# Patient Record
Sex: Female | Born: 1965 | Race: Black or African American | Hispanic: No | Smoking: Never smoker
Health system: Southern US, Community
[De-identification: ages and names within clinical notes are randomized; demographics above are authoritative.]

## PROBLEM LIST (undated history)

## (undated) DIAGNOSIS — R0683 Snoring: Secondary | ICD-10-CM

## (undated) DIAGNOSIS — R809 Proteinuria, unspecified: Secondary | ICD-10-CM

## (undated) DIAGNOSIS — N8003 Adenomyosis of the uterus: Secondary | ICD-10-CM

## (undated) DIAGNOSIS — Z8742 Personal history of other diseases of the female genital tract: Secondary | ICD-10-CM

## (undated) DIAGNOSIS — K219 Gastro-esophageal reflux disease without esophagitis: Secondary | ICD-10-CM

## (undated) DIAGNOSIS — N200 Calculus of kidney: Secondary | ICD-10-CM

## (undated) DIAGNOSIS — G4730 Sleep apnea, unspecified: Secondary | ICD-10-CM

## (undated) DIAGNOSIS — T7840XA Allergy, unspecified, initial encounter: Secondary | ICD-10-CM

## (undated) DIAGNOSIS — E785 Hyperlipidemia, unspecified: Secondary | ICD-10-CM

## (undated) DIAGNOSIS — N8000 Endometriosis of the uterus, unspecified: Secondary | ICD-10-CM

## (undated) DIAGNOSIS — E119 Type 2 diabetes mellitus without complications: Secondary | ICD-10-CM

## (undated) DIAGNOSIS — Z8639 Personal history of other endocrine, nutritional and metabolic disease: Secondary | ICD-10-CM

## (undated) DIAGNOSIS — N63 Unspecified lump in unspecified breast: Secondary | ICD-10-CM

## (undated) DIAGNOSIS — Z87442 Personal history of urinary calculi: Secondary | ICD-10-CM

## (undated) DIAGNOSIS — R7303 Prediabetes: Secondary | ICD-10-CM

## (undated) DIAGNOSIS — E559 Vitamin D deficiency, unspecified: Secondary | ICD-10-CM

## (undated) DIAGNOSIS — K449 Diaphragmatic hernia without obstruction or gangrene: Secondary | ICD-10-CM

## (undated) DIAGNOSIS — I10 Essential (primary) hypertension: Secondary | ICD-10-CM

## (undated) HISTORY — DX: Hyperlipidemia, unspecified: E78.5

## (undated) HISTORY — DX: Snoring: R06.83

## (undated) HISTORY — DX: Calculus of kidney: N20.0

## (undated) HISTORY — DX: Proteinuria, unspecified: R80.9

## (undated) HISTORY — DX: Essential (primary) hypertension: I10

## (undated) HISTORY — DX: Type 2 diabetes mellitus without complications: E11.9

## (undated) HISTORY — PX: BREAST REDUCTION SURGERY: SHX8

## (undated) HISTORY — DX: Allergy, unspecified, initial encounter: T78.40XA

## (undated) HISTORY — DX: Sleep apnea, unspecified: G47.30

## (undated) HISTORY — DX: Gastro-esophageal reflux disease without esophagitis: K21.9

## (undated) HISTORY — DX: Personal history of other diseases of the female genital tract: Z87.42

## (undated) HISTORY — DX: Vitamin D deficiency, unspecified: E55.9

## (undated) HISTORY — PX: REDUCTION MAMMAPLASTY: SUR839

## (undated) HISTORY — DX: Diaphragmatic hernia without obstruction or gangrene: K44.9

---

## 1988-08-03 HISTORY — PX: BREAST SURGERY: SHX581

## 2005-12-01 HISTORY — PX: BREAST BIOPSY: SHX20

## 2007-07-26 LAB — HM PAP SMEAR: HM Pap smear: NORMAL

## 2011-03-04 HISTORY — PX: OOPHORECTOMY: SHX86

## 2012-03-03 HISTORY — PX: ABDOMINAL HYSTERECTOMY: SHX81

## 2014-03-03 LAB — HM DIABETES EYE EXAM

## 2014-05-29 DIAGNOSIS — R809 Proteinuria, unspecified: Secondary | ICD-10-CM

## 2014-05-29 HISTORY — DX: Proteinuria, unspecified: R80.9

## 2014-05-31 ENCOUNTER — Ambulatory Visit: Payer: Self-pay | Admitting: Family Medicine

## 2014-06-07 ENCOUNTER — Ambulatory Visit: Payer: Self-pay | Admitting: Family Medicine

## 2014-08-16 ENCOUNTER — Ambulatory Visit: Payer: Self-pay | Admitting: Family Medicine

## 2014-08-16 LAB — HM MAMMOGRAPHY

## 2014-09-11 ENCOUNTER — Ambulatory Visit: Payer: Self-pay | Admitting: Family Medicine

## 2014-09-12 DIAGNOSIS — G4733 Obstructive sleep apnea (adult) (pediatric): Secondary | ICD-10-CM | POA: Insufficient documentation

## 2014-09-12 DIAGNOSIS — G4730 Sleep apnea, unspecified: Secondary | ICD-10-CM | POA: Insufficient documentation

## 2014-12-06 LAB — LIPID PANEL
CHOLESTEROL: 145 mg/dL (ref 0–200)
HDL: 34 mg/dL — AB (ref 35–70)
LDL Cholesterol: 85 mg/dL
TRIGLYCERIDES: 130 mg/dL (ref 40–160)

## 2014-12-06 LAB — HEMOGLOBIN A1C: HEMOGLOBIN A1C: 6.2 % — AB (ref 4.0–6.0)

## 2015-03-09 ENCOUNTER — Encounter: Payer: Self-pay | Admitting: Family Medicine

## 2015-03-09 DIAGNOSIS — K449 Diaphragmatic hernia without obstruction or gangrene: Secondary | ICD-10-CM | POA: Insufficient documentation

## 2015-03-09 DIAGNOSIS — E668 Other obesity: Secondary | ICD-10-CM | POA: Insufficient documentation

## 2015-03-09 DIAGNOSIS — Z862 Personal history of diseases of the blood and blood-forming organs and certain disorders involving the immune mechanism: Secondary | ICD-10-CM | POA: Insufficient documentation

## 2015-03-09 DIAGNOSIS — E559 Vitamin D deficiency, unspecified: Secondary | ICD-10-CM | POA: Insufficient documentation

## 2015-03-09 DIAGNOSIS — E119 Type 2 diabetes mellitus without complications: Secondary | ICD-10-CM

## 2015-03-09 DIAGNOSIS — L83 Acanthosis nigricans: Secondary | ICD-10-CM

## 2015-03-09 DIAGNOSIS — N200 Calculus of kidney: Secondary | ICD-10-CM | POA: Insufficient documentation

## 2015-03-09 DIAGNOSIS — Z90710 Acquired absence of both cervix and uterus: Secondary | ICD-10-CM | POA: Insufficient documentation

## 2015-03-09 DIAGNOSIS — J302 Other seasonal allergic rhinitis: Secondary | ICD-10-CM | POA: Insufficient documentation

## 2015-03-09 DIAGNOSIS — N8000 Endometriosis of the uterus, unspecified: Secondary | ICD-10-CM | POA: Insufficient documentation

## 2015-03-09 DIAGNOSIS — J309 Allergic rhinitis, unspecified: Secondary | ICD-10-CM

## 2015-03-09 DIAGNOSIS — E785 Hyperlipidemia, unspecified: Secondary | ICD-10-CM | POA: Insufficient documentation

## 2015-03-09 DIAGNOSIS — E1129 Type 2 diabetes mellitus with other diabetic kidney complication: Secondary | ICD-10-CM | POA: Insufficient documentation

## 2015-03-09 DIAGNOSIS — I10 Essential (primary) hypertension: Secondary | ICD-10-CM | POA: Insufficient documentation

## 2015-03-09 DIAGNOSIS — K219 Gastro-esophageal reflux disease without esophagitis: Secondary | ICD-10-CM | POA: Insufficient documentation

## 2015-03-09 HISTORY — DX: Vitamin D deficiency, unspecified: E55.9

## 2015-03-09 HISTORY — DX: Acanthosis nigricans: L83

## 2015-03-09 HISTORY — DX: Personal history of diseases of the blood and blood-forming organs and certain disorders involving the immune mechanism: Z86.2

## 2015-03-09 HISTORY — DX: Type 2 diabetes mellitus without complications: E11.9

## 2015-03-09 HISTORY — DX: Diaphragmatic hernia without obstruction or gangrene: K44.9

## 2015-03-09 HISTORY — DX: Allergic rhinitis, unspecified: J30.9

## 2015-03-14 ENCOUNTER — Ambulatory Visit (INDEPENDENT_AMBULATORY_CARE_PROVIDER_SITE_OTHER): Payer: Managed Care, Other (non HMO) | Admitting: Family Medicine

## 2015-03-14 ENCOUNTER — Encounter (INDEPENDENT_AMBULATORY_CARE_PROVIDER_SITE_OTHER): Payer: Self-pay

## 2015-03-14 ENCOUNTER — Encounter: Payer: Self-pay | Admitting: Family Medicine

## 2015-03-14 ENCOUNTER — Other Ambulatory Visit: Payer: Self-pay

## 2015-03-14 VITALS — BP 124/82 | HR 74 | Temp 99.0°F | Resp 16 | Ht 62.0 in | Wt 256.5 lb

## 2015-03-14 DIAGNOSIS — R809 Proteinuria, unspecified: Secondary | ICD-10-CM

## 2015-03-14 DIAGNOSIS — E785 Hyperlipidemia, unspecified: Secondary | ICD-10-CM | POA: Diagnosis not present

## 2015-03-14 DIAGNOSIS — E1129 Type 2 diabetes mellitus with other diabetic kidney complication: Secondary | ICD-10-CM

## 2015-03-14 DIAGNOSIS — E668 Other obesity: Secondary | ICD-10-CM

## 2015-03-14 DIAGNOSIS — K219 Gastro-esophageal reflux disease without esophagitis: Secondary | ICD-10-CM

## 2015-03-14 DIAGNOSIS — I10 Essential (primary) hypertension: Secondary | ICD-10-CM | POA: Diagnosis not present

## 2015-03-14 LAB — POCT GLYCOSYLATED HEMOGLOBIN (HGB A1C): HEMOGLOBIN A1C: 6

## 2015-03-14 MED ORDER — METFORMIN HCL ER (MOD) 500 MG PO TB24
500.0000 mg | ORAL_TABLET | Freq: Every day | ORAL | Status: DC
Start: 2015-03-14 — End: 2015-03-20

## 2015-03-14 MED ORDER — METFORMIN HCL ER (MOD) 500 MG PO TB24
500.0000 mg | ORAL_TABLET | Freq: Every day | ORAL | Status: DC
Start: 2015-03-14 — End: 2015-03-14

## 2015-03-14 MED ORDER — LISINOPRIL 20 MG PO TABS
20.0000 mg | ORAL_TABLET | Freq: Every day | ORAL | Status: DC
Start: 2015-03-14 — End: 2015-10-25

## 2015-03-14 MED ORDER — OMEPRAZOLE 20 MG PO CPDR
20.0000 mg | DELAYED_RELEASE_CAPSULE | Freq: Every day | ORAL | Status: DC
Start: 2015-03-14 — End: 2016-06-17

## 2015-03-14 MED ORDER — ROSUVASTATIN CALCIUM 20 MG PO TABS: 20.0000 mg | ORAL_TABLET | Freq: Every day | ORAL | Status: DC

## 2015-03-14 NOTE — Progress Notes (Signed)
Name: Debra Contreras   MRN: [id number]    DOB: 06-Mar-1966   Date:03/14/2015       Progress Note  Subjective  Chief Complaint  Chief Complaint  Patient presents with  . Diabetes  . Hypertension  . Hyperlipidemia  . Gastrophageal Reflux    HPI  DMII with renal manifestation: she is doing well, compliant with medication on Metformin and Lisinopril. She is not checking glucose at home. Due for eye exam  Obesity: she has changed her diet, and now has a Physiological scientist.  Starting weight on April 9th, 2016 was 274.4 lbs. She feels less tired.   HTN: taking lisinopril for bp and proteinuria and is doing well, denies chest pain or palpitation  GERD: still taking Omeprazole, she has been taking for many years, she is skipping on weekends, but is willing to try stop taking medication. [place] controlled but re-starts when she skips medication for a couple of days  OSA: no using CPAP machine because she can't afford CPAP machine   Patient Active Problem List   Diagnosis Date Noted  . Acanthosis nigricans 03/09/2015  . Allergic rhinitis, seasonal 03/09/2015  . Diabetes mellitus with renal manifestation 03/09/2015  . Dyslipidemia 03/09/2015  . Gastro-esophageal reflux disease without esophagitis 03/09/2015  . Hiatal hernia 03/09/2015  . History of anemia 03/09/2015  . H/O: hysterectomy 03/09/2015  . H/O female genital system disorder 03/09/2015  . Calculus of kidney 03/09/2015  . Benign hypertension 03/09/2015  . Extreme obesity 03/09/2015  . Vitamin D deficiency 03/09/2015  . Apnea, sleep 09/12/2014  . Microalbuminuria 05/29/2014    Past Surgical History  Procedure Laterality Date  . Oophorectomy Left 03/2011  . Abdominal hysterectomy  03/2012  . Breast surgery Bilateral 1990    Reduction  . Breast biopsy Right 12/2005    Normal-Cyst    Family History  Problem Relation Age of Onset  . Hypertension Mother   . Cirrhosis Mother   . Alcohol abuse Father   . Heart disease  Father   . Diabetes Brother     Social History   Social History  . Marital Status: Single    Spouse Name: N/A  . Number of Children: N/A  . Years of Education: N/A   Occupational History  . Not on file.   Social History Main Topics  . Smoking status: Never Smoker   . Smokeless tobacco: Never Used  . Alcohol Use: 0.0 oz/week    0 Standard drinks or equivalent per week  . Drug Use: No  . Sexual Activity: No   Other Topics Concern  . Not on file   Social History Narrative     Current outpatient prescriptions:  .  aspirin 81 MG chewable tablet, Chew 1 tablet by mouth daily., Disp: , Rfl:  .  Cholecalciferol (VITAMIN D) 2000 UNITS CAPS, Take 1 capsule by mouth daily., Disp: , Rfl:  .  lisinopril (PRINIVIL,ZESTRIL) 20 MG tablet, Take 1 tablet (20 mg total) by mouth daily., Disp: 30 tablet, Rfl: 5 .  loratadine (CLARITIN) 10 MG tablet, Take 21 tablets by mouth daily., Disp: , Rfl:  .  metFORMIN (GLUMETZA) 500 MG (MOD) 24 hr tablet, Take 1 tablet (500 mg total) by mouth daily., Disp: 30 tablet, Rfl: 5 .  omeprazole (PRILOSEC) 20 MG capsule, Take 1 capsule (20 mg total) by mouth daily., Disp: 30 capsule, Rfl: 2 .  rosuvastatin (CRESTOR) 20 MG tablet, Take 1 tablet (20 mg total) by mouth daily., Disp: 30 tablet, Rfl: 5  Allergies  Allergen Reactions  . Latex Rash     ROS  Constitutional: Negative for fever positive for  weight change.  Respiratory: Negative for cough and shortness of breath.   Cardiovascular: Negative for chest pain or palpitations.  Gastrointestinal: Negative for abdominal pain, no bowel changes.  Musculoskeletal: Negative for gait problem or joint swelling.  Skin: Negative for rash.  Neurological: Negative for dizziness or headache.  No other specific complaints in a complete review of systems (except as listed in HPI above).  Objective  Filed Vitals:   03/14/15 0821  BP: 124/82  Pulse: 74  Temp: 99 F (37.2 C)  TempSrc: Oral  Resp: 16   Height: 5\' 2"  (1.575 m)  Weight: 256 lb 8 oz (116.348 kg)  SpO2: 97%    Body mass index is 46.9 kg/(m^2).  Physical Exam  Constitutional: Patient appears well-developed and well-nourished. Obese No distress.  HEENT: head atraumatic, normocephalic, pupils equal and reactive to light, neck supple, throat within normal limits Cardiovascular: Normal rate, regular rhythm and normal heart sounds.  No murmur heard. No BLE edema. Pulmonary/Chest: Effort normal and breath sounds normal. No respiratory distress. Abdominal: Soft.  There is no tenderness. Psychiatric: Patient has a normal mood and affect. behavior is normal. Judgment and thought content normal.    Diabetic Foot Exam: Diabetic Foot Exam - Simple   Simple Foot Form  Visual Inspection  No deformities, no ulcerations, no other skin breakdown bilaterally:  Yes  Sensation Testing  Intact to touch and monofilament testing bilaterally:  Yes  Pulse Check  Posterior Tibialis and Dorsalis pulse intact bilaterally:  Yes  Comments      PHQ2/9: Depression screen PHQ 2/9 03/14/2015  Decreased Interest 0  Down, Depressed, Hopeless 0  PHQ - 2 Score 0     Fall Risk: Fall Risk  03/14/2015  Falls in the past year? No      Assessment & Plan  1. Type 2 diabetes mellitus with other diabetic kidney complication Doing well, denies side effects of medication, she is losing weight, her goal is to stop taking Metformin, advised not to stop at this time, since Metformin helps with weight loss, needs eye exam yearly, due now. Continue lisinopril for microalbuminuria  - POCT HgB A1C - metFORMIN (GLUMETZA) 500 MG (MOD) 24 hr tablet; Take 1 tablet (500 mg total) by mouth daily.  Dispense: 30 tablet; Refill: 5  2. Dyslipidemia  - rosuvastatin (CRESTOR) 20 MG tablet; Take 1 tablet (20 mg total) by mouth daily.  Dispense: 30 tablet; Refill: 5  3. Microalbuminuria Continue ACE - lisinopril (PRINIVIL,ZESTRIL) 20 MG tablet; Take 1 tablet  (20 mg total) by mouth daily.  Dispense: 30 tablet; Refill: 5  4. Benign hypertension At goal  - lisinopril (PRINIVIL,ZESTRIL) 20 MG tablet; Take 1 tablet (20 mg total) by mouth daily.  Dispense: 30 tablet; Refill: 5  5. Extreme obesity Doing well, she changed her diet and recently hired a Physiological scientist, her starting weight on April 9th, 2016 was 274.4 lbs.  6. Gastro-esophageal reflux disease without esophagitis Try to wean self off - omeprazole (PRILOSEC) 20 MG capsule; Take 1 capsule (20 mg total) by mouth daily.  Dispense: 30 capsule; Refill: 2

## 2015-03-14 NOTE — Telephone Encounter (Signed)
Patient requesting refill. 

## 2015-03-19 ENCOUNTER — Telehealth: Payer: Self-pay | Admitting: Family Medicine

## 2015-03-19 NOTE — Telephone Encounter (Signed)
PT SAID THAT A WRONG MEDICATION WAS SENT IN ON HER FOR LAST Thursday and that the patient was to have received Metformin but was not sent in . The wrong rx was sent in for it. Please refill the Metformin for the patient is almost out. Phamr is [facility] on [address].

## 2015-03-20 MED ORDER — METFORMIN HCL ER (OSM) 500 MG PO TB24: 500.0000 mg | ORAL_TABLET | Freq: Every day | ORAL | Status: DC

## 2015-03-20 NOTE — Telephone Encounter (Signed)
Please call pharmacy, I sent metformin ( glumetza ) please verify with pharmacy what was wrong about it. Thank you

## 2015-03-20 NOTE — Telephone Encounter (Signed)
It was call in as the Brand name Glumetza instead of what she has been on Metformin HCI ER 500 mg, but the pharmacist stated she would take a verbal order to change the order back to what she has been on. The different was the cost is much higher with the Glumetza has a different formulary they stated and it jacks the prices up for the patient.

## 2015-03-20 NOTE — Addendum Note (Signed)
Addended by: Steele Sizer F on: 03/20/2015 04:40 PM   Modules accepted: Orders, Medications

## 2015-05-16 ENCOUNTER — Encounter: Payer: Self-pay | Admitting: Family Medicine

## 2015-05-17 ENCOUNTER — Ambulatory Visit (INDEPENDENT_AMBULATORY_CARE_PROVIDER_SITE_OTHER): Payer: Managed Care, Other (non HMO) | Admitting: Family Medicine

## 2015-05-17 ENCOUNTER — Encounter: Payer: Self-pay | Admitting: Family Medicine

## 2015-05-17 VITALS — BP 134/76 | HR 90 | Temp 98.9°F | Resp 18 | Ht 62.0 in | Wt 260.4 lb

## 2015-05-17 DIAGNOSIS — E559 Vitamin D deficiency, unspecified: Secondary | ICD-10-CM

## 2015-05-17 DIAGNOSIS — G4730 Sleep apnea, unspecified: Secondary | ICD-10-CM

## 2015-05-17 DIAGNOSIS — N63 Unspecified lump in unspecified breast: Secondary | ICD-10-CM | POA: Insufficient documentation

## 2015-05-17 DIAGNOSIS — R5383 Other fatigue: Secondary | ICD-10-CM | POA: Diagnosis not present

## 2015-05-17 DIAGNOSIS — E041 Nontoxic single thyroid nodule: Secondary | ICD-10-CM | POA: Diagnosis not present

## 2015-05-17 NOTE — Progress Notes (Signed)
Name: Debra Contreras   MRN: [id number]    DOB: 1965/11/13   Date:05/17/2015       Progress Note  Subjective  Chief Complaint  Chief Complaint  Patient presents with  . Mass    thyroid feels swollen, very fatigue, hot/cold flashes, body aches, choking feeling,cough, and having to clear throat    HPI  Thyroid nodule: she was sitting on her desk working on a report and felt a large nodule on the left side, she also has fatigue, hot flashes, has to clear her throat, and chokes when she raises her arms. No hair loss, some constipation is coming back again, no palpitation  Fatigue: extreme fatigue, feels like when she had anemia, prior to hysterectomy. Falling asleep while talking on the phone, no SOB. Not using CPAP machine because it was too expensive.  She was working out but unable to go to the gym to see her personal trainer over the past couple of weeks.   Breast lump: had diagnostic study done in January, Birads 2, but still concerned, discussed referral to surgeon, but she wants to hold off for now and call me back   Patient Active Problem List   Diagnosis Date Noted  . Thyroid nodule 05/17/2015  . Acanthosis nigricans 03/09/2015  . Allergic rhinitis, seasonal 03/09/2015  . Diabetes mellitus with renal manifestation ([place]) 03/09/2015  . Dyslipidemia 03/09/2015  . Gastro-esophageal reflux disease without esophagitis 03/09/2015  . Hiatal hernia 03/09/2015  . History of anemia 03/09/2015  . H/O: hysterectomy 03/09/2015  . H/O female genital system disorder 03/09/2015  . Calculus of kidney 03/09/2015  . Benign hypertension 03/09/2015  . Extreme obesity ([place]) 03/09/2015  . Vitamin D deficiency 03/09/2015  . Apnea, sleep 09/12/2014  . Microalbuminuria 05/29/2014    Past Surgical History  Procedure Laterality Date  . Oophorectomy Left 03/2011  . Abdominal hysterectomy  03/2012  . Breast surgery Bilateral 1990    Reduction  . Breast biopsy Right 12/2005    Normal-Cyst     Family History  Problem Relation Age of Onset  . Hypertension Mother   . Cirrhosis Mother   . Alcohol abuse Father   . Heart disease Father   . Diabetes Brother     Social History   Social History  . Marital Status: Single    Spouse Name: N/A  . Number of Children: N/A  . Years of Education: N/A   Occupational History  . Not on file.   Social History Main Topics  . Smoking status: Never Smoker   . Smokeless tobacco: Never Used  . Alcohol Use: 0.0 oz/week    0 Standard drinks or equivalent per week  . Drug Use: No  . Sexual Activity: No   Other Topics Concern  . Not on file   Social History Narrative     Current outpatient prescriptions:  .  aspirin 81 MG chewable tablet, Chew 1 tablet by mouth daily., Disp: , Rfl:  .  Cholecalciferol (VITAMIN D) 2000 UNITS CAPS, Take 1 capsule by mouth daily., Disp: , Rfl:  .  lisinopril (PRINIVIL,ZESTRIL) 20 MG tablet, Take 1 tablet (20 mg total) by mouth daily., Disp: 30 tablet, Rfl: 5 .  metformin (FORTAMET) 500 MG (OSM) 24 hr tablet, Take 1 tablet (500 mg total) by mouth daily with breakfast., Disp: 90 tablet, Rfl: 1 .  omeprazole (PRILOSEC) 20 MG capsule, Take 1 capsule (20 mg total) by mouth daily., Disp: 30 capsule, Rfl: 2 .  rosuvastatin (CRESTOR) 20 MG tablet,  Take 1 tablet (20 mg total) by mouth daily., Disp: 30 tablet, Rfl: 5  Allergies  Allergen Reactions  . Latex Rash     ROS  Constitutional: Negative for fever or weight change.  Respiratory: positive for cough ( had a cold about one month ago, almost resolved )  and shortness of breath.   Cardiovascular: Negative for chest pain or palpitations.  Gastrointestinal: Negative for abdominal pain, used to have constipation, improved since April 2016 with life style modification  but back again over the past couple of weeks Musculoskeletal: Negative for gait problem or joint swelling.  Skin: Positive for rash - eczema around ears and cheeks   Neurological:  Negative for dizziness or headache.  No other specific complaints in a complete review of systems (except as listed in HPI above).  Objective  Filed Vitals:   05/17/15 1208  BP: 134/76  Pulse: 90  Temp: 98.9 F (37.2 C)  TempSrc: Oral  Resp: 18  Height: 5\' 2"  (1.575 m)  Weight: 260 lb 6.4 oz (118.117 kg)  SpO2: 96%    Body mass index is 47.62 kg/(m^2).  Physical Exam  Constitutional: Patient appears well-developed and well-nourished. Obese No distress.  HEENT: head atraumatic, normocephalic, pupils equal and reactive to light, neck supple, throat within normal limits Cardiovascular: Normal rate, regular rhythm and normal heart sounds. No murmur heard. No BLE edema. Pulmonary/Chest: Effort normal and breath sounds normal. No respiratory distress. Abdominal: Soft. There is no tenderness. Psychiatric: Patient has a normal mood and affect. behavior is normal. Judgment and thought content normal.  Recent Results (from the past 2160 hour(s))  POCT HgB A1C     Status: Normal   Collection Time: 03/14/15  8:52 AM  Result Value Ref Range   Hemoglobin A1C 6.0      PHQ2/9: Depression screen [facility] 2/9 05/17/2015 03/14/2015  Decreased Interest 0 0  Down, Depressed, Hopeless 0 0  PHQ - 2 Score 0 0    Fall Risk: Fall Risk  05/17/2015 03/14/2015  Falls in the past year? No No     Functional Status Survey: Is the patient deaf or have difficulty hearing?: No Does the patient have difficulty seeing, even when wearing glasses/contacts?: No Does the patient have difficulty concentrating, remembering, or making decisions?: No Does the patient have difficulty walking or climbing stairs?: No Does the patient have difficulty dressing or bathing?: No Does the patient have difficulty doing errands alone such as visiting a doctor's office or shopping?: No    Assessment & Plan  1. Thyroid nodule  Large on left side, get [place] and thyroid studies - Thyroid Panel With TSH - [place] Soft  Tissue Head/Neck; Future  2. Other fatigue  Explained likely from Sleep Apnea and needs to get CPAP supplies ASAP - CBC with Differential/Platelet  3. Vitamin D deficiency  She wants to hold off on labs at this time  4. Breast lump in female  Advised referral to surgeon, she will think about it for now  5. Apnea, sleep  Must start using CPAP

## 2015-05-18 LAB — CBC WITH DIFFERENTIAL/PLATELET
Basophils Absolute: 0 10*3/uL (ref 0.0–0.2)
Basos: 0 %
EOS (ABSOLUTE): 0.1 10*3/uL (ref 0.0–0.4)
Eos: 3 %
Hematocrit: 37.5 % (ref 34.0–46.6)
Hemoglobin: 12.3 g/dL (ref 11.1–15.9)
IMMATURE GRANS (ABS): 0 10*3/uL (ref 0.0–0.1)
Immature Granulocytes: 0 %
Lymphocytes Absolute: 2.9 10*3/uL (ref 0.7–3.1)
Lymphs: 64 %
MCH: 28.4 pg (ref 26.6–33.0)
MCHC: 32.8 g/dL (ref 31.5–35.7)
MCV: 87 fL (ref 79–97)
Monocytes Absolute: 0.4 10*3/uL (ref 0.1–0.9)
Monocytes: 8 %
NEUTROS ABS: 1.1 10*3/uL — AB (ref 1.4–7.0)
Neutrophils: 25 %
PLATELETS: 325 10*3/uL (ref 150–379)
RBC: 4.33 x10E6/uL (ref 3.77–5.28)
RDW: 14.8 % (ref 12.3–15.4)
WBC: 4.5 10*3/uL (ref 3.4–10.8)

## 2015-05-18 LAB — THYROID PANEL WITH TSH
FREE THYROXINE INDEX: 1.7 (ref 1.2–4.9)
T3 UPTAKE RATIO: 24 % (ref 24–39)
T4 TOTAL: 7.1 ug/dL (ref 4.5–12.0)
TSH: 2.02 u[IU]/mL (ref 0.450–4.500)

## 2015-05-21 ENCOUNTER — Ambulatory Visit
Admission: RE | Admit: 2015-05-21 | Discharge: 2015-05-21 | Disposition: A | Discharge status: Home / Self Care | Payer: Managed Care, Other (non HMO) | Source: Ambulatory Visit | Attending: Family Medicine | Admitting: Family Medicine

## 2015-05-21 DIAGNOSIS — E041 Nontoxic single thyroid nodule: Secondary | ICD-10-CM | POA: Insufficient documentation

## 2015-05-22 ENCOUNTER — Other Ambulatory Visit: Payer: Self-pay | Admitting: Family Medicine

## 2015-05-22 DIAGNOSIS — E041 Nontoxic single thyroid nodule: Secondary | ICD-10-CM

## 2015-06-04 ENCOUNTER — Encounter: Payer: Self-pay | Admitting: Family Medicine

## 2015-06-04 ENCOUNTER — Ambulatory Visit (INDEPENDENT_AMBULATORY_CARE_PROVIDER_SITE_OTHER): Payer: Managed Care, Other (non HMO) | Admitting: Family Medicine

## 2015-06-04 VITALS — BP 104/78 | HR 81 | Temp 98.6°F | Resp 16 | Ht 62.0 in | Wt 257.3 lb

## 2015-06-04 DIAGNOSIS — I10 Essential (primary) hypertension: Secondary | ICD-10-CM | POA: Diagnosis not present

## 2015-06-04 DIAGNOSIS — H60543 Acute eczematoid otitis externa, bilateral: Secondary | ICD-10-CM

## 2015-06-04 DIAGNOSIS — Z0000 Encounter for general adult medical examination without abnormal findings: Secondary | ICD-10-CM

## 2015-06-04 DIAGNOSIS — N907 Vulvar cyst: Secondary | ICD-10-CM | POA: Diagnosis not present

## 2015-06-04 DIAGNOSIS — E785 Hyperlipidemia, unspecified: Secondary | ICD-10-CM

## 2015-06-04 DIAGNOSIS — Z1239 Encounter for other screening for malignant neoplasm of breast: Secondary | ICD-10-CM | POA: Diagnosis not present

## 2015-06-04 DIAGNOSIS — Z01419 Encounter for gynecological examination (general) (routine) without abnormal findings: Secondary | ICD-10-CM

## 2015-06-04 HISTORY — DX: Morbid (severe) obesity due to excess calories: E66.01

## 2015-06-04 MED ORDER — HYDROCORTISONE 1 % RE CREA: 1.0000 g | TOPICAL_CREAM | Freq: Two times a day (BID) | RECTAL | Status: DC

## 2015-06-04 NOTE — Addendum Note (Signed)
Addended by: Steele Sizer F on: 06/04/2015 09:17 AM   Modules accepted: Orders, SmartSet

## 2015-06-04 NOTE — Progress Notes (Addendum)
Name: Debra Contreras   MRN: [id number]    DOB: 20-Oct-1965   Date:06/04/2015       Progress Note  Subjective  Chief Complaint  Chief Complaint  Patient presents with  . Annual Exam    HPI  Well Woman: she is seeing Endo , she has a thyroid mass and when she raises her left arm, she has some SOB. Also feeling tired for the past month, weight has been stable - she states she will try to increase her physical activity.  She is skipping Omeprazole, but has symptoms during the weekend. Advised to change the days she is taking it to cover weekend. She denies vaginal bleeding. She denies symptoms of urinary incontinence.   Patient Active Problem List   Diagnosis Date Noted  . Thyroid nodule 05/17/2015  . Breast lump in female 05/17/2015  . Acanthosis nigricans 03/09/2015  . Allergic rhinitis, seasonal 03/09/2015  . Diabetes mellitus with renal manifestation ([place]) 03/09/2015  . Dyslipidemia 03/09/2015  . Gastro-esophageal reflux disease without esophagitis 03/09/2015  . Hiatal hernia 03/09/2015  . History of anemia 03/09/2015  . H/O: hysterectomy 03/09/2015  . H/O female genital system disorder 03/09/2015  . Calculus of kidney 03/09/2015  . Benign hypertension 03/09/2015  . Extreme obesity ([place]) 03/09/2015  . Vitamin D deficiency 03/09/2015  . Apnea, sleep 09/12/2014  . Microalbuminuria 05/29/2014    Past Surgical History  Procedure Laterality Date  . Oophorectomy Left 03/2011  . Abdominal hysterectomy  03/2012  . Breast surgery Bilateral 1990    Reduction  . Breast biopsy Right 12/2005    Normal-Cyst    Family History  Problem Relation Age of Onset  . Hypertension Mother   . Cirrhosis Mother   . Alcohol abuse Father   . Heart disease Father   . Diabetes Brother     Social History   Social History  . Marital Status: Single    Spouse Name: N/A  . Number of Children: N/A  . Years of Education: N/A   Occupational History  . Not on file.   Social History Main  Topics  . Smoking status: Never Smoker   . Smokeless tobacco: Never Used  . Alcohol Use: 0.0 oz/week    0 Standard drinks or equivalent per week  . Drug Use: No  . Sexual Activity: No   Other Topics Concern  . Not on file   Social History Narrative     Current outpatient prescriptions:  .  aspirin 81 MG chewable tablet, Chew 1 tablet by mouth daily., Disp: , Rfl:  .  Cholecalciferol (VITAMIN D) 2000 UNITS CAPS, Take 1 capsule by mouth daily., Disp: , Rfl:  .  lisinopril (PRINIVIL,ZESTRIL) 20 MG tablet, Take 1 tablet (20 mg total) by mouth daily., Disp: 30 tablet, Rfl: 5 .  metformin (FORTAMET) 500 MG (OSM) 24 hr tablet, Take 1 tablet (500 mg total) by mouth daily with breakfast., Disp: 90 tablet, Rfl: 1 .  omeprazole (PRILOSEC) 20 MG capsule, Take 1 capsule (20 mg total) by mouth daily., Disp: 30 capsule, Rfl: 2 .  rosuvastatin (CRESTOR) 20 MG tablet, Take 1 tablet (20 mg total) by mouth daily., Disp: 30 tablet, Rfl: 5  Allergies  Allergen Reactions  . Latex Rash     ROS  Constitutional: Negative for fever or significant weight change - but has lost 3 lbs since last visit  Respiratory: Negative for cough and shortness of breath.   Cardiovascular: Negative for chest pain or palpitations.  Gastrointestinal: Negative for  abdominal pain, no bowel changes.  Musculoskeletal: Negative for gait problem or joint swelling.  Skin: Negative for rash.  Neurological: Negative for dizziness or headache.  No other specific complaints in a complete review of systems (except as listed in HPI above).  Objective  Filed Vitals:   06/04/15 0815  BP: 104/78  Pulse: 81  Temp: 98.6 F (37 C)  TempSrc: Oral  Resp: 16  Height: 5\' 2"  (1.575 m)  Weight: 257 lb 4.8 oz (116.711 kg)  SpO2: 96%    Body mass index is 47.05 kg/(m^2).  Physical Exam  Constitutional: Patient appears well-developed and obese  No distress.  HENT: Head: Normocephalic and atraumatic. Ears: B TMs ok, no erythema or  effusion; Nose: Nose normal. Mouth/Throat: Oropharynx is clear and moist. No oropharyngeal exudate.  Eyes: Conjunctivae and EOM are normal. Pupils are equal, round, and reactive to light. No scleral icterus.  Neck: Normal range of motion. Neck supple. No JVD present. Large left thyroid mass, non tender.  Cardiovascular: Normal rate, regular rhythm and normal heart sounds.  No murmur heard. No BLE edema. Pulmonary/Chest: Effort normal and breath sounds normal. No respiratory distress. Abdominal: Soft. Bowel sounds are normal, no distension. There is no tenderness. no masses Breast:  no nipple discharge or rashes, pea size lump on right outer upper quadrant - present last year and had diagnostic US - benign cyst. Some keloid from breast reduction surgery  FEMALE GENITALIA:  External genitalia normal, a very small right vulva cyst, non -tender no drainage External urethra normal Bimanual exam normal without masses RECTAL: normal external exam Musculoskeletal: Normal range of motion, no joint effusions. No gross deformities Neurological: he is alert and oriented to person, place, and time. No cranial nerve deficit. Coordination, balance, strength, speech and gait are normal.  Skin: Skin is warm and dry. Rash on both outer ears, dry and scaly. No erythema.  Acanthosis nigricans and hypertrichosis.  Psychiatric: Patient has a normal mood and affect. behavior is normal. Judgment and thought content normal.  Recent Results (from the past 2160 hour(s))  POCT HgB A1C     Status: Normal   Collection Time: 03/14/15  8:52 AM  Result Value Ref Range   Hemoglobin A1C 6.0   CBC with Differential/Platelet     Status: Abnormal   Collection Time: 05/17/15 12:47 PM  Result Value Ref Range   WBC 4.5 3.4 - 10.8 x10E3/uL   RBC 4.33 3.77 - 5.28 x10E6/uL   Hemoglobin 12.3 11.1 - 15.9 g/dL   Hematocrit 37.5 34.0 - 46.6 %   MCV 87 79 - 97 fL   MCH 28.4 26.6 - 33.0 pg   MCHC 32.8 31.5 - 35.7 g/dL   RDW 14.8 12.3  - 15.4 %   Platelets 325 150 - 379 x10E3/uL   Neutrophils 25 %   Lymphs 64 %   Monocytes 8 %   Eos 3 %   Basos 0 %   Neutrophils Absolute 1.1 (L) 1.4 - 7.0 x10E3/uL   Lymphocytes Absolute 2.9 0.7 - 3.1 x10E3/uL   Monocytes Absolute 0.4 0.1 - 0.9 x10E3/uL   EOS (ABSOLUTE) 0.1 0.0 - 0.4 x10E3/uL   Basophils Absolute 0.0 0.0 - 0.2 x10E3/uL   Immature Granulocytes 0 %   Immature Grans (Abs) 0.0 0.0 - 0.1 x10E3/uL  Thyroid Panel With TSH     Status: None   Collection Time: 05/17/15 12:47 PM  Result Value Ref Range   TSH 2.020 0.450 - 4.500 uIU/mL   T4, Total  7.1 4.5 - 12.0 ug/dL   T3 Uptake Ratio 24 24 - 39 %   Free Thyroxine Index 1.7 1.2 - 4.9     PHQ2/9: Depression screen [facility] 2/9 05/17/2015 03/14/2015  Decreased Interest 0 0  Down, Depressed, Hopeless 0 0  PHQ - 2 Score 0 0    Fall Risk: Fall Risk  05/17/2015 03/14/2015  Falls in the past year? No No     Assessment & Plan  1. Well woman exam  Discussed importance of 150 minutes of physical activity weekly, eat two servings of fish weekly, eat one serving of tree nuts ( cashews, pistachios, pecans, almonds.Marland Kitchen) every other day, eat 6 servings of fruit/vegetables daily and drink plenty of water and avoid sweet beverages.   2. Dyslipidemia  - Lipid panel  3. Benign hypertension  - Comprehensive metabolic panel  4. Cyst, vulva  reassurance  5. Morbid obesity due to excess calories [facility])  Discussed with the patient the risk posed by an increased BMI. Discussed importance of portion control, calorie counting and at least 150 minutes of physical activity weekly. Avoid sweet beverages and drink more water. Eat at least 6 servings of fruit and vegetables daily   6. Breast cancer screening  - MM Digital Screening; Future  7. Eczema of external ear, bilateral  She will try otc hydrocortisone cream

## 2015-06-05 LAB — COMPREHENSIVE METABOLIC PANEL
A/G RATIO: 1.6 (ref 1.1–2.5)
ALBUMIN: 4.6 g/dL (ref 3.5–5.5)
ALT: 22 IU/L (ref 0–32)
AST: 18 IU/L (ref 0–40)
Alkaline Phosphatase: 52 IU/L (ref 39–117)
BILIRUBIN TOTAL: 0.2 mg/dL (ref 0.0–1.2)
BUN / CREAT RATIO: 22 (ref 9–23)
BUN: 17 mg/dL (ref 6–24)
CHLORIDE: 97 mmol/L (ref 97–106)
CO2: 23 mmol/L (ref 18–29)
Calcium: 9.7 mg/dL (ref 8.7–10.2)
Creatinine, Ser: 0.78 mg/dL (ref 0.57–1.00)
GFR, EST AFRICAN AMERICAN: 104 mL/min/{1.73_m2} (ref >59)
GFR, EST NON AFRICAN AMERICAN: 90 mL/min/{1.73_m2} (ref >59)
GLOBULIN, TOTAL: 2.8 g/dL (ref 1.5–4.5)
Glucose: 105 mg/dL — ABNORMAL HIGH (ref 65–99)
POTASSIUM: 4.7 mmol/L (ref 3.5–5.2)
SODIUM: 137 mmol/L (ref 136–144)
TOTAL PROTEIN: 7.4 g/dL (ref 6.0–8.5)

## 2015-06-05 LAB — LIPID PANEL
CHOL/HDL RATIO: 4 ratio (ref 0.0–4.4)
Cholesterol, Total: 160 mg/dL (ref 100–199)
HDL: 40 mg/dL (ref >39)
LDL Calculated: 91 mg/dL (ref 0–99)
TRIGLYCERIDES: 144 mg/dL (ref 0–149)
VLDL Cholesterol Cal: 29 mg/dL (ref 5–40)

## 2015-06-11 ENCOUNTER — Encounter: Payer: Self-pay | Admitting: Family Medicine

## 2015-09-17 ENCOUNTER — Ambulatory Visit
Admission: RE | Admit: 2015-09-17 | Discharge: 2015-09-17 | Disposition: A | Discharge status: Home / Self Care | Payer: Managed Care, Other (non HMO) | Source: Ambulatory Visit | Attending: Family Medicine | Admitting: Family Medicine

## 2015-09-17 ENCOUNTER — Ambulatory Visit (INDEPENDENT_AMBULATORY_CARE_PROVIDER_SITE_OTHER): Payer: Managed Care, Other (non HMO) | Admitting: Family Medicine

## 2015-09-17 ENCOUNTER — Encounter: Payer: Self-pay | Admitting: Family Medicine

## 2015-09-17 VITALS — BP 134/80 | HR 91 | Temp 98.5°F | Resp 16 | Ht 62.0 in | Wt 270.5 lb

## 2015-09-17 DIAGNOSIS — Z1231 Encounter for screening mammogram for malignant neoplasm of breast: Secondary | ICD-10-CM | POA: Insufficient documentation

## 2015-09-17 DIAGNOSIS — E119 Type 2 diabetes mellitus without complications: Secondary | ICD-10-CM | POA: Diagnosis not present

## 2015-09-17 DIAGNOSIS — Z1239 Encounter for other screening for malignant neoplasm of breast: Secondary | ICD-10-CM | POA: Diagnosis not present

## 2015-09-17 DIAGNOSIS — R809 Proteinuria, unspecified: Secondary | ICD-10-CM | POA: Diagnosis not present

## 2015-09-17 DIAGNOSIS — K219 Gastro-esophageal reflux disease without esophagitis: Secondary | ICD-10-CM

## 2015-09-17 DIAGNOSIS — G4730 Sleep apnea, unspecified: Secondary | ICD-10-CM

## 2015-09-17 DIAGNOSIS — I10 Essential (primary) hypertension: Secondary | ICD-10-CM | POA: Diagnosis not present

## 2015-09-17 DIAGNOSIS — E785 Hyperlipidemia, unspecified: Secondary | ICD-10-CM

## 2015-09-17 DIAGNOSIS — E041 Nontoxic single thyroid nodule: Secondary | ICD-10-CM | POA: Diagnosis not present

## 2015-09-17 DIAGNOSIS — Z23 Encounter for immunization: Secondary | ICD-10-CM | POA: Diagnosis not present

## 2015-09-17 DIAGNOSIS — IMO0001 Reserved for inherently not codable concepts without codable children: Secondary | ICD-10-CM

## 2015-09-17 LAB — POCT UA - MICROALBUMIN: MICROALBUMIN (UR) POC: 50 mg/L

## 2015-09-17 LAB — POCT GLYCOSYLATED HEMOGLOBIN (HGB A1C): HEMOGLOBIN A1C: 6.3

## 2015-09-17 MED ORDER — ROSUVASTATIN CALCIUM 20 MG PO TABS: 20.0000 mg | ORAL_TABLET | Freq: Every day | ORAL | Status: DC

## 2015-09-17 MED ORDER — METFORMIN HCL ER (OSM) 500 MG PO TB24: 500.0000 mg | ORAL_TABLET | Freq: Every day | ORAL | Status: DC

## 2015-09-17 MED ORDER — LISINOPRIL 20 MG PO TABS: 20.0000 mg | ORAL_TABLET | Freq: Every day | ORAL | Status: DC

## 2015-09-17 NOTE — Progress Notes (Signed)
Name: Debra Contreras   MRN: [id number]    DOB: 1965-08-18   Date:09/17/2015       Progress Note  Subjective  Chief Complaint  Chief Complaint  Patient presents with  . Medication Refill    6 month F/U  . Diabetes    Patient does not check her sugar at home  . Hypertension    SOB  . Hyperlipidemia    Nausea   . Gastroesophageal Reflux    Taking every other day, well controlled for the most part will have to take a daily if her GERD flairs up     HPI  Thyroid nodule: she was sitting on her desk working on a report and felt a large nodule on the left side back in October 2016,  she also has fatigue, hot flashes, has to clear her throat, and chokes when she raises her arms. No hair loss, some constipation is coming back again, no palpitation. She has seen Endocrinologist at [facility], biopsy was negative will have surgical removal in March 2017 - for size only   DMII with renal manifestation: she is doing well, compliant with medication on Metformin and Lisinopril, urine micro today is 50  She is not checking glucose at home. Due for eye exam. She denies polyphagia, polydipsia or polyuria.  Obesity:  Starting weight on April 9th, 2016 was 274.4 lbs. She had lost a lot of weight with life style modification, she went down to 253 lbs in the Summer of 2016, she has gained most of her weight back, but she is starting to resume the changes in life style. She states there was a lot of changes at work , so she is currently unemployed. She will have surgery first and after that look for a job  HTN: taking lisinopril for bp and proteinuria and is doing well, denies chest pain or palpitation  GERD: still taking Omeprazole, she has been taking for many years, but currently taking it otc, symptoms have been controlled  OSA: not using CPAP machine because she can't afford it. Discussed importance of weight loss and risk of stroke and heart attacks.    Patient Active Problem List    Diagnosis Date Noted  . Morbid obesity ([place]) 06/04/2015  . Thyroid nodule 05/17/2015  . Breast lump in female 05/17/2015  . Acanthosis nigricans 03/09/2015  . Allergic rhinitis, seasonal 03/09/2015  . Diabetes mellitus with renal manifestation ([place]) 03/09/2015  . Dyslipidemia 03/09/2015  . Gastro-esophageal reflux disease without esophagitis 03/09/2015  . Hiatal hernia 03/09/2015  . History of anemia 03/09/2015  . H/O: hysterectomy 03/09/2015  . Uterus, adenomyosis 03/09/2015  . Calculus of kidney 03/09/2015  . Benign hypertension 03/09/2015  . Extreme obesity ([place]) 03/09/2015  . Vitamin D deficiency 03/09/2015  . Apnea, sleep 09/12/2014  . Microalbuminuria 05/29/2014    Past Surgical History  Procedure Laterality Date  . Oophorectomy Left 03/2011  . Abdominal hysterectomy  03/2012  . Breast surgery Bilateral 1990    Reduction  . Breast biopsy Right 12/2005    Normal-Cyst    Family History  Problem Relation Age of Onset  . Hypertension Mother   . Cirrhosis Mother   . Alcohol abuse Father   . Heart disease Father   . Diabetes Brother     Social History   Social History  . Marital Status: Single    Spouse Name: N/A  . Number of Children: N/A  . Years of Education: N/A   Occupational History  . Not  on file.   Social History Main Topics  . Smoking status: Never Smoker   . Smokeless tobacco: Never Used  . Alcohol Use: 0.0 oz/week    0 Standard drinks or equivalent per week  . Drug Use: No  . Sexual Activity: No   Other Topics Concern  . Not on file   Social History Narrative     Current outpatient prescriptions:  .  aspirin 81 MG chewable tablet, Chew 1 tablet by mouth daily., Disp: , Rfl:  .  Cholecalciferol (VITAMIN D) 2000 UNITS CAPS, Take 1 capsule by mouth daily., Disp: , Rfl:  .  hydrocortisone (PROCTOCORT) 1 % CREA, Apply 1 g topically 2 (two) times daily., Disp: 1 Tube, Rfl: 0 .  lisinopril (PRINIVIL,ZESTRIL) 20 MG tablet, Take 1 tablet (20 mg  total) by mouth daily., Disp: 90 tablet, Rfl: 1 .  loratadine (CLARITIN) 10 MG tablet, Take by mouth., Disp: , Rfl:  .  metformin (FORTAMET) 500 MG (OSM) 24 hr tablet, Take 1 tablet (500 mg total) by mouth daily with breakfast., Disp: 90 tablet, Rfl: 1 .  omeprazole (PRILOSEC) 20 MG capsule, Take 1 capsule (20 mg total) by mouth daily., Disp: 30 capsule, Rfl: 2 .  rosuvastatin (CRESTOR) 20 MG tablet, Take 1 tablet (20 mg total) by mouth daily., Disp: 90 tablet, Rfl: 1  Allergies  Allergen Reactions  . Latex Rash     ROS  Constitutional: Negative for fever,  fatigue  Respiratory: Negative for cough and shortness of breath.   Cardiovascular: Negative for chest pain or palpitations.  Gastrointestinal: Negative for abdominal pain, no bowel changes.  Musculoskeletal: Negative for gait problem or joint swelling.  Skin: Negative for rash.  Neurological: Negative for dizziness or headache.  No other specific complaints in a complete review of systems (except as listed in HPI above).  Objective  Filed Vitals:   09/17/15 0810  BP: 134/80  Pulse: 91  Temp: 98.5 F (36.9 C)  TempSrc: Oral  Resp: 16  Height: 5\' 2"  (1.575 m)  Weight: 270 lb 8 oz (122.698 kg)  SpO2: 97%    Body mass index is 49.46 kg/(m^2).  Physical Exam  Constitutional: Patient appears well-developed and well-nourished. Obese  No distress.  HEENT: head atraumatic, normocephalic, pupils equal and reactive to light,  neck supple, throat within normal limits, large goiter Cardiovascular: Normal rate, regular rhythm and normal heart sounds.  No murmur heard. No BLE edema. Pulmonary/Chest: Effort normal and breath sounds normal. No respiratory distress. Abdominal: Soft.  There is no tenderness. Psychiatric: Patient has a normal mood and affect. behavior is normal. Judgment and thought content normal.  Recent Results (from the past 2160 hour(s))  POCT HgB A1C     Status: Abnormal   Collection Time: 09/17/15  8:11 AM   Result Value Ref Range   Hemoglobin A1C 6.3   POCT UA - Microalbumin     Status: Abnormal   Collection Time: 09/17/15  8:11 AM  Result Value Ref Range   Microalbumin Ur, POC 50 mg/L   Creatinine, POC  mg/dL   Albumin/Creatinine Ratio, Urine, POC      PHQ2/9: Depression screen [facility] 2/9 09/17/2015 05/17/2015 03/14/2015  Decreased Interest 0 0 0  Down, Depressed, Hopeless 0 0 0  PHQ - 2 Score 0 0 0     Fall Risk: Fall Risk  09/17/2015 05/17/2015 03/14/2015  Falls in the past year? No No No     Functional Status Survey: Is the patient deaf or have  difficulty hearing?: No Does the patient have difficulty seeing, even when wearing glasses/contacts?: No Does the patient have difficulty concentrating, remembering, or making decisions?: No Does the patient have difficulty walking or climbing stairs?: No Does the patient have difficulty dressing or bathing?: No Does the patient have difficulty doing errands alone such as visiting a doctor's office or shopping?: No   Assessment & Plan  1. 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  Dispense: 90 tablet; Refill: 1 - lisinopril (PRINIVIL,ZESTRIL) 20 MG tablet; Take 1 tablet (20 mg total) by mouth daily.  Dispense: 90 tablet; Refill: 1  2. Microalbuminuria  - lisinopril (PRINIVIL,ZESTRIL) 20 MG tablet; Take 1 tablet (20 mg total) by mouth daily.  Dispense: 90 tablet; Refill: 1  3. Morbid obesity, unspecified obesity type [facility])  Discussed with the patient the risk posed by an increased BMI. Discussed importance of portion control, calorie counting and at least 150 minutes of physical activity weekly. Avoid sweet beverages and drink more water. Eat at least 6 servings of fruit and vegetables daily   4. Apnea, sleep  Discussed importance of CPAP  use  5. Benign hypertension  - lisinopril (PRINIVIL,ZESTRIL) 20 MG tablet; Take 1 tablet (20 mg total) by mouth daily.  Dispense: 90 tablet; Refill: 1  6. Thyroid nodule  Keep follow up with surgeon   7. Dyslipidemia  - rosuvastatin (CRESTOR) 20 MG tablet; Take 1 tablet (20 mg total) by mouth daily.  Dispense: 90 tablet; Refill: 1  8. Gastro-esophageal reflux disease without esophagitis  Continue prn PPI, discussed H2 blocker  9. Need for vaccination for Strep pneumoniae  - Pneumococcal conjugate vaccine 13-valent IM  10. Breast cancer screening  She has a history of oily cysts but per patient all stable - MM Digital Screening; Future

## 2015-09-20 ENCOUNTER — Encounter: Payer: Self-pay | Admitting: Family Medicine

## 2015-09-26 LAB — HM DIABETES EYE EXAM

## 2015-10-03 DIAGNOSIS — Z8639 Personal history of other endocrine, nutritional and metabolic disease: Secondary | ICD-10-CM | POA: Insufficient documentation

## 2015-10-05 DIAGNOSIS — Z9009 Acquired absence of other part of head and neck: Secondary | ICD-10-CM | POA: Insufficient documentation

## 2015-10-05 DIAGNOSIS — E890 Postprocedural hypothyroidism: Secondary | ICD-10-CM | POA: Insufficient documentation

## 2015-10-08 HISTORY — PX: OTHER SURGICAL HISTORY: SHX169

## 2015-10-25 ENCOUNTER — Other Ambulatory Visit: Payer: Self-pay | Admitting: Family Medicine

## 2015-10-25 MED ORDER — PRAVASTATIN SODIUM 40 MG PO TABS: 40.0000 mg | ORAL_TABLET | Freq: Every day | ORAL | Status: DC

## 2015-11-01 ENCOUNTER — Telehealth: Payer: Self-pay | Admitting: Family Medicine

## 2015-11-01 MED ORDER — AMOXICILLIN-POT CLAVULANATE 875-125 MG PO TABS: 1.0000 | ORAL_TABLET | Freq: Two times a day (BID) | ORAL | Status: DC

## 2015-11-01 NOTE — Telephone Encounter (Signed)
PT IS OUT OF TOWN AND HAS A EAR INFECTION AND HAS NEW INSURANCE AND HAS NO CARD. SHE TRIED TO GO TO [facility] AND SINCE SHE DID NOT HAVE THE INFORMATION THEY WOULD NOT SEE HER. WANTS TO KNOW IF YOU WOULD CALL SOMETHING IN FOR HER AND SEND TO THE PHARM [facility] IN [place] . THINGS IT IS ON 15/501.

## 2015-11-01 NOTE — Telephone Encounter (Signed)
Approved by Dr. Steele Sizer.   Patient was advised to come over or go to Urgent Care if no improvement in 24 hours Dr Ancil Boozer spoke to her on the phone and explained that is hard to diagnose a ear infection over the phone and it could be an ear canal abscess/celulitis or mastoiditis and may need further evaluation.

## 2016-01-02 ENCOUNTER — Other Ambulatory Visit: Payer: Self-pay | Admitting: Family Medicine

## 2016-03-16 ENCOUNTER — Encounter: Payer: Self-pay | Admitting: Family Medicine

## 2016-03-16 ENCOUNTER — Ambulatory Visit (INDEPENDENT_AMBULATORY_CARE_PROVIDER_SITE_OTHER): Payer: Managed Care, Other (non HMO) | Admitting: Family Medicine

## 2016-03-16 VITALS — BP 128/76 | HR 86 | Temp 98.6°F | Resp 16 | Ht 62.0 in | Wt 271.6 lb

## 2016-03-16 DIAGNOSIS — E559 Vitamin D deficiency, unspecified: Secondary | ICD-10-CM

## 2016-03-16 DIAGNOSIS — E890 Postprocedural hypothyroidism: Secondary | ICD-10-CM

## 2016-03-16 DIAGNOSIS — I10 Essential (primary) hypertension: Secondary | ICD-10-CM | POA: Diagnosis not present

## 2016-03-16 DIAGNOSIS — G4730 Sleep apnea, unspecified: Secondary | ICD-10-CM

## 2016-03-16 DIAGNOSIS — K219 Gastro-esophageal reflux disease without esophagitis: Secondary | ICD-10-CM

## 2016-03-16 DIAGNOSIS — E785 Hyperlipidemia, unspecified: Secondary | ICD-10-CM

## 2016-03-16 DIAGNOSIS — Z9009 Acquired absence of other part of head and neck: Secondary | ICD-10-CM

## 2016-03-16 DIAGNOSIS — E1122 Type 2 diabetes mellitus with diabetic chronic kidney disease: Secondary | ICD-10-CM

## 2016-03-16 LAB — LIPID PANEL
CHOLESTEROL: 170 mg/dL (ref 125–200)
HDL: 38 mg/dL — AB (ref >=46)
LDL Cholesterol: 101 mg/dL (ref <130)
TRIGLYCERIDES: 154 mg/dL — AB (ref <150)
Total CHOL/HDL Ratio: 4.5 Ratio (ref <=5.0)
VLDL: 31 mg/dL — ABNORMAL HIGH (ref <30)

## 2016-03-16 LAB — COMPLETE METABOLIC PANEL WITH GFR
ALBUMIN: 4.5 g/dL (ref 3.6–5.1)
ALK PHOS: 59 U/L (ref 33–115)
ALT: 26 U/L (ref 6–29)
AST: 21 U/L (ref 10–35)
BUN: 10 mg/dL (ref 7–25)
CALCIUM: 10.1 mg/dL (ref 8.6–10.2)
CHLORIDE: 103 mmol/L (ref 98–110)
CO2: 30 mmol/L (ref 20–31)
CREATININE: 0.63 mg/dL (ref 0.50–1.10)
GFR, Est Non African American: >89 mL/min (ref >=60)
Glucose, Bld: 107 mg/dL — ABNORMAL HIGH (ref 65–99)
POTASSIUM: 4.4 mmol/L (ref 3.5–5.3)
SODIUM: 140 mmol/L (ref 135–146)
Total Bilirubin: 0.3 mg/dL (ref 0.2–1.2)
Total Protein: 7.2 g/dL (ref 6.1–8.1)

## 2016-03-16 LAB — POCT GLYCOSYLATED HEMOGLOBIN (HGB A1C): HEMOGLOBIN A1C: 6.5

## 2016-03-16 LAB — TSH: TSH: 2.82 m[IU]/L

## 2016-03-16 MED ORDER — RANITIDINE HCL 150 MG PO TABS: 150.0000 mg | ORAL_TABLET | Freq: Two times a day (BID) | ORAL | 2 refills | Status: DC | PRN

## 2016-03-16 MED ORDER — ROSUVASTATIN CALCIUM 20 MG PO TABS: 20.0000 mg | ORAL_TABLET | Freq: Every day | ORAL | 2 refills | Status: DC

## 2016-03-16 NOTE — Progress Notes (Signed)
Name: Debra Contreras   MRN: [id number]    DOB: January 17, 1966   Date:03/16/2016       Progress Note  Subjective  Chief Complaint  Chief Complaint  Patient presents with  . Medication Refill    6 month F/U  . Diabetes    Patient does not check sugar at home, but denies any symptoms  . Obesity    Would like to discuss start weight loss medication  . Hyperlipidemia    Patient is having nausea with cholesterol medication  . Gastroesophageal Reflux    Takes every other day and controls symptoms  . Allergic Rhinitis     Stable    HPI  Thyroid nodule: she was sitting on her desk working on a report and felt a large nodule on the left side back in October 2016, she had a partial thyroidectomy in March 2017, and is doing well since. We will recheck TSH  DMII with renal manifestation: she is doing well, compliant with medication on Metformin and Lisinopril, last urine micro was 50. She is not checking glucose at home. Due for eye exam. She denies polyphagia, polydipsia or polyuria.  Obesity:  Starting weight on April 9th, 2016 was 274.4 lbs. She had lost a lot of weight with life style modification, she went down to 253 lbs in th e Summer of 2016, she has gained most of her weight back. She had the changes from her thyroid, and was also unemployed after surgery, but is contemplating surgery now. We discussed options and the importance of changing her life style before she has surgery  HTN: taking lisinopril for bp and proteinuria and is doing well, denies chest pain or palpitation   GERD: still taking Omeprazole, she has been weaning it down to every other day, but symptoms returns if she goes over 2 days without medication, advised to alternate with Ranitidine.   OSA: not using CPAP machine because she can't afford it. Discussed importance of weight loss and risk of stroke and heart attacks.    Patient Active Problem List   Diagnosis Date Noted  . History of partial thyroidectomy  10/05/2015  . H/O thyroid nodule 10/03/2015  . Morbid obesity ([place]) 06/04/2015  . Breast lump in female 05/17/2015  . Acanthosis nigricans 03/09/2015  . Allergic rhinitis, seasonal 03/09/2015  . Diabetes mellitus with renal manifestation ([place]) 03/09/2015  . Dyslipidemia 03/09/2015  . Gastro-esophageal reflux disease without esophagitis 03/09/2015  . Hiatal hernia 03/09/2015  . History of anemia 03/09/2015  . H/O: hysterectomy 03/09/2015  . Uterus, adenomyosis 03/09/2015  . Calculus of kidney 03/09/2015  . Benign hypertension 03/09/2015  . Extreme obesity ([place]) 03/09/2015  . Vitamin D deficiency 03/09/2015  . Apnea, sleep 09/12/2014  . Microalbuminuria 05/29/2014    Past Surgical History:  Procedure Laterality Date  . ABDOMINAL HYSTERECTOMY  03/2012  . BREAST BIOPSY Right 12/2005   Normal-Cyst  . BREAST SURGERY Bilateral 1990   Reduction  . OOPHORECTOMY Left 03/2011  . REDUCTION MAMMAPLASTY    . thyroidectomy Left 10/08/2015    Family History  Problem Relation Age of Onset  . Hypertension Mother   . Cirrhosis Mother   . Alcohol abuse Father   . Heart disease Father   . Diabetes Brother     Social History   Social History  . Marital status: Single    Spouse name: N/A  . Number of children: N/A  . Years of education: N/A   Occupational History  . Not on file.  Social History Main Topics  . Smoking status: Never Smoker  . Smokeless tobacco: Never Used  . Alcohol use 0.0 oz/week  . Drug use: No  . Sexual activity: No   Other Topics Concern  . Not on file   Social History Narrative  . No narrative on file     Current Outpatient Prescriptions:  .  aspirin 81 MG chewable tablet, Chew 1 tablet by mouth daily., Disp: , Rfl:  .  Cholecalciferol (VITAMIN D) 2000 UNITS CAPS, Take 1 capsule by mouth daily., Disp: , Rfl:  .  hydrocortisone (PROCTOCORT) 1 % CREA, Apply 1 g topically 2 (two) times daily., Disp: 1 Tube, Rfl: 0 .  lisinopril (PRINIVIL,ZESTRIL) 20 MG  tablet, TAKE ONE TABLET BY MOUTH ONCE DAILY, Disp: 30 tablet, Rfl: 4 .  loratadine (CLARITIN) 10 MG tablet, Take by mouth., Disp: , Rfl:  .  metFORMIN (GLUCOPHAGE-XR) 500 MG 24 hr tablet, TAKE ONE TABLET BY MOUTH IN THE EVENING FOR DIABETES, Disp: 30 tablet, Rfl: 4 .  omeprazole (PRILOSEC) 20 MG capsule, Take 1 capsule (20 mg total) by mouth daily., Disp: 30 capsule, Rfl: 2 .  rosuvastatin (CRESTOR) 20 MG tablet, TAKE ONE TABLET BY MOUTH ONCE DAILY, Disp: 30 tablet, Rfl: 2  Allergies  Allergen Reactions  . Latex Rash     ROS  Constitutional: Negative for fever or weight change.  Respiratory: Negative for cough and shortness of breath.   Cardiovascular: Negative for chest pain or palpitations.  Gastrointestinal: Negative for abdominal pain, no bowel changes.  Musculoskeletal: Negative for gait problem or joint swelling.  Skin: Negative for rash.  Neurological: Negative for dizziness or headache.  No other specific complaints in a complete review of systems (except as listed in HPI above).   Objective  Vitals:   03/16/16 0857  BP: 128/76  Pulse: 86  Resp: 16  Temp: 98.6 F (37 C)  TempSrc: Oral  SpO2: 96%  Weight: 271 lb 9.6 oz (123.2 kg)  Height: 5\' 2"  (1.575 m)    Body mass index is 49.68 kg/m.  Physical Exam  Constitutional: Patient appears well-developed and well-nourished. Obese  No distress.  HEENT: head atraumatic, normocephalic, pupils equal and reactive to light,  neck supple, throat within normal limits Cardiovascular: Normal rate, regular rhythm and normal heart sounds.  No murmur heard. No BLE edema. Pulmonary/Chest: Effort normal and breath sounds normal. No respiratory distress. Abdominal: Soft.  There is no tenderness. Psychiatric: Patient has a normal mood and affect. behavior is normal. Judgment and thought content normal. Skin: acanthosis nigricans neck   Recent Results (from the past 2160 hour(s))  POCT HgB A1C     Status: None   Collection Time:  03/16/16  9:02 AM  Result Value Ref Range   Hemoglobin A1C 6.5     Diabetic Foot Exam: Diabetic Foot Exam - Simple   Simple Foot Form Diabetic Foot exam was performed with the following findings:  Yes 03/16/2016  9:40 AM  Visual Inspection No deformities, no ulcerations, no other skin breakdown bilaterally:  Yes Sensation Testing Intact to touch and monofilament testing bilaterally:  Yes Pulse Check Posterior Tibialis and Dorsalis pulse intact bilaterally:  Yes Comments      PHQ2/9: Depression screen [facility] 2/9 03/16/2016 09/17/2015 05/17/2015 03/14/2015  Decreased Interest 0 0 0 0  Down, Depressed, Hopeless 0 0 0 0  PHQ - 2 Score 0 0 0 0    Fall Risk: Fall Risk  03/16/2016 09/17/2015 05/17/2015 03/14/2015  Falls in the  past year? No No No No    Functional Status Survey: Is the patient deaf or have difficulty hearing?: No Does the patient have difficulty seeing, even when wearing glasses/contacts?: No Does the patient have difficulty concentrating, remembering, or making decisions?: No Does the patient have difficulty walking or climbing stairs?: No Does the patient have difficulty dressing or bathing?: No Does the patient have difficulty doing errands alone such as visiting a doctor's office or shopping?: No    Assessment & Plan  1. Type 2 diabetes mellitus with chronic kidney disease, without long-term current use of insulin, unspecified CKD stage (HCC)  Controlled  - POCT HgB A1C   2. History of partial thyroidectomy  - TSH  3. Dyslipidemia  - Lipid panel  4. Morbid obesity, unspecified obesity type [facility]) Discussed with the patient the risk posed by an increased BMI. Discussed importance of portion control, calorie counting and at least 150 minutes of physical activity weekly. Avoid sweet beverages and drink more water. Eat at least 6 servings of fruit and vegetables daily   5. Gastro-esophageal reflux disease without esophagitis  She is trying to wean off  PPI  6. Benign hypertension  - COMPLETE METABOLIC PANEL WITH GFR  7. Apnea, sleep  Explained again importance of using CPAP  8. Vitamin D deficiency  - VITAMIN D 25 Hydroxy (Vit-D Deficiency, Fractures)

## 2016-03-17 LAB — VITAMIN D 25 HYDROXY (VIT D DEFICIENCY, FRACTURES): VIT D 25 HYDROXY: 38 ng/mL (ref 30–100)

## 2016-04-12 ENCOUNTER — Other Ambulatory Visit: Payer: Self-pay | Admitting: Family Medicine

## 2016-06-17 ENCOUNTER — Ambulatory Visit (INDEPENDENT_AMBULATORY_CARE_PROVIDER_SITE_OTHER): Payer: Self-pay | Admitting: Family Medicine

## 2016-06-17 ENCOUNTER — Encounter: Payer: Self-pay | Admitting: Family Medicine

## 2016-06-17 VITALS — BP 124/82 | HR 81 | Temp 98.5°F | Resp 16 | Ht 62.0 in | Wt 272.5 lb

## 2016-06-17 DIAGNOSIS — K219 Gastro-esophageal reflux disease without esophagitis: Secondary | ICD-10-CM

## 2016-06-17 DIAGNOSIS — E785 Hyperlipidemia, unspecified: Secondary | ICD-10-CM

## 2016-06-17 DIAGNOSIS — I10 Essential (primary) hypertension: Secondary | ICD-10-CM

## 2016-06-17 DIAGNOSIS — E1129 Type 2 diabetes mellitus with other diabetic kidney complication: Secondary | ICD-10-CM

## 2016-06-17 DIAGNOSIS — R809 Proteinuria, unspecified: Secondary | ICD-10-CM

## 2016-06-17 LAB — POCT GLYCOSYLATED HEMOGLOBIN (HGB A1C): HEMOGLOBIN A1C: 6.6

## 2016-06-17 MED ORDER — PRAVASTATIN SODIUM 40 MG PO TABS: 40.0000 mg | ORAL_TABLET | Freq: Every day | ORAL | 5 refills | Status: DC

## 2016-06-17 MED ORDER — RANITIDINE HCL 150 MG PO TABS: 150.0000 mg | ORAL_TABLET | Freq: Two times a day (BID) | ORAL | 5 refills | Status: DC | PRN

## 2016-06-17 MED ORDER — METFORMIN HCL ER 500 MG PO TB24: 500.0000 mg | ORAL_TABLET | Freq: Every evening | ORAL | 5 refills | Status: DC

## 2016-06-17 MED ORDER — LISINOPRIL 20 MG PO TABS: 20.0000 mg | ORAL_TABLET | Freq: Every day | ORAL | 5 refills | Status: DC

## 2016-06-17 NOTE — Progress Notes (Signed)
Name: Debra Contreras   MRN: [id number]    DOB: 1965-08-29   Date:06/17/2016       Progress Note  Subjective  Chief Complaint  Chief Complaint  Patient presents with  . Diabetes  . Hypertension  . Hyperlipidemia    HPI  Thyroid nodule: she was sitting on her desk working on a report and felt a large nodule on the left side back in October 2016, she had a partial thyroidectomy in March 2017, and is doing well since. Last TSH was at goal   DMII with renal manifestation: she is doing well, compliant with medication on Metformin and Lisinopril, last urine micro was 50. She is not checking glucose at home. Eye exam is up to date. She denies polyphagia, polydipsia or polyuria.  Obesity:  Starting weight on April 9th, 2016 was 274.4 lbs. She had lost a lot of weight with life style modification, she went down to 253 lbs in th e Summer of 2016, she has gained most of her weight back. She had the changes from her thyroid, and was also unemployed after surgery, but is contemplating surgery now. We discussed options and the importance of changing her life style before she has surgery. Right now she is without insurance and is trying to figure out what to do.  HTN: taking lisinopril for bp and proteinuria and is doing well, denies chest pain or palpitation   GERD: she is finally off Omeprazole, she is taking Ranitidine once daily and symptoms are under control  OSA: not using CPAP machine because she can't afford it. Discussed importance of weight loss and risk of stroke and heart attacks.   Patient Active Problem List   Diagnosis Date Noted  . History of partial thyroidectomy 10/05/2015  . H/O thyroid nodule 10/03/2015  . Morbid obesity ([place]) 06/04/2015  . Breast lump in female 05/17/2015  . Acanthosis nigricans 03/09/2015  . Allergic rhinitis, seasonal 03/09/2015  . Diabetes mellitus with renal manifestation ([place]) 03/09/2015  . Dyslipidemia 03/09/2015  . Gastro-esophageal reflux  disease without esophagitis 03/09/2015  . Hiatal hernia 03/09/2015  . History of anemia 03/09/2015  . H/O: hysterectomy 03/09/2015  . Uterus, adenomyosis 03/09/2015  . Calculus of kidney 03/09/2015  . Benign hypertension 03/09/2015  . Extreme obesity ([place]) 03/09/2015  . Vitamin D deficiency 03/09/2015  . Apnea, sleep 09/12/2014  . Microalbuminuria 05/29/2014    Past Surgical History:  Procedure Laterality Date  . ABDOMINAL HYSTERECTOMY  03/2012  . BREAST BIOPSY Right 12/2005   Normal-Cyst  . BREAST SURGERY Bilateral 1990   Reduction  . OOPHORECTOMY Left 03/2011  . REDUCTION MAMMAPLASTY    . thyroidectomy Left 10/08/2015    Family History  Problem Relation Age of Onset  . Hypertension Mother   . Cirrhosis Mother   . Alcohol abuse Father   . Heart disease Father   . Diabetes Brother     Social History   Social History  . Marital status: Single    Spouse name: N/A  . Number of children: N/A  . Years of education: N/A   Occupational History  . Not on file.   Social History Main Topics  . Smoking status: Never Smoker  . Smokeless tobacco: Never Used  . Alcohol use 0.0 oz/week  . Drug use: No  . Sexual activity: No   Other Topics Concern  . Not on file   Social History Narrative  . No narrative on file     Current Outpatient Prescriptions:  .  aspirin 81 MG chewable tablet, Chew 1 tablet by mouth daily., Disp: , Rfl:  .  Cholecalciferol (VITAMIN D) 2000 UNITS CAPS, Take 1 capsule by mouth daily., Disp: , Rfl:  .  hydrocortisone (PROCTOCORT) 1 % CREA, Apply 1 g topically 2 (two) times daily., Disp: 1 Tube, Rfl: 0 .  lisinopril (PRINIVIL,ZESTRIL) 20 MG tablet, Take 1 tablet (20 mg total) by mouth daily., Disp: 30 tablet, Rfl: 5 .  loratadine (CLARITIN) 10 MG tablet, Take by mouth., Disp: , Rfl:  .  metFORMIN (GLUCOPHAGE-XR) 500 MG 24 hr tablet, Take 1 tablet (500 mg total) by mouth every evening., Disp: 30 tablet, Rfl: 5 .  pravastatin (PRAVACHOL) 40 MG tablet,  Take 1 tablet (40 mg total) by mouth daily., Disp: 30 tablet, Rfl: 5 .  ranitidine (ZANTAC) 150 MG tablet, Take 1 tablet (150 mg total) by mouth 2 (two) times daily as needed for heartburn. alternating with Omeprazole, Disp: 60 tablet, Rfl: 5  Allergies  Allergen Reactions  . Latex Rash     ROS  Constitutional: Negative for fever or weight change.  Respiratory: Negative for cough and shortness of breath.   Cardiovascular: Negative for chest pain or palpitations.  Gastrointestinal: Negative for abdominal pain, no bowel changes.  Musculoskeletal: Negative for gait problem or joint swelling.  Skin: Negative for rash.  Neurological: Negative for dizziness or headache.  No other specific complaints in a complete review of systems (except as listed in HPI above).  Objective  Vitals:   06/17/16 0825  BP: 124/82  Pulse: 81  Resp: 16  Temp: 98.5 F (36.9 C)  TempSrc: Oral  SpO2: 96%  Weight: 272 lb 8 oz (123.6 kg)  Height: 5\' 2"  (1.575 m)    Body mass index is 49.84 kg/m.  Physical Exam  Constitutional: Patient appears well-developed and well-nourished. Obese No distress.  HEENT: head atraumatic, normocephalic, pupils equal and reactive to light,  neck supple, throat within normal limits Cardiovascular: Normal rate, regular rhythm and normal heart sounds.  No murmur heard. No BLE edema. Pulmonary/Chest: Effort normal and breath sounds normal. No respiratory distress. Abdominal: Soft.  There is no tenderness. Psychiatric: Patient has a normal mood and affect. behavior is normal. Judgment and thought content normal.  PHQ2/9: Depression screen [facility] 2/9 06/17/2016 03/16/2016 09/17/2015 05/17/2015 03/14/2015  Decreased Interest 0 0 0 0 0  Down, Depressed, Hopeless 0 0 0 0 0  PHQ - 2 Score 0 0 0 0 0    Fall Risk: Fall Risk  06/17/2016 03/16/2016 09/17/2015 05/17/2015 03/14/2015  Falls in the past year? No No No No No     Functional Status Survey: Is the patient deaf or have  difficulty hearing?: No Does the patient have difficulty seeing, even when wearing glasses/contacts?: No Does the patient have difficulty concentrating, remembering, or making decisions?: No Does the patient have difficulty walking or climbing stairs?: No Does the patient have difficulty dressing or bathing?: No Does the patient have difficulty doing errands alone such as visiting a doctor's office or shopping?: No    Assessment & Plan  1. Controlled type 2 diabetes mellitus with microalbuminuria, without long-term current use of insulin (HCC)   - metFORMIN (GLUCOPHAGE-XR) 500 MG 24 hr tablet; Take 1 tablet (500 mg total) by mouth every evening.  Dispense: 30 tablet; Refill: 5 - POCT glycosylated hemoglobin (Hb A1C)  2. Benign hypertension  - lisinopril (PRINIVIL,ZESTRIL) 20 MG tablet; Take 1 tablet (20 mg total) by mouth daily.  Dispense: 30 tablet; Refill:  5  3. Morbid obesity, unspecified obesity type [facility])  Discussed with the patient the risk posed by an increased BMI. Discussed importance of portion control, calorie counting and at least 150 minutes of physical activity weekly. Avoid sweet beverages and drink more water. Eat at least 6 servings of fruit and vegetables daily   4. Dyslipidemia  We will change from Crestor to Pravastatin because of cost , since she has a gap of insurance at this time - pravastatin (PRAVACHOL) 40 MG tablet; Take 1 tablet (40 mg total) by mouth daily.  Dispense: 30 tablet; Refill: 5  5. Gastro-esophageal reflux disease without esophagitis  - ranitidine (ZANTAC) 150 MG tablet; Take 1 tablet (150 mg total) by mouth 2 (two) times daily as needed for heartburn. alternating with Omeprazole  Dispense: 60 tablet; Refill: 5  6. Microalbuminuria  Recheck next visit

## 2016-06-19 ENCOUNTER — Encounter: Payer: Self-pay | Admitting: Family Medicine

## 2016-09-03 HISTORY — PX: SLEEVE GASTROPLASTY: SHX1101

## 2016-09-16 ENCOUNTER — Ambulatory Visit: Payer: Commercial Managed Care - PPO | Admitting: Family Medicine

## 2016-09-16 DIAGNOSIS — Z903 Acquired absence of stomach [part of]: Secondary | ICD-10-CM | POA: Insufficient documentation

## 2016-09-24 ENCOUNTER — Encounter: Payer: Self-pay | Admitting: Family Medicine

## 2016-12-08 ENCOUNTER — Ambulatory Visit (INDEPENDENT_AMBULATORY_CARE_PROVIDER_SITE_OTHER): Payer: Self-pay | Admitting: Family Medicine

## 2016-12-08 ENCOUNTER — Encounter: Payer: Self-pay | Admitting: Family Medicine

## 2016-12-08 VITALS — BP 124/82 | HR 84 | Temp 98.4°F | Resp 16 | Ht 62.0 in | Wt 216.6 lb

## 2016-12-08 DIAGNOSIS — E890 Postprocedural hypothyroidism: Secondary | ICD-10-CM

## 2016-12-08 DIAGNOSIS — E559 Vitamin D deficiency, unspecified: Secondary | ICD-10-CM

## 2016-12-08 DIAGNOSIS — Z903 Acquired absence of stomach [part of]: Secondary | ICD-10-CM

## 2016-12-08 DIAGNOSIS — Z9009 Acquired absence of other part of head and neck: Secondary | ICD-10-CM

## 2016-12-08 DIAGNOSIS — K219 Gastro-esophageal reflux disease without esophagitis: Secondary | ICD-10-CM

## 2016-12-08 DIAGNOSIS — R809 Proteinuria, unspecified: Secondary | ICD-10-CM

## 2016-12-08 DIAGNOSIS — E785 Hyperlipidemia, unspecified: Secondary | ICD-10-CM

## 2016-12-08 DIAGNOSIS — E1129 Type 2 diabetes mellitus with other diabetic kidney complication: Secondary | ICD-10-CM

## 2016-12-08 DIAGNOSIS — Z862 Personal history of diseases of the blood and blood-forming organs and certain disorders involving the immune mechanism: Secondary | ICD-10-CM

## 2016-12-08 LAB — POCT UA - MICROALBUMIN: Microalbumin Ur, POC: 50 mg/L

## 2016-12-08 LAB — COMPLETE METABOLIC PANEL WITH GFR
ALT: 27 U/L (ref 6–29)
AST: 18 U/L (ref 10–35)
Albumin: 4.5 g/dL (ref 3.6–5.1)
Alkaline Phosphatase: 51 U/L (ref 33–115)
BILIRUBIN TOTAL: 0.4 mg/dL (ref 0.2–1.2)
BUN: 8 mg/dL (ref 7–25)
CHLORIDE: 103 mmol/L (ref 98–110)
CO2: 25 mmol/L (ref 20–31)
Calcium: 9.7 mg/dL (ref 8.6–10.2)
Creat: 0.66 mg/dL (ref 0.50–1.10)
GFR, Est Non African American: >89 mL/min (ref >=60)
GLUCOSE: 85 mg/dL (ref 65–99)
Potassium: 4.1 mmol/L (ref 3.5–5.3)
Sodium: 141 mmol/L (ref 135–146)
Total Protein: 7.1 g/dL (ref 6.1–8.1)

## 2016-12-08 LAB — LIPID PANEL
CHOL/HDL RATIO: 5.6 ratio — AB (ref <5.0)
CHOLESTEROL: 237 mg/dL — AB (ref <200)
HDL: 42 mg/dL — AB (ref >50)
LDL Cholesterol: 159 mg/dL — ABNORMAL HIGH (ref <100)
Triglycerides: 181 mg/dL — ABNORMAL HIGH (ref <150)
VLDL: 36 mg/dL — AB (ref <30)

## 2016-12-08 LAB — TSH: TSH: 2.18 m[IU]/L

## 2016-12-08 MED ORDER — LISINOPRIL 5 MG PO TABS: 5.0000 mg | ORAL_TABLET | Freq: Every day | ORAL | 1 refills | Status: DC

## 2016-12-08 MED ORDER — VITAMIN D 50 MCG (2000 UT) PO CAPS: 1.0000 | ORAL_CAPSULE | Freq: Every day | ORAL | Status: AC

## 2016-12-08 NOTE — Progress Notes (Signed)
Name: Debra Contreras   MRN: [id number]    DOB: April 12, 1966   Date:12/08/2016       Progress Note  Subjective  Chief Complaint  Chief Complaint  Patient presents with  . Hypertension    3 month follow up  . Diabetes  . Weight Loss    surgery in February 2018, lost 57 lbs    HPI  Thyroid nodule: she was sitting on her desk working on a report and felt a large nodule on the left side back in October 2016, she had a partial thyroidectomy in March 2017, and is doing well since. Last TSH was at goal; no skin/hair/nail changes, no fatigue, no heart palpitations.  DMII with renal manifestation: she is doing well, no longer taking Metformin and Lisinopril since 09/15/16, last urine micro was 50, today it is also 50.She is not checking glucose at home. Eye exam is up to date. She denies polyphagia, polydipsia or polyuria.  Obesity:  Starting weight on April 9th, 2016 was 274.4 lbs. Pt has Gastric Sleeve placement 09/16/16 in [place], and has lost 56lbs so far.  She has been monitoring her protein/food intake very carefully - avoids sugar and carbohydrates almost completely, and is walking more, starting to lift weights. She has a list of lab work that she needs to have done to send back to the clinic in [place].  HTN: No longer taking lisinopril since 09/15/16. Discussed need for this medication for kidney protection/proteinuria. She is doing well, denies chest pain, shortness of breath, or palpitation, no BLE edema.  GERD: Restarted Omeprazole after surgery in February, she is planning to wean off again on May 14. Had hernia repaired while in [place], and has not been having reflux symptoms in several months. She still has Ranitidine at home, and will start taking if reflux symptoms appear.  OSA: not using CPAP machine because she can't afford it. Discussed importance of weight loss and risk of stroke and heart attacks.   Dyslipidemia: Has not taken statin since 09/15/16. Has been eating  healthier.  Patient Active Problem List   Diagnosis Date Noted  . 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 History of partial thyroidectomy 10/05/2015  . H/O thyroid nodule 10/03/2015  . Morbid obesity ([place]) 06/04/2015  . Breast lump in female 05/17/2015  . Acanthosis nigricans 03/09/2015  . Allergic rhinitis, seasonal 03/09/2015  . Diabetes mellitus with renal manifestation ([place]) 03/09/2015  . Dyslipidemia 03/09/2015  . Gastro-esophageal reflux disease without esophagitis 03/09/2015  . Hiatal hernia 03/09/2015  . History of anemia 03/09/2015  . H/O: hysterectomy 03/09/2015  . Uterus, adenomyosis 03/09/2015  . Calculus of kidney 03/09/2015  . Benign hypertension 03/09/2015  . Extreme obesity 03/09/2015  . Vitamin D deficiency 03/09/2015  . Apnea, sleep 09/12/2014  . Microalbuminuria 05/29/2014    Past Surgical History:  Procedure Laterality Date  . ABDOMINAL HYSTERECTOMY  03/2012  . BREAST BIOPSY Right 12/2005   Normal-Cyst  . BREAST SURGERY Bilateral 1990   Reduction  . OOPHORECTOMY Left 03/2011  . REDUCTION MAMMAPLASTY    . thyroidectomy Left 10/08/2015    Family History  Problem Relation Age of Onset  . Hypertension Mother   . Cirrhosis Mother   . Alcohol abuse Father   . Heart disease Father   . Diabetes Brother     Social History   Social History  . Marital status: Single    Spouse name: N/A  . Number of children: N/A  . Years of education: N/A  Occupational History  . Not on file.   Social History Main Topics  . Smoking status: Never Smoker  . Smokeless tobacco: Never Used  . Alcohol use 0.0 oz/week  . Drug use: No  . Sexual activity: No   Other Topics Concern  . Not on file   Social History Narrative  . No narrative on file     Current Outpatient Prescriptions:  .  hydrocortisone (PROCTOCORT) 1 % CREA, Apply 1 g topically 2 (two) times daily., Disp: 1 Tube, Rfl: 0 .  loratadine (CLARITIN) 10 MG tablet, Take by mouth., Disp: , Rfl:  .   omeprazole (PRILOSEC) 20 MG capsule, Take 20 mg by mouth daily., Disp: , Rfl:  .  aspirin 81 MG chewable tablet, Chew 1 tablet by mouth daily., Disp: , Rfl:  .  lisinopril (PRINIVIL,ZESTRIL) 20 MG tablet, Take 1 tablet (20 mg total) by mouth daily. (Patient not taking: Reported on 12/08/2016), Disp: 30 tablet, Rfl: 5 .  metFORMIN (GLUCOPHAGE-XR) 500 MG 24 hr tablet, Take 1 tablet (500 mg total) by mouth every evening. (Patient not taking: Reported on 12/08/2016), Disp: 30 tablet, Rfl: 5 .  pravastatin (PRAVACHOL) 40 MG tablet, Take 1 tablet (40 mg total) by mouth daily. (Patient not taking: Reported on 12/08/2016), Disp: 30 tablet, Rfl: 5 .  ranitidine (ZANTAC) 150 MG tablet, Take 1 tablet (150 mg total) by mouth 2 (two) times daily as needed for heartburn. alternating with Omeprazole (Patient not taking: Reported on 12/08/2016), Disp: 60 tablet, Rfl: 5  Allergies  Allergen Reactions  . Latex Rash     ROS  Constitutional: Negative for fever or weight change.  Respiratory: Negative for cough and shortness of breath.   Cardiovascular: Negative for chest pain or palpitations.  Gastrointestinal: Negative for abdominal pain, no bowel changes.  Musculoskeletal: Negative for gait problem or joint swelling.  Skin: Negative for rash.  Neurological: Negative for dizziness or headache.  No other specific complaints in a complete review of systems (except as listed in HPI above).  Objective  Vitals:   12/08/16 1133  BP: 124/82  Pulse: 84  Resp: 16  Temp: 98.4 F (36.9 C)  TempSrc: Oral  SpO2: 96%  Weight: 216 lb 9.6 oz (98.2 kg)  Height: 5\' 2"  (1.575 m)    Body mass index is 39.62 kg/m.  Physical Exam Constitutional: Patient appears well-developed and well-nourished. Obese. No distress.  HEENT: head atraumatic, normocephalic, neck supple, throat within normal limits Cardiovascular: Normal rate, regular rhythm and normal heart sounds.  No murmur heard. No BLE edema. Pulmonary/Chest: Effort  normal and breath sounds normal. No respiratory distress. Abdominal: Soft.  There is no tenderness. Psychiatric: Patient has a normal mood and affect. behavior is normal. Judgment and thought content normal.  Recent Results (from the past 2160 hour(s))  POCT UA - Microalbumin     Status: None   Collection Time: 12/08/16 12:16 PM  Result Value Ref Range   Microalbumin Ur, POC 50 mg/L   Creatinine, POC  mg/dL   Albumin/Creatinine Ratio, Urine, POC       PHQ2/9: Depression screen [facility] 2/9 06/17/2016 03/16/2016 09/17/2015 05/17/2015 03/14/2015  Decreased Interest 0 0 0 0 0  Down, Depressed, Hopeless 0 0 0 0 0  PHQ - 2 Score 0 0 0 0 0    Fall Risk: Fall Risk  06/17/2016 03/16/2016 09/17/2015 05/17/2015 03/14/2015  Falls in the past year? No No No No No    Assessment & Plan 1. History of sleeve gastrectomy  -  Vitamin B12 - CBC  2. Type 2 diabetes mellitus with microalbuminuria, without long-term current use of insulin (HCC)  - COMPLETE METABOLIC PANEL WITH GFR - Hemoglobin A1c  3. Gastro-esophageal reflux disease without esophagitis -Wean off Prilosec and alternate with Zantac as discussed  4. Dyslipidemia  - Lipid panel  5. Microalbuminuria  - POCT UA - Microalbumin - lisinopril (PRINIVIL,ZESTRIL) 5 MG tablet; Take 1 tablet (5 mg total) by mouth daily.  Dispense: 90 tablet; Refill: 1  6. Morbid obesity ([place]) Continue lifestyle changes  7. Vitamin D deficiency - Will recheck levels at next visit - Cholecalciferol (VITAMIN D) 2000 units CAPS; Take 1 capsule (2,000 Units total) by mouth daily.  Dispense: 30 capsule  8. History of partial thyroidectomy  - TSH  9. History of anemia  - CBC   -Reviewed Health Maintenance: Needs Eye Exam, Mammogram, and annual CPE - plans to schedule once insurance from new job starts.  I saw and examined patient with Raelyn Ensign, FNP, NP-C. I am certifying that I agree with the content of this note as supervising physician.  Steele Sizer, MD [facility] 12/08/2016, 1:39 PM

## 2016-12-09 ENCOUNTER — Encounter: Payer: Self-pay | Admitting: Family Medicine

## 2016-12-09 LAB — CBC
HCT: 43.3 % (ref 35.0–45.0)
Hemoglobin: 13.7 g/dL (ref 11.7–15.5)
MCH: 28.1 pg (ref 27.0–33.0)
MCHC: 31.6 g/dL — ABNORMAL LOW (ref 32.0–36.0)
MCV: 88.9 fL (ref 80.0–100.0)
MPV: 10.6 fL (ref 7.5–12.5)
PLATELETS: 305 10*3/uL (ref 140–400)
RBC: 4.87 MIL/uL (ref 3.80–5.10)
RDW: 14.6 % (ref 11.0–15.0)
WBC: 4.2 10*3/uL (ref 3.8–10.8)

## 2016-12-09 LAB — HEMOGLOBIN A1C
Hgb A1c MFr Bld: 5.3 % (ref <5.7)
MEAN PLASMA GLUCOSE: 105 mg/dL

## 2016-12-17 ENCOUNTER — Ambulatory Visit: Payer: Self-pay | Admitting: Family Medicine

## 2017-03-09 ENCOUNTER — Other Ambulatory Visit: Payer: Self-pay | Admitting: Family Medicine

## 2017-03-09 DIAGNOSIS — Z1231 Encounter for screening mammogram for malignant neoplasm of breast: Secondary | ICD-10-CM

## 2017-03-18 ENCOUNTER — Encounter: Payer: Self-pay | Admitting: Family Medicine

## 2017-03-18 ENCOUNTER — Ambulatory Visit (INDEPENDENT_AMBULATORY_CARE_PROVIDER_SITE_OTHER): Payer: BLUE CROSS/BLUE SHIELD | Admitting: Family Medicine

## 2017-03-18 VITALS — BP 116/82 | HR 77 | Temp 98.4°F | Ht 62.0 in | Wt 205.1 lb

## 2017-03-18 DIAGNOSIS — Z1231 Encounter for screening mammogram for malignant neoplasm of breast: Secondary | ICD-10-CM

## 2017-03-18 DIAGNOSIS — Z1211 Encounter for screening for malignant neoplasm of colon: Secondary | ICD-10-CM

## 2017-03-18 DIAGNOSIS — Z1239 Encounter for other screening for malignant neoplasm of breast: Secondary | ICD-10-CM

## 2017-03-18 DIAGNOSIS — Z01419 Encounter for gynecological examination (general) (routine) without abnormal findings: Secondary | ICD-10-CM

## 2017-03-18 DIAGNOSIS — Z9884 Bariatric surgery status: Secondary | ICD-10-CM

## 2017-03-18 LAB — CBC WITH DIFFERENTIAL/PLATELET
Basophils Absolute: 40 cells/uL (ref 0–200)
Basophils Relative: 1 %
EOS ABS: 80 {cells}/uL (ref 15–500)
Eosinophils Relative: 2 %
HCT: 40.3 % (ref 35.0–45.0)
HEMOGLOBIN: 13.4 g/dL (ref 11.7–15.5)
LYMPHS PCT: 69 %
Lymphs Abs: 2760 cells/uL (ref 850–3900)
MCH: 29.6 pg (ref 27.0–33.0)
MCHC: 33.3 g/dL (ref 32.0–36.0)
MCV: 89.2 fL (ref 80.0–100.0)
MONO ABS: 280 {cells}/uL (ref 200–950)
MPV: 10.3 fL (ref 7.5–12.5)
Monocytes Relative: 7 %
Neutro Abs: 840 cells/uL — ABNORMAL LOW (ref 1500–7800)
Neutrophils Relative %: 21 %
Platelets: 236 10*3/uL (ref 140–400)
RBC: 4.52 MIL/uL (ref 3.80–5.10)
RDW: 14.1 % (ref 11.0–15.0)
WBC: 4 10*3/uL (ref 3.8–10.8)

## 2017-03-18 LAB — TSH: TSH: 1.73 m[IU]/L

## 2017-03-18 NOTE — Patient Instructions (Signed)
Preventive Care 40-64 Years, Female Preventive care refers to lifestyle choices and visits with your health care provider that can promote health and wellness. What does preventive care include?  A yearly physical exam. This is also called an annual well check.  Dental exams once or twice a year.  Routine eye exams. Ask your health care provider how often you should have your eyes checked.  Personal lifestyle choices, including: ? Daily care of your teeth and gums. ? Regular physical activity. ? Eating a healthy diet. ? Avoiding tobacco and drug use. ? Limiting alcohol use. ? Practicing safe sex. ? Taking low-dose aspirin daily starting at age 35. ? Taking vitamin and mineral supplements as recommended by your health care provider. What happens during an annual well check? The services and screenings done by your health care provider during your annual well check will depend on your age, overall health, lifestyle risk factors, and family history of disease. Counseling Your health care provider may ask you questions about your:  Alcohol use.  Tobacco use.  Drug use.  Emotional well-being.  Home and relationship well-being.  Sexual activity.  Eating habits.  Work and work Statistician.  Method of birth control.  Menstrual cycle.  Pregnancy history.  Screening You may have the following tests or measurements:  Height, weight, and BMI.  Blood pressure.  Lipid and cholesterol levels. These may be checked every 5 years, or more frequently if you are over 50 years old.  Skin check.  Lung cancer screening. You may have this screening every year starting at age 50 if you have a 30-pack-year history of smoking and currently smoke or have quit within the past 15 years.  Fecal occult blood test (FOBT) of the stool. You may have this test every year starting at age 50.  Flexible sigmoidoscopy or colonoscopy. You may have a sigmoidoscopy every 5 years or a colonoscopy  every 10 years starting at age 50.  Hepatitis C blood test.  Hepatitis B blood test.  Sexually transmitted disease (STD) testing.  Diabetes screening. This is done by checking your blood sugar (glucose) after you have not eaten for a while (fasting). You may have this done every 1-3 years.  Mammogram. This may be done every 1-2 years. Talk to your health care provider about when you should start having regular mammograms. This may depend on whether you have a family history of breast cancer.  BRCA-related cancer screening. This may be done if you have a family history of breast, ovarian, tubal, or peritoneal cancers.  Pelvic exam and Pap test. This may be done every 3 years starting at age 50. Starting at age 50, this may be done every 5 years if you have a Pap test in combination with an HPV test.  Bone density scan. This is done to screen for osteoporosis. You may have this scan if you are at high risk for osteoporosis.  Discuss your test results, treatment options, and if necessary, the need for more tests with your health care provider. Vaccines Your health care provider may recommend certain vaccines, such as:  Influenza vaccine. This is recommended every year.  Tetanus, diphtheria, and acellular pertussis (Tdap, Td) vaccine. You may need a Td booster every 10 years.  Varicella vaccine. You may need this if you have not been vaccinated.  Zoster vaccine. You may need this after age 50.  Measles, mumps, and rubella (MMR) vaccine. You may need at least one dose of MMR if you were born in  1950 or later. You may also need a second dose.  Pneumococcal 13-valent conjugate (PCV13) vaccine. You may need this if you have certain conditions and were not previously vaccinated.  Pneumococcal polysaccharide (PPSV23) vaccine. You may need one or two doses if you smoke cigarettes or if you have certain conditions.  Meningococcal vaccine. You may need this if you have certain  conditions.  Hepatitis A vaccine. You may need this if you have certain conditions or if you travel or work in places where you may be exposed to hepatitis A.  Hepatitis B vaccine. You may need this if you have certain conditions or if you travel or work in places where you may be exposed to hepatitis B.  Haemophilus influenzae type b (Hib) vaccine. You may need this if you have certain conditions.  Talk to your health care provider about which screenings and vaccines you need and how often you need them. This information is not intended to replace advice given to you by your health care provider. Make sure you discuss any questions you have with your health care provider. Document Released: 08/16/2015 Document Revised: 04/08/2016 Document Reviewed: 05/21/2015 Elsevier Interactive Patient Education  2017 [employer].

## 2017-03-18 NOTE — Progress Notes (Signed)
Name: Debra Contreras   MRN: [id number]    DOB: 09/21/1965   Date:03/18/2017       Progress Note  Subjective  Chief Complaint  Chief Complaint  Patient presents with  . Annual Exam    HPI  Well Woman: she had a hysterectomy for adenomyosis, mammogram scheduled, due for colonoscopy. Had bariatric surgery Feb 14th, 2018 and is due for 6 months labs. Feeling well, lost 70 lbs since surgery. She is eating small portion, high in protein. Off diabetes and cholesterol medications and we will recheck labs. Taking ace for kidney protection. Not on aspirin, but advised her to check with surgeon if she can take 81 mg for cardiovascular protection.    Patient Active Problem List   Diagnosis Date Noted  . 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 History of partial thyroidectomy 10/05/2015  . H/O thyroid nodule 10/03/2015  . Morbid obesity ([place]) 06/04/2015  . Breast lump in female 05/17/2015  . Acanthosis nigricans 03/09/2015  . Allergic rhinitis, seasonal 03/09/2015  . Diabetes mellitus with renal manifestation ([place]) 03/09/2015  . Dyslipidemia 03/09/2015  . Gastro-esophageal reflux disease without esophagitis 03/09/2015  . Hiatal hernia 03/09/2015  . History of anemia 03/09/2015  . H/O: hysterectomy 03/09/2015  . Uterus, adenomyosis 03/09/2015  . Calculus of kidney 03/09/2015  . Benign hypertension 03/09/2015  . Extreme obesity 03/09/2015  . Vitamin D deficiency 03/09/2015  . Apnea, sleep 09/12/2014  . Microalbuminuria 05/29/2014    Past Surgical History:  Procedure Laterality Date  . ABDOMINAL HYSTERECTOMY  03/2012  . BREAST BIOPSY Right 12/2005   Normal-Cyst  . BREAST SURGERY Bilateral 1990   Reduction  . OOPHORECTOMY Left 03/2011  . REDUCTION MAMMAPLASTY    . SLEEVE GASTROPLASTY  09/2016   [place]  . thyroidectomy Left 10/08/2015    Family History  Problem Relation Age of Onset  . Hypertension Mother   . Cirrhosis Mother   . COPD Mother        Heavy Smoker  .  Hepatitis C Mother   . Alcohol abuse Father   . Heart disease Father   . Heart attack Father   . Diabetes Brother   . Obesity Brother   . Lung cancer Maternal Grandmother        Snuff  . Diabetes Maternal Grandmother   . Alcoholism Maternal Grandmother   . Prostate cancer Paternal Grandfather   . Alcoholism Maternal Grandfather   . Diabetes Maternal Grandfather     Social History   Social History  . Marital status: Single    Spouse name: N/A  . Number of children: N/A  . Years of education: N/A   Occupational History  . Not on file.   Social History Main Topics  . Smoking status: Never Smoker  . Smokeless tobacco: Never Used  . Alcohol use 0.0 oz/week     Comment: wine-social drinker  . Drug use: No  . Sexual activity: Yes    Partners: Male   Other Topics Concern  . 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    She is a Designer, jewellery.      Current Outpatient Prescriptions:  .  Cholecalciferol (VITAMIN D) 2000 units CAPS, Take 1 capsule (2,000 Units total) by mouth daily., Disp: 30 capsule, Rfl:  .  hydrocortisone (PROCTOCORT) 1 % CREA, Apply 1 g topically 2 (two) times daily., Disp: 1 Tube, Rfl: 0 .  lisinopril (PRINIVIL,ZESTRIL) 5 MG tablet,  Take 1 tablet (5 mg total) by mouth daily., Disp: 90 tablet, Rfl: 1 .  loratadine (CLARITIN) 10 MG tablet, Take by mouth., Disp: , Rfl:  .  ranitidine (ZANTAC) 150 MG tablet, Take 1 tablet (150 mg total) by mouth 2 (two) times daily as needed for heartburn. alternating with Omeprazole (Patient taking differently: Take 150 mg by mouth daily. alternating with Omeprazole), Disp: 60 tablet, Rfl: 5  Allergies  Allergen Reactions  . Latex Rash     ROS  Constitutional: Negative for fever, positive for  weight change.  Respiratory: Negative for cough and shortness of breath.   Cardiovascular: Negative for chest pain or palpitations.  Gastrointestinal: Negative for  abdominal pain, no bowel changes.  Musculoskeletal: Negative for gait problem or joint swelling.  Skin: Negative for rash.  Neurological: Negative for dizziness or headache.  No other specific complaints in a complete review of systems (except as listed in HPI above).  Objective  Vitals:   03/18/17 0816  BP: 116/82  Pulse: 77  Temp: 98.4 F (36.9 C)  TempSrc: Oral  Weight: 205 lb 1.6 oz (93 kg)  Height: 5\' 2"  (1.575 m)    Body mass index is 37.51 kg/m.  Physical Exam  Constitutional: Patient appears well-developed and obese . No distress.  HENT: Head: Normocephalic and atraumatic. Ears: B TMs ok, no erythema or effusion; Nose: Nose normal. Mouth/Throat: Oropharynx is clear and moist. No oropharyngeal exudate.  Eyes: Conjunctivae and EOM are normal. Pupils are equal, round, and reactive to light. No scleral icterus.  Neck: Normal range of motion. Neck supple. No JVD present. No thyromegaly present.  Cardiovascular: Normal rate, regular rhythm and normal heart sounds.  No murmur heard. No BLE edema. Pulmonary/Chest: Effort normal and breath sounds normal. No respiratory distress. Abdominal: Soft. Bowel sounds are normal, no distension. There is no tenderness. no masses Breast: no lumps or masses, no nipple discharge or rashes, scar from previous breast reduction  FEMALE GENITALIA:  External genitalia normal External urethra normal Pelvic not done RECTAL: not done Musculoskeletal: Normal range of motion, no joint effusions. No gross deformities Neurological: he is alert and oriented to person, place, and time. No cranial nerve deficit. Coordination, balance, strength, speech and gait are normal.  Skin: Skin is warm and dry. No rash noted. No erythema.  Psychiatric: Patient has a normal mood and affect. behavior is normal. Judgment and thought content normal.  PHQ2/9: Depression screen [facility] 2/9 03/18/2017 06/17/2016 03/16/2016 09/17/2015 05/17/2015  Decreased Interest 0 0 0 0 0   Down, Depressed, Hopeless 0 0 0 0 0  PHQ - 2 Score 0 0 0 0 0     Fall Risk: Fall Risk  03/18/2017 06/17/2016 03/16/2016 09/17/2015 05/17/2015  Falls in the past year? No No No No No     Functional Status Survey: Is the patient deaf or have difficulty hearing?: No Does the patient have difficulty seeing, even when wearing glasses/contacts?: No Does the patient have difficulty concentrating, remembering, or making decisions?: No Does the patient have difficulty walking or climbing stairs?: No Does the patient have difficulty dressing or bathing?: No Does the patient have difficulty doing errands alone such as visiting a doctor's office or shopping?: No   Assessment & Plan  1. Well woman exam  Discussed importance of 150 minutes of physical activity weekly, eat two servings of fish weekly, eat one serving of tree nuts ( cashews, pistachios, pecans, almonds.Marland Kitchen) every other day, eat 6 servings of fruit/vegetables daily and drink plenty  of water and avoid sweet beverages.  - Parathyroid hormone, intact (no Ca) - COMPLETE METABOLIC PANEL WITH GFR - CBC with Differential/Platelet - Lipid panel - Hemoglobin A1c - VITAMIN D 25 Hydroxy (Vit-D Deficiency, Fractures) - Vitamin B12 - Vitamin B1 - TSH - T4  2. Colon cancer screening  - Ambulatory referral to Gastroenterology  3. Breast cancer screening  Already has appointment scheduled  4. History of bariatric surgery  - Parathyroid hormone, intact (no Ca) - COMPLETE METABOLIC PANEL WITH GFR - CBC with Differential/Platelet - Lipid panel - Hemoglobin A1c - VITAMIN D 25 Hydroxy (Vit-D Deficiency, Fractures) - Vitamin B12 - Vitamin B1 - TSH - T4

## 2017-03-19 LAB — COMPLETE METABOLIC PANEL WITH GFR
ALT: 20 U/L (ref 6–29)
AST: 16 U/L (ref 10–35)
Albumin: 4.6 g/dL (ref 3.6–5.1)
Alkaline Phosphatase: 60 U/L (ref 33–130)
BUN: 8 mg/dL (ref 7–25)
CHLORIDE: 103 mmol/L (ref 98–110)
CO2: 23 mmol/L (ref 20–32)
CREATININE: 0.67 mg/dL (ref 0.50–1.05)
Calcium: 9.8 mg/dL (ref 8.6–10.4)
GFR, Est Non African American: >89 mL/min (ref >=60)
Glucose, Bld: 86 mg/dL (ref 65–99)
Potassium: 4.2 mmol/L (ref 3.5–5.3)
Sodium: 140 mmol/L (ref 135–146)
Total Bilirubin: 0.4 mg/dL (ref 0.2–1.2)
Total Protein: 7 g/dL (ref 6.1–8.1)

## 2017-03-19 LAB — LIPID PANEL
CHOL/HDL RATIO: 4.8 ratio (ref <5.0)
CHOLESTEROL: 225 mg/dL — AB (ref <200)
HDL: 47 mg/dL — ABNORMAL LOW (ref >50)
LDL Cholesterol: 152 mg/dL — ABNORMAL HIGH (ref <100)
TRIGLYCERIDES: 129 mg/dL (ref <150)
VLDL: 26 mg/dL (ref <30)

## 2017-03-19 LAB — T4: T4 TOTAL: 7.8 ug/dL (ref 5.1–11.9)

## 2017-03-19 LAB — VITAMIN B12: Vitamin B-12: >2000 pg/mL — ABNORMAL HIGH (ref 200–1100)

## 2017-03-19 LAB — HEMOGLOBIN A1C
Hgb A1c MFr Bld: 5.2 % (ref <5.7)
Mean Plasma Glucose: 103 mg/dL

## 2017-03-19 LAB — VITAMIN D 25 HYDROXY (VIT D DEFICIENCY, FRACTURES): VIT D 25 HYDROXY: 51 ng/mL (ref 30–100)

## 2017-03-19 LAB — PARATHYROID HORMONE, INTACT (NO CA): PTH: 24 pg/mL (ref 14–64)

## 2017-03-22 LAB — VITAMIN B1: Vitamin B1 (Thiamine): 6 nmol/L — ABNORMAL LOW (ref 8–30)

## 2017-03-25 ENCOUNTER — Other Ambulatory Visit: Payer: Self-pay

## 2017-03-25 ENCOUNTER — Telehealth: Payer: Self-pay

## 2017-03-25 DIAGNOSIS — Z1211 Encounter for screening for malignant neoplasm of colon: Secondary | ICD-10-CM

## 2017-03-25 DIAGNOSIS — Z1212 Encounter for screening for malignant neoplasm of rectum: Principal | ICD-10-CM

## 2017-03-25 NOTE — Telephone Encounter (Signed)
Gastroenterology Pre-Procedure Review  Request Date: 10/5 Requesting Physician: Dr. Vicente Males  *Patient had vertical sleeve gastrectomy in Feb 2018  PATIENT REVIEW QUESTIONS:   1. Are you having any GI issues? no 2. Do you have a personal history of Polyps? no 3. Do you have a family history of Colon Cancer or Polyps? yes (mother: polyps) 4. Diabetes Mellitus? no 5. Joint replacements in the past 12 months?no 6. Major health problems in the past 3 months?no 7. Any artificial heart valves, MVP, or defibrillator?no    MEDICATIONS & ALLERGIES:    Patient reports the following regarding taking any anticoagulation/antiplatelet therapy:   Plavix, Coumadin, Eliquis, Xarelto, Lovenox, Pradaxa, Brilinta, or Effient? no Aspirin? no  Patient confirms/reports the following medications:  Current Outpatient Prescriptions  Medication Sig Dispense Refill  . Cholecalciferol (VITAMIN D) 2000 units CAPS Take 1 capsule (2,000 Units total) by mouth daily. 30 capsule   . hydrocortisone (PROCTOCORT) 1 % CREA Apply 1 g topically 2 (two) times daily. 1 Tube 0  . lisinopril (PRINIVIL,ZESTRIL) 5 MG tablet Take 1 tablet (5 mg total) by mouth daily. 90 tablet 1  . loratadine (CLARITIN) 10 MG tablet Take by mouth.    . ranitidine (ZANTAC) 150 MG tablet Take 1 tablet (150 mg total) by mouth 2 (two) times daily as needed for heartburn. alternating with Omeprazole (Patient taking differently: Take 150 mg by mouth daily. alternating with Omeprazole) 60 tablet 5   No current facility-administered medications for this visit.     Patient confirms/reports the following allergies:  Allergies  Allergen Reactions  . Latex Rash    No orders of the defined types were placed in this encounter.   AUTHORIZATION INFORMATION Primary Insurance: 1D#: Group #:  Secondary Insurance: 1D#: Group #:  SCHEDULE INFORMATION: Date: 10/5 Time: Location: [place]

## 2017-03-30 ENCOUNTER — Ambulatory Visit
Admission: RE | Admit: 2017-03-30 | Discharge: 2017-03-30 | Disposition: A | Discharge status: Home / Self Care | Payer: BLUE CROSS/BLUE SHIELD | Source: Ambulatory Visit | Attending: Family Medicine | Admitting: Family Medicine

## 2017-03-30 DIAGNOSIS — Z1231 Encounter for screening mammogram for malignant neoplasm of breast: Secondary | ICD-10-CM | POA: Insufficient documentation

## 2017-04-30 ENCOUNTER — Encounter: Payer: Self-pay | Admitting: *Deleted

## 2017-04-30 ENCOUNTER — Telehealth: Payer: Self-pay | Admitting: Gastroenterology

## 2017-04-30 NOTE — Telephone Encounter (Signed)
Patient LVM to call her regarding medications before her procedure on 05/07/17.

## 2017-04-30 NOTE — Telephone Encounter (Signed)
error 

## 2017-05-03 NOTE — Telephone Encounter (Signed)
Clarification given concerning vitamins.

## 2017-05-07 ENCOUNTER — Encounter: Payer: Self-pay | Admitting: *Deleted

## 2017-05-07 ENCOUNTER — Encounter
Admission: RE | Disposition: A | Discharge status: Home / Self Care | Payer: Self-pay | Source: Ambulatory Visit | Attending: Gastroenterology

## 2017-05-07 ENCOUNTER — Ambulatory Visit: Payer: BLUE CROSS/BLUE SHIELD | Admitting: Anesthesiology

## 2017-05-07 ENCOUNTER — Ambulatory Visit
Admission: RE | Admit: 2017-05-07 | Discharge: 2017-05-07 | Disposition: A | Discharge status: Home / Self Care | Payer: BLUE CROSS/BLUE SHIELD | Source: Ambulatory Visit | Attending: Gastroenterology | Admitting: Gastroenterology

## 2017-05-07 DIAGNOSIS — D125 Benign neoplasm of sigmoid colon: Secondary | ICD-10-CM | POA: Diagnosis not present

## 2017-05-07 DIAGNOSIS — Z825 Family history of asthma and other chronic lower respiratory diseases: Secondary | ICD-10-CM | POA: Insufficient documentation

## 2017-05-07 DIAGNOSIS — Z8249 Family history of ischemic heart disease and other diseases of the circulatory system: Secondary | ICD-10-CM | POA: Diagnosis not present

## 2017-05-07 DIAGNOSIS — E559 Vitamin D deficiency, unspecified: Secondary | ICD-10-CM | POA: Insufficient documentation

## 2017-05-07 DIAGNOSIS — I10 Essential (primary) hypertension: Secondary | ICD-10-CM | POA: Insufficient documentation

## 2017-05-07 DIAGNOSIS — Z1211 Encounter for screening for malignant neoplasm of colon: Secondary | ICD-10-CM | POA: Diagnosis not present

## 2017-05-07 DIAGNOSIS — Z9884 Bariatric surgery status: Secondary | ICD-10-CM | POA: Insufficient documentation

## 2017-05-07 DIAGNOSIS — Z8379 Family history of other diseases of the digestive system: Secondary | ICD-10-CM | POA: Insufficient documentation

## 2017-05-07 DIAGNOSIS — E785 Hyperlipidemia, unspecified: Secondary | ICD-10-CM | POA: Insufficient documentation

## 2017-05-07 DIAGNOSIS — Z800 Family history of malignant neoplasm of digestive organs: Secondary | ICD-10-CM | POA: Insufficient documentation

## 2017-05-07 DIAGNOSIS — Z87442 Personal history of urinary calculi: Secondary | ICD-10-CM | POA: Insufficient documentation

## 2017-05-07 DIAGNOSIS — Z79899 Other long term (current) drug therapy: Secondary | ICD-10-CM | POA: Insufficient documentation

## 2017-05-07 DIAGNOSIS — E282 Polycystic ovarian syndrome: Secondary | ICD-10-CM | POA: Insufficient documentation

## 2017-05-07 DIAGNOSIS — R0683 Snoring: Secondary | ICD-10-CM | POA: Insufficient documentation

## 2017-05-07 DIAGNOSIS — Z90710 Acquired absence of both cervix and uterus: Secondary | ICD-10-CM | POA: Insufficient documentation

## 2017-05-07 DIAGNOSIS — E119 Type 2 diabetes mellitus without complications: Secondary | ICD-10-CM | POA: Insufficient documentation

## 2017-05-07 DIAGNOSIS — Z8371 Family history of colonic polyps: Secondary | ICD-10-CM

## 2017-05-07 DIAGNOSIS — K219 Gastro-esophageal reflux disease without esophagitis: Secondary | ICD-10-CM | POA: Diagnosis not present

## 2017-05-07 DIAGNOSIS — G4730 Sleep apnea, unspecified: Secondary | ICD-10-CM | POA: Diagnosis not present

## 2017-05-07 DIAGNOSIS — Z811 Family history of alcohol abuse and dependence: Secondary | ICD-10-CM | POA: Insufficient documentation

## 2017-05-07 DIAGNOSIS — K449 Diaphragmatic hernia without obstruction or gangrene: Secondary | ICD-10-CM | POA: Insufficient documentation

## 2017-05-07 DIAGNOSIS — Z8489 Family history of other specified conditions: Secondary | ICD-10-CM | POA: Insufficient documentation

## 2017-05-07 DIAGNOSIS — D122 Benign neoplasm of ascending colon: Secondary | ICD-10-CM | POA: Diagnosis not present

## 2017-05-07 DIAGNOSIS — Z91040 Latex allergy status: Secondary | ICD-10-CM | POA: Diagnosis not present

## 2017-05-07 DIAGNOSIS — E890 Postprocedural hypothyroidism: Secondary | ICD-10-CM | POA: Insufficient documentation

## 2017-05-07 DIAGNOSIS — Z833 Family history of diabetes mellitus: Secondary | ICD-10-CM | POA: Diagnosis not present

## 2017-05-07 DIAGNOSIS — Z1212 Encounter for screening for malignant neoplasm of rectum: Secondary | ICD-10-CM

## 2017-05-07 DIAGNOSIS — Z8042 Family history of malignant neoplasm of prostate: Secondary | ICD-10-CM | POA: Insufficient documentation

## 2017-05-07 DIAGNOSIS — Z801 Family history of malignant neoplasm of trachea, bronchus and lung: Secondary | ICD-10-CM | POA: Insufficient documentation

## 2017-05-07 HISTORY — PX: COLONOSCOPY WITH PROPOFOL: SHX5780

## 2017-05-07 SURGERY — COLONOSCOPY WITH PROPOFOL
Anesthesia: General

## 2017-05-07 MED ORDER — PROPOFOL 500 MG/50ML IV EMUL
INTRAVENOUS | Status: AC
  Filled 2017-05-07: qty 50

## 2017-05-07 MED ORDER — PROPOFOL 10 MG/ML IV BOLUS
INTRAVENOUS | Status: DC | PRN
  Administered 2017-05-07: 30 mg via INTRAVENOUS
  Administered 2017-05-07: 70 mg via INTRAVENOUS

## 2017-05-07 MED ORDER — MIDAZOLAM HCL 2 MG/2ML IJ SOLN
INTRAMUSCULAR | Status: AC
  Filled 2017-05-07: qty 2

## 2017-05-07 MED ORDER — MIDAZOLAM HCL 5 MG/5ML IJ SOLN
INTRAMUSCULAR | Status: DC | PRN
  Administered 2017-05-07: 2 mg via INTRAVENOUS

## 2017-05-07 MED ORDER — LIDOCAINE 2% (20 MG/ML) 5 ML SYRINGE
INTRAMUSCULAR | Status: DC | PRN
  Administered 2017-05-07: 25 mg via INTRAVENOUS

## 2017-05-07 MED ORDER — PROPOFOL 500 MG/50ML IV EMUL
INTRAVENOUS | Status: DC | PRN
Start: 2017-05-07 — End: 2017-05-07
  Administered 2017-05-07: 120 ug/kg/min via INTRAVENOUS

## 2017-05-07 MED ORDER — SODIUM CHLORIDE 0.9 % IV SOLN
INTRAVENOUS | Status: DC
  Administered 2017-05-07: 08:00:00 via INTRAVENOUS

## 2017-05-07 NOTE — Anesthesia Post-op Follow-up Note (Signed)
Anesthesia QCDR form completed.        

## 2017-05-07 NOTE — H&P (Signed)
Jonathon Bellows MD [address] [place], [place] [address] Phone: [phone] Fax : [phone]  Primary Care Physician:  Steele Sizer, MD Primary Gastroenterologist:  Dr. Jonathon Bellows   Pre-Procedure History & Physical: HPI:  Debra Contreras is a 51 y.o. female is here for an colonoscopy.   Past Medical History:  Diagnosis Date  . Allergy   . Diabetes mellitus without complication ([place])   . GERD (gastroesophageal reflux disease)   . Hiatal hernia   . History of PCOS   . Hyperlipidemia   . Hypertension   . Kidney stones   . Microalbuminuria    DM  . Sleep apnea   . Snoring   . Vitamin D deficiency     Past Surgical History:  Procedure Laterality Date  . ABDOMINAL HYSTERECTOMY  03/2012  . BREAST BIOPSY Right 12/2005   Normal-Cyst  . BREAST SURGERY Bilateral 1990   Reduction  . OOPHORECTOMY Left 03/2011  . REDUCTION MAMMAPLASTY    . SLEEVE GASTROPLASTY  09/2016   [place]  . thyroidectomy Left 10/08/2015    Prior to Admission medications   Medication Sig Start Date End Date Taking? Authorizing Provider  Cholecalciferol (VITAMIN D) 2000 units CAPS Take 1 capsule (2,000 Units total) by mouth daily. 12/08/16  Yes Hubbard Hartshorn, FNP  lisinopril (PRINIVIL,ZESTRIL) 5 MG tablet Take 1 tablet (5 mg total) by mouth daily. 12/08/16  Yes Hubbard Hartshorn, FNP  hydrocortisone (PROCTOCORT) 1 % CREA Apply 1 g topically 2 (two) times daily. 06/04/15   Steele Sizer, MD  loratadine (CLARITIN) 10 MG tablet Take by mouth.    [provider]  ranitidine (ZANTAC) 150 MG tablet Take 1 tablet (150 mg total) by mouth 2 (two) times daily as needed for heartburn. alternating with Omeprazole Patient taking differently: Take 150 mg by mouth daily. alternating with Omeprazole 06/17/16   Steele Sizer, MD    Allergies as of 03/25/2017 - Review Complete 03/18/2017  Allergen Reaction Noted  . Latex Rash 02/26/2015    Family History  Problem Relation Age of Onset  .  Hypertension Mother   . Cirrhosis Mother   . COPD Mother        Heavy Smoker  . Hepatitis C Mother   . Alcohol abuse Father   . Heart disease Father   . Heart attack Father   . Diabetes Brother   . Obesity Brother   . Lung cancer Maternal Grandmother        Snuff  . Diabetes Maternal Grandmother   . Alcoholism Maternal Grandmother   . Prostate cancer Paternal Grandfather   . Alcoholism Maternal Grandfather   . Diabetes Maternal Grandfather     Social History   Social History  . Marital status: Single    Spouse name: N/A  . Number of children: N/A  . Years of education: N/A   Occupational History  . Not on file.   Social History Main Topics  . Smoking status: Never Smoker  . Smokeless tobacco: Never Used  . Alcohol use 0.0 oz/week     Comment: wine-social drinker  . Drug use: No  . Sexual activity: Yes    Partners: Male   Other Topics Concern  . 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     Review of Systems: See HPI, otherwise negative ROS  Physical Exam: BP 112/67   Pulse 74  Temp (!) 97.4 F (36.3 C) (Tympanic)   Resp 14   Ht 5\' 2"  (1.575 m)   Wt 193 lb (87.5 kg)   SpO2 100%   BMI 35.30 kg/m  General:   Alert,  pleasant and cooperative in NAD Head:  Normocephalic and atraumatic. Neck:  Supple; no masses or thyromegaly. Lungs:  Clear throughout to auscultation.    Heart:  Regular rate and rhythm. Abdomen:  Soft, nontender and nondistended. Normal bowel sounds, without guarding, and without rebound.   Neurologic:  Alert and  oriented x4;  grossly normal neurologically.  Impression/Plan: Debra Contreras is here for an colonoscopy to be performed for colorectal cancer screening with family history of colon polyps   Risks, benefits, limitations, and alternatives regarding  colonoscopy have been reviewed with the patient.  Questions have been answered.  All parties  agreeable.   Jonathon Bellows, MD  05/07/2017, 8:09 AM

## 2017-05-07 NOTE — Transfer of Care (Signed)
Immediate Anesthesia Transfer of Care Note  Patient: [facility]  Procedure(s) Performed: COLONOSCOPY WITH PROPOFOL (N/A )  Patient Location: PACU and Endoscopy Unit  Anesthesia Type:General  Level of Consciousness: awake  Airway & Oxygen Therapy: Patient Spontanous Breathing and Patient connected to nasal cannula oxygen  Post-op Assessment: Report given to RN and Post -op Vital signs reviewed and stable  Post vital signs: Reviewed  Last Vitals:  Vitals:   05/07/17 0746 05/07/17 0923  BP: 112/67 118/81  Pulse: 74 71  Resp: 14 15  Temp: (!) 36.3 C (!) 36.2 C  SpO2: 100% 100%    Last Pain:  Vitals:   05/07/17 0746  TempSrc: Tympanic         Complications: No apparent anesthesia complications

## 2017-05-07 NOTE — Anesthesia Postprocedure Evaluation (Signed)
Anesthesia Post Note  Patient: Debra Contreras  Procedure(s) Performed: COLONOSCOPY WITH PROPOFOL (N/A )  Patient location during evaluation: Endoscopy Anesthesia Type: General Level of consciousness: awake and alert Pain management: pain level controlled Vital Signs Assessment: post-procedure vital signs reviewed and stable Respiratory status: spontaneous breathing and respiratory function stable Cardiovascular status: stable Anesthetic complications: no     Last Vitals:  Vitals:   05/07/17 0746 05/07/17 0923  BP: 112/67 118/81  Pulse: 74 71  Resp: 14 19  Temp: (!) 36.3 C (!) 36.2 C  SpO2: 100% 100%    Last Pain:  Vitals:   05/07/17 0923  TempSrc: Tympanic                 KEPHART,WILLIAM K

## 2017-05-07 NOTE — Anesthesia Preprocedure Evaluation (Signed)
Anesthesia Evaluation  Patient identified by MRN, date of birth, ID band Patient awake    Reviewed: Allergy & Precautions, NPO status , Patient's Chart, lab work & pertinent test results  History of Anesthesia Complications Negative for: history of anesthetic complications  Airway Mallampati: III       Dental   Pulmonary sleep apnea ,           Cardiovascular (-) hypertension(-) Past MI and (-) CHF (-) dysrhythmias (-) Valvular Problems/Murmurs     Neuro/Psych    GI/Hepatic Neg liver ROS, hiatal hernia, GERD  Medicated,  Endo/Other  diabetes (borderline)  Renal/GU Renal InsufficiencyRenal disease (stones)     Musculoskeletal   Abdominal   Peds  Hematology   Anesthesia Other Findings   Reproductive/Obstetrics                             Anesthesia Physical Anesthesia Plan  ASA: III  Anesthesia Plan: General   Post-op Pain Management:    Induction: Intravenous  PONV Risk Score and Plan:   Airway Management Planned: Nasal Cannula  Additional Equipment:   Intra-op Plan:   Post-operative Plan:   Informed Consent: I have reviewed the patients History and Physical, chart, labs and discussed the procedure including the risks, benefits and alternatives for the proposed anesthesia with the patient or authorized representative who has indicated his/her understanding and acceptance.     Plan Discussed with:   Anesthesia Plan Comments:         Anesthesia Quick Evaluation

## 2017-05-07 NOTE — Op Note (Addendum)
[facility] Gastroenterology Patient Name: Debra Contreras Procedure Date: 05/07/2017 8:21 AM MRN: [id number] Account #: [id number] Date of Birth: [birth year] Admit Type: Outpatient Age: 51 Room: [facility] ENDO ROOM 3 Gender: Female Note Status: Finalized Procedure:            Colonoscopy Indications:          Colon cancer screening in patient at increased risk:                        Family history of 1st-degree relative with colon polyps Providers:            Jonathon Bellows MD, MD Referring MD:         Bethena Roys. Sowles, MD (Referring MD) Medicines:            Monitored Anesthesia Care Complications:        No immediate complications. Procedure:            Pre-Anesthesia Assessment:                       - Prior to the procedure, a History and Physical was                        performed, and patient medications, allergies and                        sensitivities were reviewed. The patient's tolerance of                        previous anesthesia was reviewed.                       - The risks and benefits of the procedure and the                        sedation options and risks were discussed with the                        patient. All questions were answered and informed                        consent was obtained.                       - ASA Grade Assessment: III - A patient with severe                        systemic disease.                       After obtaining informed consent, the colonoscope was                        passed under direct vision. Throughout the procedure,                        the patient's blood pressure, pulse, and oxygen                        saturations were monitored continuously. The  Colonoscope was introduced through the anus and                        advanced to the the cecum, identified by the                        appendiceal orifice, IC valve and transillumination.                        The colonoscopy  was performed with ease. The patient                        tolerated the procedure well. Findings:      The perianal and digital rectal examinations were normal.      A 30 mm, non-bleeding polyp was found in the proximal ascending colon.       The polyp was sessile. To prevent bleeding after mucosal resection, four       hemostatic clips were successfully placed. There was no bleeding during,       or at the end, of the procedure. Preparations were made for mucosal       resection. 18 mL of eleview was injected with adequate lift of the       lesion from the muscularis propria. Snare mucosal resection with Jabier Mutton       net retrieval was performed. A 33 mm area was resected. Resection and       retrieval were complete. There was no bleeding at the end of the       maneuver. To prevent bleeding after mucosal resection, four hemostatic       clips were successfully placed. There was no bleeding during, or at the       end, of the procedure.      A 10 mm polyp was found in the sigmoid colon. The polyp was       pedunculated. To prevent bleeding after the polypectomy, one hemostatic       clip was successfully placed. There was no bleeding during, or at the       end, of the procedure.      Two semi-sessile polyps were found in the ascending colon and cecum. The       polyps were 10 to 12 mm in size. Polypectomy was not attempted due to       spasms of the colon and prolonged duration of the procedure      The exam was otherwise without abnormality. Impression:           - One 30 mm, non-bleeding polyp in the proximal                        ascending colon. Clips were placed.                       - One 10 mm polyp in the sigmoid colon. Clip was placed.                       - Two 10 to 12 mm polyps in the ascending colon and in                        the cecum. Resection not attempted.                       -  The examination was otherwise normal.                       - No specimens  collected. Recommendation:       - Discharge patient to home (with escort).                       - Resume previous diet.                       - No ibuprofen, naproxen, or other non-steroidal                        anti-inflammatory drugs for 6 weeks after polyp removal. Procedure Code(s):    --- Professional ---                       [phone], Colonoscopy, flexible; with endoscopic mucosal                        resection Diagnosis Code(s):    --- Professional ---                       Z83.71, Family history of colonic polyps                       D12.5, Benign neoplasm of sigmoid colon                       D12.2, Benign neoplasm of ascending colon                       D12.0, Benign neoplasm of cecum CPT copyright 2016 American Medical Association. All rights reserved. The codes documented in this report are preliminary and upon coder review may  be revised to meet current compliance requirements. Jonathon Bellows, MD Jonathon Bellows MD, MD 05/07/2017 9:25:27 AM This report has been signed electronically. Number of Addenda: 0 Note Initiated On: 05/07/2017 8:21 AM Scope Withdrawal Time: 0 hours 44 minutes 6 seconds  Total Procedure Duration: 0 hours 52 minutes 30 seconds       [facility]

## 2017-05-10 ENCOUNTER — Encounter: Payer: Self-pay | Admitting: Gastroenterology

## 2017-05-10 LAB — SURGICAL PATHOLOGY

## 2017-05-11 ENCOUNTER — Other Ambulatory Visit: Payer: Self-pay

## 2017-05-11 ENCOUNTER — Telehealth: Payer: Self-pay | Admitting: Gastroenterology

## 2017-05-11 DIAGNOSIS — K635 Polyp of colon: Secondary | ICD-10-CM

## 2017-05-11 NOTE — Telephone Encounter (Signed)
Patient has a question regarding the colonoscopy report that she was given before she left her colonoscopy. Please call

## 2017-06-14 ENCOUNTER — Telehealth: Payer: Self-pay | Admitting: Gastroenterology

## 2017-06-14 ENCOUNTER — Other Ambulatory Visit: Payer: Self-pay | Admitting: Family Medicine

## 2017-06-14 DIAGNOSIS — R809 Proteinuria, unspecified: Secondary | ICD-10-CM

## 2017-06-14 NOTE — Telephone Encounter (Signed)
Patient LVM to cancel her procedure on Friday 06/18/17.

## 2017-06-18 ENCOUNTER — Ambulatory Visit: Payer: BLUE CROSS/BLUE SHIELD | Admitting: Family Medicine

## 2017-06-18 ENCOUNTER — Encounter: Admission: RE | Payer: Self-pay | Source: Ambulatory Visit

## 2017-06-18 ENCOUNTER — Ambulatory Visit
Admission: RE | Admit: 2017-06-18 | Payer: BLUE CROSS/BLUE SHIELD | Source: Ambulatory Visit | Admitting: Gastroenterology

## 2017-06-18 SURGERY — COLONOSCOPY WITH PROPOFOL
Anesthesia: General

## 2017-06-20 ENCOUNTER — Emergency Department
Admission: EM | Admit: 2017-06-20 | Discharge: 2017-06-20 | Disposition: A | Discharge status: Home / Self Care | Payer: BLUE CROSS/BLUE SHIELD | Attending: Emergency Medicine | Admitting: Emergency Medicine

## 2017-06-20 ENCOUNTER — Other Ambulatory Visit: Payer: Self-pay

## 2017-06-20 DIAGNOSIS — W208XXA Other cause of strike by thrown, projected or falling object, initial encounter: Secondary | ICD-10-CM | POA: Insufficient documentation

## 2017-06-20 DIAGNOSIS — Z79899 Other long term (current) drug therapy: Secondary | ICD-10-CM | POA: Insufficient documentation

## 2017-06-20 DIAGNOSIS — I10 Essential (primary) hypertension: Secondary | ICD-10-CM | POA: Insufficient documentation

## 2017-06-20 DIAGNOSIS — Y929 Unspecified place or not applicable: Secondary | ICD-10-CM | POA: Insufficient documentation

## 2017-06-20 DIAGNOSIS — Y999 Unspecified external cause status: Secondary | ICD-10-CM | POA: Insufficient documentation

## 2017-06-20 DIAGNOSIS — S0990XA Unspecified injury of head, initial encounter: Secondary | ICD-10-CM

## 2017-06-20 DIAGNOSIS — E119 Type 2 diabetes mellitus without complications: Secondary | ICD-10-CM | POA: Diagnosis not present

## 2017-06-20 DIAGNOSIS — Z91040 Latex allergy status: Secondary | ICD-10-CM | POA: Diagnosis not present

## 2017-06-20 DIAGNOSIS — Y939 Activity, unspecified: Secondary | ICD-10-CM | POA: Insufficient documentation

## 2017-06-20 DIAGNOSIS — S0081XA Abrasion of other part of head, initial encounter: Secondary | ICD-10-CM | POA: Diagnosis not present

## 2017-06-20 NOTE — ED Provider Notes (Signed)
[facility] Emergency Department Provider Note  ____________________________________________  Time seen: Approximately 8:25 PM  I have reviewed the triage vital signs and the nursing notes.   HISTORY  Chief Complaint Head Injury    HPI Debra Contreras is a 51 y.o. female presents to the emergency department after a "fertility mask" fell on patient's head.  Patient sustained a 1 cm abrasion that bled profusely, which concerned patient.  She denies loss of consciousness.  She denies blurry vision, nausea, vomiting or disorientation.  Patient has been ambulating without difficulty.  She denies neck pain, weakness, radiculopathy or changes in sensation in the upper or lower extremities.  No prior history of TBI.   Past Medical History:  Diagnosis Date  . Allergy   . Diabetes mellitus without complication ([place])   . GERD (gastroesophageal reflux disease)   . Hiatal hernia   . History of PCOS   . Hyperlipidemia   . Hypertension   . Kidney stones   . Microalbuminuria    DM  . Sleep apnea   . Snoring   . Vitamin D deficiency     Patient Active Problem List   Diagnosis Date Noted  . 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 History of partial thyroidectomy 10/05/2015  . H/O thyroid nodule 10/03/2015  . Morbid obesity ([place]) 06/04/2015  . Breast lump in female 05/17/2015  . Acanthosis nigricans 03/09/2015  . Allergic rhinitis, seasonal 03/09/2015  . Diabetes mellitus with renal manifestation ([place]) 03/09/2015  . Dyslipidemia 03/09/2015  . Gastro-esophageal reflux disease without esophagitis 03/09/2015  . Hiatal hernia 03/09/2015  . History of anemia 03/09/2015  . H/O: hysterectomy 03/09/2015  . Uterus, adenomyosis 03/09/2015  . Calculus of kidney 03/09/2015  . Benign hypertension 03/09/2015  . Extreme obesity 03/09/2015  . Vitamin D deficiency 03/09/2015  . Apnea, sleep 09/12/2014  . Microalbuminuria 05/29/2014    Past Surgical History:   Procedure Laterality Date  . ABDOMINAL HYSTERECTOMY  03/2012  . BREAST BIOPSY Right 12/2005   Normal-Cyst  . BREAST SURGERY Bilateral 1990   Reduction  . COLONOSCOPY WITH PROPOFOL N/A 05/07/2017   Performed by Jonathon Bellows, MD at [facility]  . OOPHORECTOMY Left 03/2011  . REDUCTION MAMMAPLASTY    . SLEEVE GASTROPLASTY  09/2016   [place]  . thyroidectomy Left 10/08/2015    Prior to Admission medications   Medication Sig Start Date End Date Taking? Authorizing Provider  Cholecalciferol (VITAMIN D) 2000 units CAPS Take 1 capsule (2,000 Units total) by mouth daily. 12/08/16   Hubbard Hartshorn, FNP  hydrocortisone (PROCTOCORT) 1 % CREA Apply 1 g topically 2 (two) times daily. 06/04/15   Steele Sizer, MD  lisinopril (PRINIVIL,ZESTRIL) 5 MG tablet TAKE 1 TABLET BY MOUTH ONCE DAILY 06/14/17   Steele Sizer, MD  loratadine (CLARITIN) 10 MG tablet Take by mouth.    [provider]  ranitidine (ZANTAC) 150 MG tablet Take 1 tablet (150 mg total) by mouth 2 (two) times daily as needed for heartburn. alternating with Omeprazole Patient taking differently: Take 150 mg by mouth daily. alternating with Omeprazole 06/17/16   Steele Sizer, MD    Allergies Latex  Family History  Problem Relation Age of Onset  . Hypertension Mother   . Cirrhosis Mother   . COPD Mother        Heavy Smoker  . Hepatitis C Mother   . Alcohol abuse Father   . Heart disease Father   . Heart attack Father   . Diabetes Brother   .  Obesity Brother   . Lung cancer Maternal Grandmother        Snuff  . Diabetes Maternal Grandmother   . Alcoholism Maternal Grandmother   . Prostate cancer Paternal Grandfather   . Alcoholism Maternal Grandfather   . Diabetes Maternal Grandfather     Social History Social History   Tobacco Use  . Smoking status: Never Smoker  . Smokeless tobacco: Never Used  Substance Use Topics  . Alcohol use: Yes    Alcohol/week: 0.0 oz    Comment: wine-social drinker  . Drug use:  No     Review of Systems  Constitutional: No fever/chills Eyes: No visual changes. No discharge ENT: No upper respiratory complaints. Cardiovascular: no chest pain. Respiratory: no cough. No SOB. Gastrointestinal: No abdominal pain.  No nausea, no vomiting.  No diarrhea.  No constipation. Musculoskeletal: Negative for musculoskeletal pain. Skin: Patient has abrasion and mild focal edema of the forehead. Neurological: Negative for headaches, focal weakness or numbness.   ____________________________________________   PHYSICAL EXAM:  VITAL SIGNS: ED Triage Vitals [06/20/17 1930]  Enc Vitals Group     BP (!) 135/100     Pulse Rate 80     Resp 17     Temp 98.4 F (36.9 C)     Temp Source Oral     SpO2 98 %     Weight 194 lb (88 kg)     Height 5\' 1"  (1.549 m)     Head Circumference      Peak Flow      Pain Score 6     Pain Loc      Pain Edu?      Excl. in [place]?      Constitutional: Alert and oriented. Well appearing and in no acute distress. Eyes: Conjunctivae are normal. PERRL. EOMI. Head: Atraumatic. ENT:      Ears: TMs are pearly bilaterally.      Nose: No congestion/rhinnorhea.      Mouth/Throat: Mucous membranes are moist.  Neck: Full range of motion with no tenderness elicited with palpation of the C-spine. Cardiovascular: Normal rate, regular rhythm. Normal S1 and S2.  Good peripheral circulation. Respiratory: Normal respiratory effort without tachypnea or retractions. Lungs CTAB. Good air entry to the bases with no decreased or absent breath sounds. Musculoskeletal: Full range of motion to all extremities. No gross deformities appreciated. Neurologic:  Normal speech and language. No gross focal neurologic deficits are appreciated.  Skin:  Skin is warm, dry and intact. No rash noted. Psychiatric: Mood and affect are normal. Speech and behavior are normal. Patient exhibits appropriate insight and judgement.   ____________________________________________    LABS (all labs ordered are listed, but only abnormal results are displayed)  Labs Reviewed - No data to display ____________________________________________  EKG   ____________________________________________  RADIOLOGY   No results found.  ____________________________________________    PROCEDURES  Procedure(s) performed:    Procedures    Medications - No data to display   ____________________________________________   INITIAL IMPRESSION / ASSESSMENT AND PLAN / ED COURSE  Pertinent labs & imaging results that were available during my care of the patient were reviewed by me and considered in my medical decision making (see chart for details).  Review of the [place] CSRS was performed in accordance of the [place] prior to dispensing any controlled drugs.     Assessment and plan Head contusion Patient presented to the emergency department after a "fertility mask" fell on patient.  Patient experienced no loss of  consciousness, blurry vision, nausea, vomiting or effusion.  CT head is not warranted at this time.  Abrasion localized to forehead does not need laceration repair.  Supportive measures were encouraged and patient declined pain medication in the emergency department.  Vital signs were reassuring prior to discharge.  All patient questions were answered.  ____________________________________________  FINAL CLINICAL IMPRESSION(S) / ED DIAGNOSES  Final diagnoses:  Minor head injury, initial encounter      NEW MEDICATIONS STARTED DURING THIS VISIT:  ED Discharge Orders    None          This chart was dictated using voice recognition software/Dragon. Despite best efforts to proofread, errors can occur which can change the meaning. Any change was purely unintentional.    Karren Cobble 06/20/17 2123    Carrie Mew, MD 06/22/17 [phone]

## 2017-06-20 NOTE — ED Triage Notes (Addendum)
Pt arrives to ED via POV from home with c/o head injury when something fell from a shelf and hit her in the head. Pt denies LOC; pt has a 1/4" laceration to the center of her forehead at the hairline. No bleeding at this time. Pt states a "fertility mask" was what fell and hit her and weighs 5-8 lbs.

## 2017-06-20 NOTE — ED Notes (Signed)
Patient has a 1 cm laceration to upper forehead; slight swelling/bruising noted to area. Patient reports a heavy mask fell of the shelf and struck her on the head. Patient denies LOC, dizziness, nausea, visual changes.

## 2017-06-20 NOTE — ED Notes (Signed)
Patient unable to sign e-signature because file was locked by registration. Patient denied any questions or concerns.

## 2017-07-09 ENCOUNTER — Encounter: Payer: Self-pay | Admitting: Family Medicine

## 2017-07-09 ENCOUNTER — Ambulatory Visit: Payer: BLUE CROSS/BLUE SHIELD | Admitting: Family Medicine

## 2017-07-09 VITALS — BP 122/80 | HR 71 | Resp 14 | Ht 61.0 in | Wt 192.1 lb

## 2017-07-09 DIAGNOSIS — Z9889 Other specified postprocedural states: Secondary | ICD-10-CM

## 2017-07-09 DIAGNOSIS — E1129 Type 2 diabetes mellitus with other diabetic kidney complication: Secondary | ICD-10-CM

## 2017-07-09 DIAGNOSIS — I10 Essential (primary) hypertension: Secondary | ICD-10-CM | POA: Diagnosis not present

## 2017-07-09 DIAGNOSIS — Z903 Acquired absence of stomach [part of]: Secondary | ICD-10-CM | POA: Diagnosis not present

## 2017-07-09 DIAGNOSIS — E890 Postprocedural hypothyroidism: Secondary | ICD-10-CM

## 2017-07-09 DIAGNOSIS — R809 Proteinuria, unspecified: Secondary | ICD-10-CM

## 2017-07-09 DIAGNOSIS — E519 Thiamine deficiency, unspecified: Secondary | ICD-10-CM

## 2017-07-09 DIAGNOSIS — E785 Hyperlipidemia, unspecified: Secondary | ICD-10-CM | POA: Diagnosis not present

## 2017-07-09 DIAGNOSIS — Z9009 Acquired absence of other part of head and neck: Secondary | ICD-10-CM

## 2017-07-09 LAB — POCT GLYCOSYLATED HEMOGLOBIN (HGB A1C): Hemoglobin A1C: 5.6

## 2017-07-09 NOTE — Progress Notes (Signed)
Name: Debra Contreras   MRN: [id number]    DOB: 1965-10-10   Date:07/09/2017       Progress Note  Subjective  Chief Complaint  Chief Complaint  Patient presents with  . Diabetes  . Hypertension    HPI   Thyroid nodule: she was sitting on her desk working on a report and felt a large nodule on the left side back in October 2016, she had a partial thyroidectomy in March 2017, and is doing well since. Last TSH was at goal; no skin/hair/nail changes, no fatigue, no heart palpitations. She feels cold all the time  DMII with renal manifestation: she is doing well, no longer taking Metformin but still takes Lisinopril 5mg  for proteinuria., last urine micro was 50, today it is also 50.She is not checking glucose at home. Eye exam is up to date. She denies polyphagia, polydipsia or polyuria.   Obesity:  Starting weight on April 9th, 2016 was 274.4 lbs. Pt has Gastric Sleeve placement 09/16/16 in [place], and has lost 56lbs so far.  She has been monitoring her protein/food intake very carefully - avoids sugar and carbohydrates almost completely, and is walking more, starting to lift weights. She is off B12 supplementation because level was up, advised to resume a few times a week, taking B1 supplementation daily. No anemia on last labs.  Marland Kitchen HTN: BP was high while at [facility] 06/2017 , but she is in pain , had a laceration on her forehead from dropping a heavy suit case on herself.  She is on lisinopril and bp is at goal today, no chest pain, palpitation or SOB  GERD: Restarted Omeprazole after surgery in February, she is planning to wean off again on May 14. Had hernia repaired while in [place], and has not been having reflux symptoms in several months. She still has Ranitidine daily, advised to try taking prn.   OSA: not using CPAP machine because she can't afford it. Discussed importance of weight loss and risk of stroke and heart attacks.   Dyslipidemia: Has not taken statin since 09/15/16. Has  been eating healthier, labs improved, we will monitor for now. She does not want to resume statin therapy at this time  Polypectomy: she needs to return to have two more polyps resected, she will contact Dr. Vicente Males  Hemorrhoids: she states that last week she had a bulging on anal area, symptoms resolved with sitz baths. She states it was secondary to straining   Patient Active Problem List   Diagnosis Date Noted  . 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 History of partial thyroidectomy 10/05/2015  . H/O thyroid nodule 10/03/2015  . Morbid obesity ([place]) 06/04/2015  . Breast lump in female 05/17/2015  . Acanthosis nigricans 03/09/2015  . Allergic rhinitis, seasonal 03/09/2015  . Diabetes mellitus with renal manifestation ([place]) 03/09/2015  . Dyslipidemia 03/09/2015  . Gastro-esophageal reflux disease without esophagitis 03/09/2015  . Hiatal hernia 03/09/2015  . History of anemia 03/09/2015  . H/O: hysterectomy 03/09/2015  . Uterus, adenomyosis 03/09/2015  . Calculus of kidney 03/09/2015  . Benign hypertension 03/09/2015  . Extreme obesity 03/09/2015  . Vitamin D deficiency 03/09/2015  . Apnea, sleep 09/12/2014  . Microalbuminuria 05/29/2014    Past Surgical History:  Procedure Laterality Date  . ABDOMINAL HYSTERECTOMY  03/2012  . BREAST BIOPSY Right 12/2005   Normal-Cyst  . BREAST SURGERY Bilateral 1990   Reduction  . COLONOSCOPY WITH PROPOFOL N/A 05/07/2017   Procedure: COLONOSCOPY WITH PROPOFOL;  Surgeon: Jonathon Bellows,  MD;  Location: [facility] ENDOSCOPY;  Service: Gastroenterology;  Laterality: N/A;  . OOPHORECTOMY Left 03/2011  . REDUCTION MAMMAPLASTY    . SLEEVE GASTROPLASTY  09/2016   [place]  . thyroidectomy Left 10/08/2015    Family History  Problem Relation Age of Onset  . Hypertension Mother   . Cirrhosis Mother   . COPD Mother        Heavy Smoker  . Hepatitis C Mother   . Alcohol abuse Father   . Heart disease Father   . Heart attack Father   . Diabetes Brother    . Obesity Brother   . Lung cancer Maternal Grandmother        Snuff  . Diabetes Maternal Grandmother   . Alcoholism Maternal Grandmother   . Prostate cancer Paternal Grandfather   . Alcoholism Maternal Grandfather   . Diabetes Maternal Grandfather     Social History   Socioeconomic History  . Marital status: Divorced    Spouse name: Not on file  . Number of children: Not on file  . Years of education: Not on file  . Highest education level: Not on file  Social Needs  . Financial resource strain: Not on file  . Food insecurity - worry: Not on file  . Food insecurity - inability: Not on file  . Transportation needs - medical: Not on file  . Transportation needs - non-medical: Not on file  Occupational History  . Not on file  Tobacco Use  . Smoking status: Never Smoker  . Smokeless tobacco: Never Used  Substance and Sexual Activity  . Alcohol use: Yes    Alcohol/week: 0.0 oz    Comment: wine-social drinker  . Drug use: No  . Sexual activity: Yes    Partners: Male  Other Topics Concern  . 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    She is a Designer, jewellery.      Current Outpatient Medications:  .  Cholecalciferol (VITAMIN D) 2000 units CAPS, Take 1 capsule (2,000 Units total) by mouth daily., Disp: 30 capsule, Rfl:  .  hydrocortisone (PROCTOCORT) 1 % CREA, Apply 1 g topically 2 (two) times daily., Disp: 1 Tube, Rfl: 0 .  lisinopril (PRINIVIL,ZESTRIL) 5 MG tablet, TAKE 1 TABLET BY MOUTH ONCE DAILY, Disp: 90 tablet, Rfl: 1 .  loratadine (CLARITIN) 10 MG tablet, Take by mouth., Disp: , Rfl:  .  ranitidine (ZANTAC) 150 MG tablet, Take 1 tablet (150 mg total) by mouth 2 (two) times daily as needed for heartburn. alternating with Omeprazole (Patient taking differently: Take 150 mg by mouth daily. alternating with Omeprazole), Disp: 60 tablet, Rfl: 5  Allergies  Allergen Reactions  . Latex Rash      ROS  Constitutional: Negative for fever, positive  weight change.  Respiratory: Negative for cough and shortness of breath.   Cardiovascular: Negative for chest pain or palpitations.  Gastrointestinal: Negative for abdominal pain, no bowel changes.  Musculoskeletal: Negative for gait problem or joint swelling.  Skin: Negative for rash.  Neurological: Negative for dizziness or headache.  No other specific complaints in a complete review of systems (except as listed in HPI above).  Objective  Vitals:   07/09/17 0745  BP: 122/80  Pulse: 71  Resp: 14  SpO2: 99%  Weight: 192 lb 1.6 oz (87.1 kg)  Height: 5\' 1"  (1.549 m)    Body mass index is 36.3 kg/m.  Physical Exam  Constitutional: Patient  appears well-developed and well-nourished. Obese No distress.  HEENT: head atraumatic, normocephalic, pupils equal and reactive to light,  neck supple, throat within normal limits Cardiovascular: Normal rate, regular rhythm and normal heart sounds.  No murmur heard. No BLE edema. Pulmonary/Chest: Effort normal and breath sounds normal. No respiratory distress. Abdominal: Soft.  There is no tenderness. Psychiatric: Patient has a normal mood and affect. behavior is normal. Judgment and thought content normal.  Recent Results (from the past 2160 hour(s))  Surgical pathology     Status: None   Collection Time: 05/07/17  8:35 AM  Result Value Ref Range   SURGICAL PATHOLOGY      Surgical Pathology CASE: [id number] PATIENT: [facility] Surgical Pathology Report     SPECIMEN SUBMITTED: A. Colon polyp, large ascending; hot snare B. Colon polyp, sigmoid; hot snare  CLINICAL HISTORY: None provided  PRE-OPERATIVE DIAGNOSIS: Screening colonoscopy. Z12.11  POST-OPERATIVE DIAGNOSIS: Colon polyps     DIAGNOSIS: A. COLON POLYP, LARGE ASCENDING; HOT SNARE: - TUBULAR ADENOMA. - NEGATIVE FOR HIGH-GRADE DYSPLASIA AND MALIGNANCY.  B. COLON POLYP, SIGMOID; HOT SNARE: -  TUBULAR ADENOMA. - NEGATIVE FOR HIGH GRADE DYSPLASIA AND MALIGNANCY. - INKED CAUTERIZED BASE IS FREE OF DYSPLASIA.   GROSS DESCRIPTION:  A. Labeled: hot snare large ascending colon polyp  Tissue fragment(s): multiple  Size: aggregate, 2.0 x 1.3 x 0.8 cm  Description: tan fragments and fecal material, largest fragment marked blue at possible base area and sectioned  Entirely submitted in 1-2 cassette(s).   B. Labeled: hot snare sigmoid colon polyp  Tissu e fragment(s): 1  Size: 1.0 x 0.8 x 0.5 cm  Description: purple tan polypoid fragment, marked blue at the base and bisected  Entirely submitted in one cassette(s).    Final Diagnosis performed by Quay Burow, MD.  Electronically signed 05/10/2017 9:54:02AM    The electronic signature indicates that the named Attending Pathologist has evaluated the specimen  Technical component performed at [facility], [address] [place], [place] [address] Lab: [phone] Dir: Darrick Penna. Evette Doffing, MD  Professional component performed at [facility], [facility], [place], [place], [place] [address] Lab: [phone] Dir: Dellia Nims. Rubinas, MD    POCT HgB A1C     Status: Normal   Collection Time: 07/09/17  7:57 AM  Result Value Ref Range   Hemoglobin A1C 5.6      PHQ2/9: Depression screen [facility] 2/9 03/18/2017 06/17/2016 03/16/2016 09/17/2015 05/17/2015  Decreased Interest 0 0 0 0 0  Down, Depressed, Hopeless 0 0 0 0 0  PHQ - 2 Score 0 0 0 0 0     Fall Risk: Fall Risk  07/09/2017 03/18/2017 06/17/2016 03/16/2016 09/17/2015  Falls in the past year? No No No No No    Functional Status Survey: Is the patient deaf or have difficulty hearing?: No Does the patient have difficulty seeing, even when wearing glasses/contacts?: No Does the patient have difficulty concentrating, remembering, or making decisions?: No Does the patient have difficulty walking or climbing stairs?: No Does the patient have difficulty  dressing or bathing?: No Does the patient have difficulty doing errands alone such as visiting a doctor's office or shopping?: No   Assessment & Plan   1. Type 2 diabetes mellitus with microalbuminuria, without long-term current use of insulin (HCC)  - POCT HgB A1C  2. History of sleeve gastrectomy  Continue healthy life style changes  3. Dyslipidemia  Off statin therapy   4. Microalbuminuria  Recheck next visit   5. Morbid obesity (  [place])  S/p bariatric surgery and doing well   6. H/O partial  thyroidectomy  - TSH  7. Vitamin B1 deficiency  - Vitamin B1, whole blood

## 2017-08-19 IMAGING — US US SOFT TISSUE HEAD/NECK
1 series · 14 of 25 positions shown · non-contrast
Comparison: None.

CLINICAL DATA: Left thyroid nodule

EXAM:
THYROID ULTRASOUND
TECHNIQUE: Ultrasound examination of the thyroid gland and adjacent soft
tissues was performed.

[Series 1: us soft tissue head/neck · 0.08mm/px · 14 of 48 slices shown]
[im 1/48]
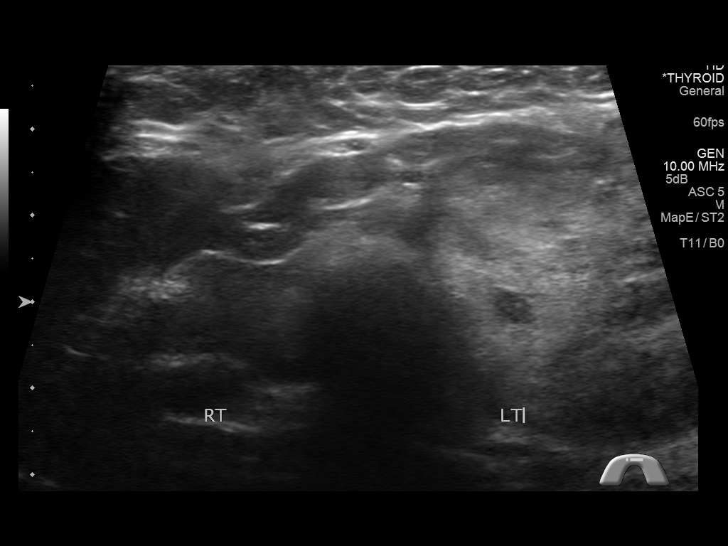
[im 4/48]
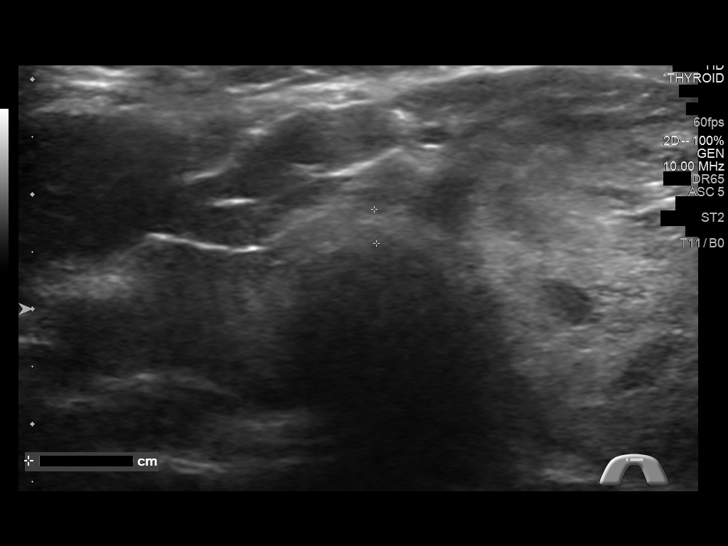
[im 8/48]
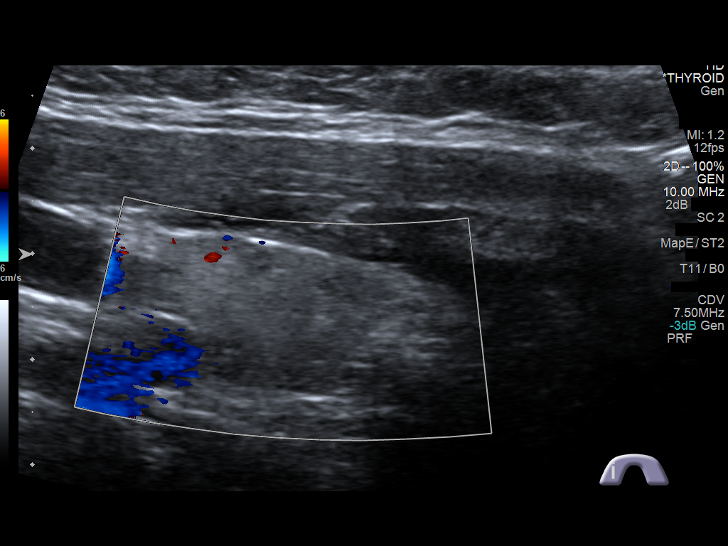
[im 12/48]
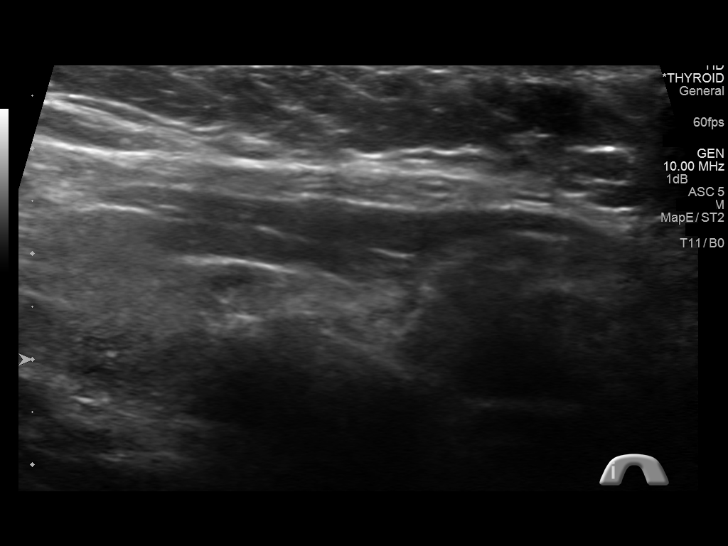
[im 16/48]
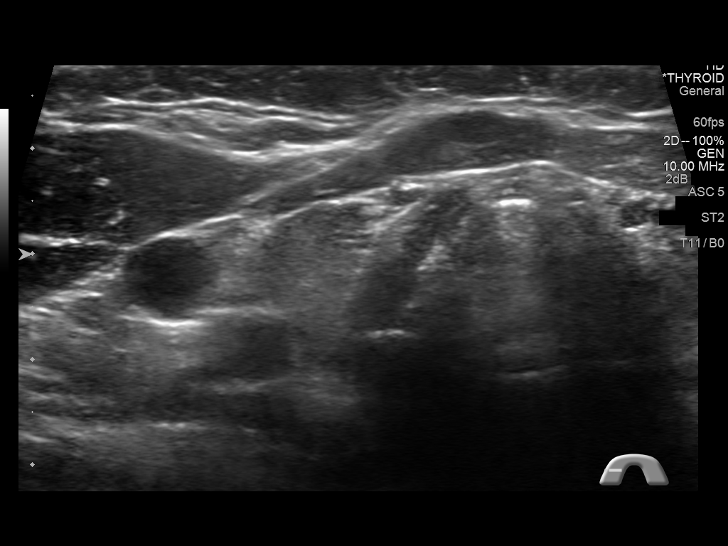
[im 18/48]
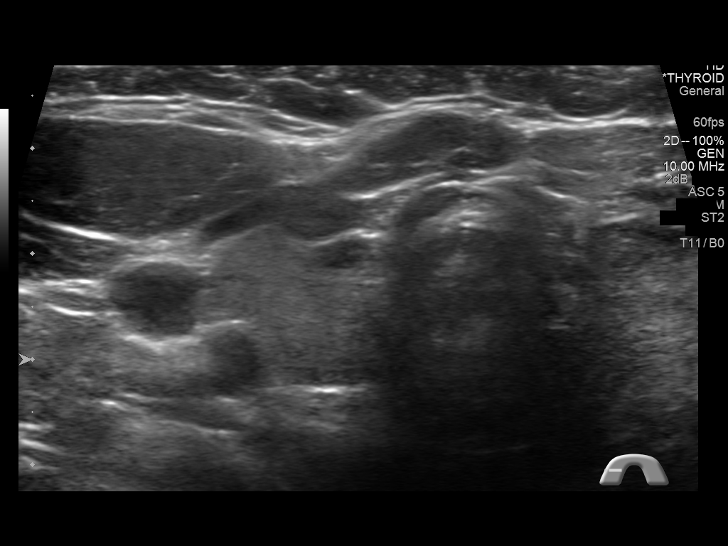
[im 22/48]
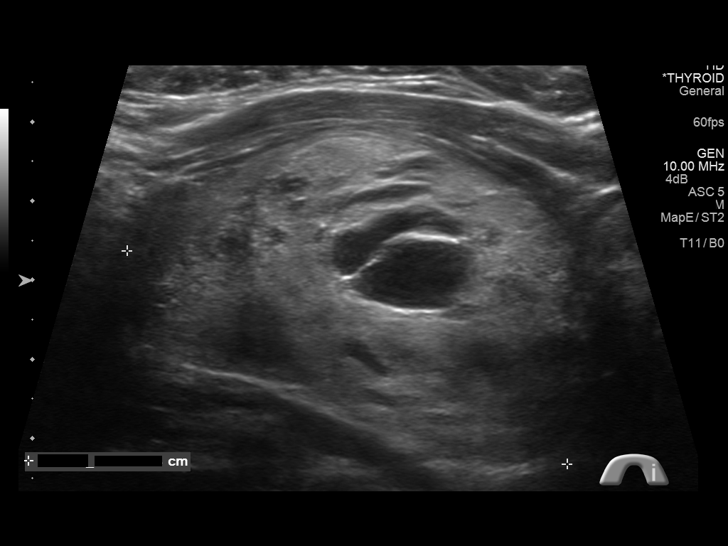
[im 26/48]
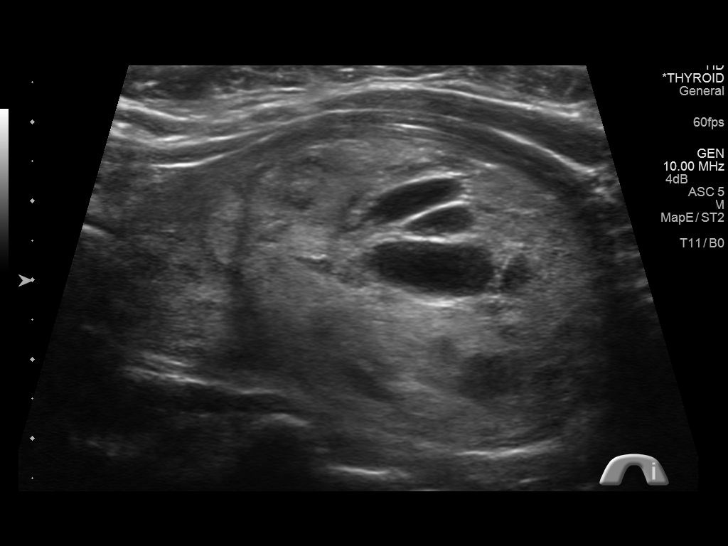
[im 30/48]
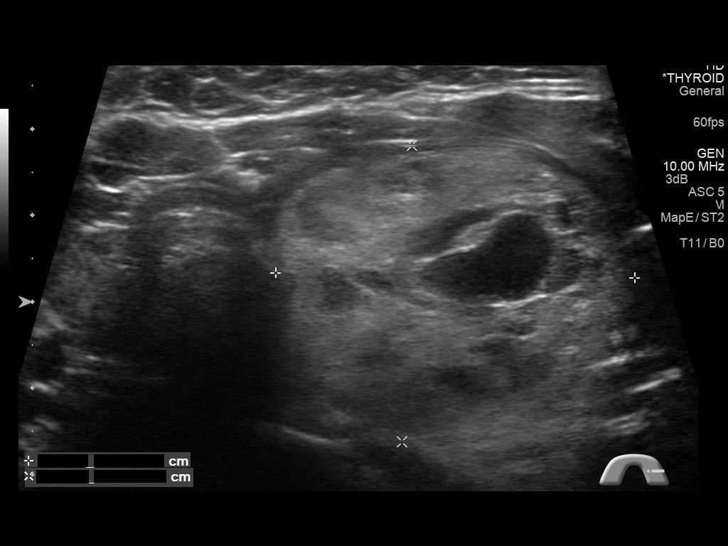
[im 32/48]
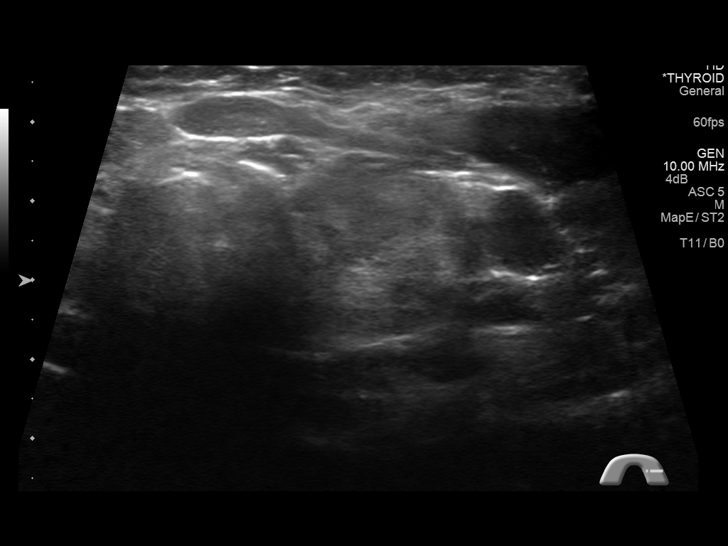
[im 36/48]
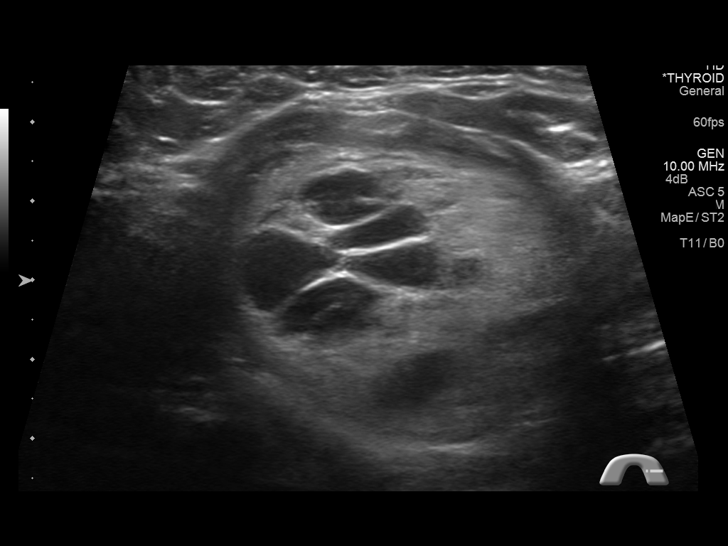
[im 40/48]
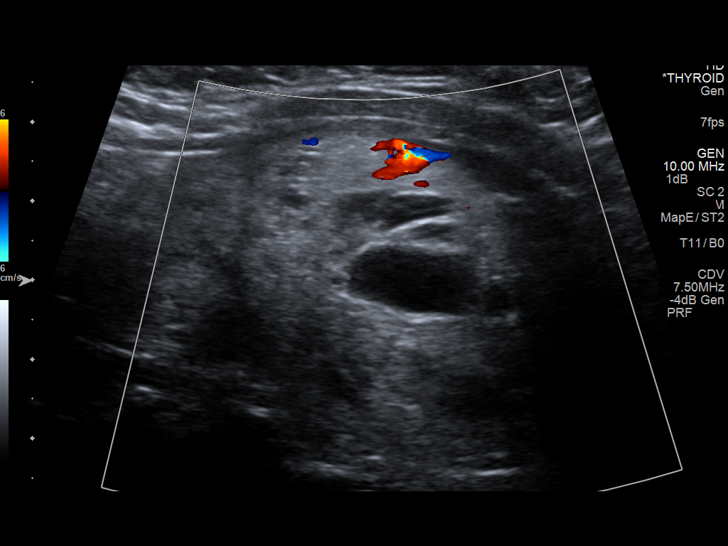
[im 44/48]
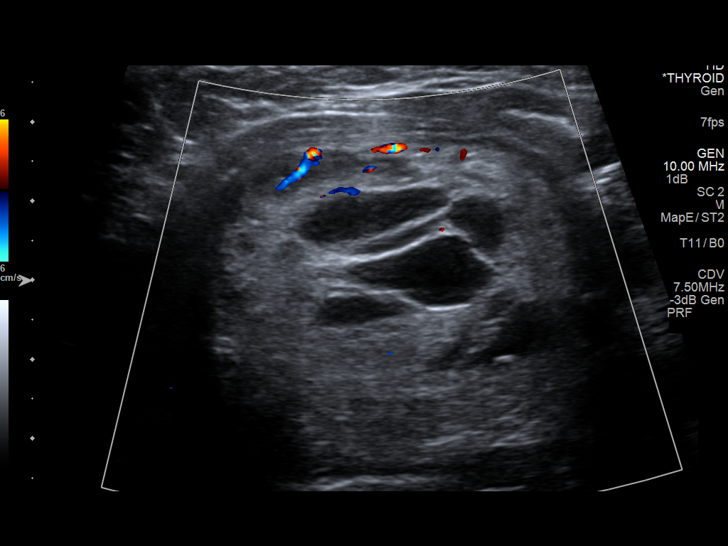
[im 48/48]
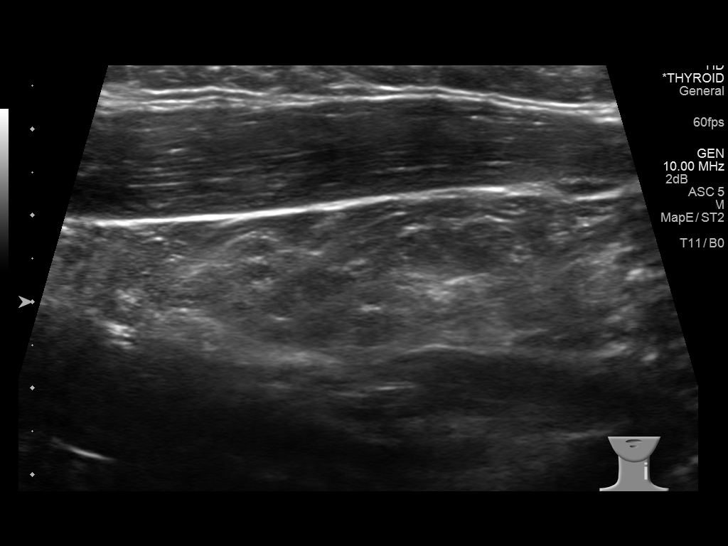

[14 of 25 positions shown; findings below may reference images not displayed]

FINDINGS: Right thyroid lobe

Measurements: 26 x 15 x 14 mm. Mildly inhomogeneous echotexture
without dominant mass or nodule.

Left thyroid lobe

Measurements: 62 x 34 x 42 mm. Solitary 49 x 38 x 45 mm complex
mostly solid nodule, mid lobe.

Isthmus

Thickness: 3 mm.  No nodules visualized.

Lymphadenopathy

None visualized.
IMPRESSION: 1. Dominant left thyroid mass. Findings meet consensus criteria for
biopsy. Ultrasound-guided fine needle aspiration should be
considered, as per the consensus statement: Management of Thyroid
Nodules Detected at US: Society of Radiologists in Ultrasound

## 2017-08-23 ENCOUNTER — Encounter: Payer: Self-pay | Admitting: Emergency Medicine

## 2017-08-23 ENCOUNTER — Encounter: Payer: Self-pay | Admitting: Family Medicine

## 2017-08-23 ENCOUNTER — Ambulatory Visit: Payer: PRIVATE HEALTH INSURANCE | Admitting: Family Medicine

## 2017-08-23 VITALS — BP 124/82 | HR 76 | Temp 98.8°F | Resp 16 | Ht 61.0 in | Wt 187.4 lb

## 2017-08-23 DIAGNOSIS — J069 Acute upper respiratory infection, unspecified: Secondary | ICD-10-CM | POA: Diagnosis not present

## 2017-08-23 DIAGNOSIS — N3001 Acute cystitis with hematuria: Secondary | ICD-10-CM | POA: Diagnosis not present

## 2017-08-23 DIAGNOSIS — Z87442 Personal history of urinary calculi: Secondary | ICD-10-CM

## 2017-08-23 DIAGNOSIS — R1030 Lower abdominal pain, unspecified: Secondary | ICD-10-CM

## 2017-08-23 LAB — POCT URINALYSIS DIPSTICK
Bilirubin, UA: NEGATIVE
GLUCOSE UA: NEGATIVE
Ketones, UA: NEGATIVE
Nitrite, UA: NEGATIVE
Spec Grav, UA: 1.025 (ref 1.010–1.025)
Urobilinogen, UA: NEGATIVE E.U./dL — AB
pH, UA: 5 (ref 5.0–8.0)

## 2017-08-23 MED ORDER — BENZONATATE 100 MG PO CAPS: 100.0000 mg | ORAL_CAPSULE | Freq: Three times a day (TID) | ORAL | 0 refills | Status: DC | PRN

## 2017-08-23 MED ORDER — CIPROFLOXACIN HCL 250 MG PO TABS: 250.0000 mg | ORAL_TABLET | Freq: Two times a day (BID) | ORAL | 0 refills | Status: AC

## 2017-08-23 MED ORDER — FLUTICASONE PROPIONATE 50 MCG/ACT NA SUSP: 2.0000 | Freq: Every day | NASAL | 6 refills | Status: DC

## 2017-08-23 NOTE — Patient Instructions (Addendum)
Abdominal Pain, Adult Many things can cause belly (abdominal) pain. Most times, belly pain is not dangerous. Many cases of belly pain can be watched and treated at home. Sometimes belly pain is serious, though. Your doctor will try to find the cause of your belly pain. Follow these instructions at home:  Take over-the-counter and prescription medicines only as told by your doctor. Do not take medicines that help you poop (laxatives) unless told to by your doctor.  Drink enough fluid to keep your pee (urine) clear or pale yellow.  Watch your belly pain for any changes.  Keep all follow-up visits as told by your doctor. This is important. Contact a doctor if:  Your belly pain changes or gets worse.  You are not hungry, or you lose weight without trying.  You are having trouble pooping (constipated) or have watery poop (diarrhea) for more than 2-3 days.  You have pain when you pee or poop.  Your belly pain wakes you up at night.  Your pain gets worse with meals, after eating, or with certain foods.  You are throwing up and cannot keep anything down.  You have a fever. Get help right away if:  Your pain does not go away as soon as your doctor says it should.  You cannot stop throwing up.  Your pain is only in areas of your belly, such as the right side or the left lower part of the belly.  You have bloody or black poop, or poop that looks like tar.  You have very bad pain, cramping, or bloating in your belly.  You have signs of not having enough fluid or water in your body (dehydration), such as: ? Dark pee, very little pee, or no pee. ? Cracked lips. ? Dry mouth. ? Sunken eyes. ? Sleepiness. ? Weakness. This information is not intended to replace advice given to you by your health care provider. Make sure you discuss any questions you have with your health care provider. Document Released: 01/06/2008 Document Revised: 02/07/2016 Document Reviewed: 01/01/2016 Elsevier  Interactive Patient Education  2018 [place].  Cough, Adult A cough helps to clear your throat and lungs. A cough may last only 2-3 weeks (acute), or it may last longer than 8 weeks (chronic). Many different things can cause a cough. A cough may be a sign of an illness or another medical condition. Follow these instructions at home:  Pay attention to any changes in your cough.  Take medicines only as told by your doctor. ? If you were prescribed an antibiotic medicine, take it as told by your doctor. Do not stop taking it even if you start to feel better. ? Talk with your doctor before you try using a cough medicine.  Drink enough fluid to keep your pee (urine) clear or pale yellow.  If the air is dry, use a cold steam vaporizer or humidifier in your home.  Stay away from things that make you cough at work or at home.  If your cough is worse at night, try using extra pillows to raise your head up higher while you sleep.  Do not smoke, and try not to be around smoke. If you need help quitting, ask your doctor.  Do not have caffeine.  Do not drink alcohol.  Rest as needed. Contact a doctor if:  You have new problems (symptoms).  You cough up yellow fluid (pus).  Your cough does not get better after 2-3 weeks, or your cough gets worse.  Medicine does not help your cough and you are not sleeping well.  You have pain that gets worse or pain that is not helped with medicine.  You have a fever.  You are losing weight and you do not know why.  You have night sweats. Get help right away if:  You cough up blood.  You have trouble breathing.  Your heartbeat is very fast. This information is not intended to replace advice given to you by your health care provider. Make sure you discuss any questions you have with your health care provider. Document Released: 04/02/2011 Document Revised: 12/26/2015 Document Reviewed: 09/26/2014 Elsevier Interactive Patient Education   2018 [employer].  Urinary Tract Infection, Adult A urinary tract infection (UTI) is an infection of any part of the urinary tract. The urinary tract includes the:  Kidneys.  Ureters.  Bladder.  Urethra.  These organs make, store, and get rid of pee (urine) in the body. Follow these instructions at home:  Take over-the-counter and prescription medicines only as told by your doctor.  If you were prescribed an antibiotic medicine, take it as told by your doctor. Do not stop taking the antibiotic even if you start to feel better.  Avoid the following drinks: ? Alcohol. ? Caffeine. ? Tea. ? Carbonated drinks.  Drink enough fluid to keep your pee clear or pale yellow.  Keep all follow-up visits as told by your doctor. This is important.  Make sure to: ? Empty your bladder often and completely. Do not to hold pee for long periods of time. ? Empty your bladder before and after sex. ? Wipe from front to back after a bowel movement if you are female. Use each tissue one time when you wipe. Contact a doctor if:  You have back pain.  You have a fever.  You feel sick to your stomach (nauseous).  You throw up (vomit).  Your symptoms do not get better after 3 days.  Your symptoms go away and then come back. Get help right away if:  You have very bad back pain.  You have very bad lower belly (abdominal) pain.  You are throwing up and cannot keep down any medicines or water. This information is not intended to replace advice given to you by your health care provider. Make sure you discuss any questions you have with your health care provider. Document Released: 01/06/2008 Document Revised: 12/26/2015 Document Reviewed: 06/10/2015 Elsevier Interactive Patient Education  [employer].

## 2017-08-23 NOTE — Progress Notes (Signed)
Name: Debra Contreras   MRN: [id number]    DOB: 07-May-1966   Date:08/23/2017       Progress Note  Subjective  Chief Complaint  Chief Complaint  Patient presents with  . Cystitis    fever, pain, burning,   . Abdominal Pain    lower abdomen, Di a Teledoc visit on yesterday    HPI  Pt presents with concern for UTI: She had 1 incontinent episode 9 days ago.  She is now having cloudy urine, dysuria, foul smelling urine, right flank/lower abdominal pain, nausea, low-grade temp of 55F at home, some urgency.  Denies vomiting.  Does report decreased appetite and some intermittent diarrhea and constipation.  She does have history of kidney stones in the past; also has history vertical sleeve placement - she states she is unsure if this feels similar to prior episodes of kidney stones or not but that she had a lot of severe pain and vomiting with her kidney stone episodes in the past.  URI: She notes she has been battling respiratory illness for about 3 weeks.  She notes she was feeling better after day 4, then about 2-2.5 weeks ago she started feeling worse again.  Symptoms include nasal congestion, cough, some ear pressure, some sinus pain.  Denies shortness of breath, chest pain.  Patient Active Problem List   Diagnosis Date Noted  . 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 History of partial thyroidectomy 10/05/2015  . H/O thyroid nodule 10/03/2015  . Morbid obesity ([place]) 06/04/2015  . Breast lump in female 05/17/2015  . Acanthosis nigricans 03/09/2015  . Allergic rhinitis, seasonal 03/09/2015  . Diabetes mellitus with renal manifestation ([employer] [facility]) 03/09/2015  . Dyslipidemia 03/09/2015  . Gastro-esophageal reflux disease without esophagitis 03/09/2015  . Hiatal hernia 03/09/2015  . History of anemia 03/09/2015  . H/O: hysterectomy 03/09/2015  . Uterus, adenomyosis 03/09/2015  . Calculus of kidney 03/09/2015  . Benign hypertension 03/09/2015  . Extreme obesity 03/09/2015  . Vitamin D  deficiency 03/09/2015  . Apnea, sleep 09/12/2014  . Microalbuminuria 05/29/2014    Social History   Tobacco Use  . Smoking status: Never Smoker  . Smokeless tobacco: Never Used  Substance Use Topics  . Alcohol use: Yes    Alcohol/week: 0.0 oz    Comment: wine-social drinker     Current Outpatient Medications:  .  Cholecalciferol (VITAMIN D) 2000 units CAPS, Take 1 capsule (2,000 Units total) by mouth daily., Disp: 30 capsule, Rfl:  .  lisinopril (PRINIVIL,ZESTRIL) 5 MG tablet, TAKE 1 TABLET BY MOUTH ONCE DAILY, Disp: 90 tablet, Rfl: 1 .  loratadine (CLARITIN) 10 MG tablet, Take by mouth., Disp: , Rfl:  .  ranitidine (ZANTAC) 150 MG tablet, Take 1 tablet (150 mg total) by mouth 2 (two) times daily as needed for heartburn. alternating with Omeprazole (Patient taking differently: Take 150 mg by mouth daily. alternating with Omeprazole), Disp: 60 tablet, Rfl: 5 .  hydrocortisone (PROCTOCORT) 1 % CREA, Apply 1 g topically 2 (two) times daily. (Patient not taking: Reported on 08/23/2017), Disp: 1 Tube, Rfl: 0  Allergies  Allergen Reactions  . Latex Rash    ROS  Constitutional: Negative for fever or weight change.  Respiratory: Negative for cough and shortness of breath.   Cardiovascular: Negative for chest pain or palpitations.  Gastrointestinal: See HPI Musculoskeletal: Negative for gait problem or joint swelling.  Skin: Negative for rash.  Neurological: Negative for dizziness or headache.  No other specific complaints in a complete review of systems (  except as listed in HPI above).  Objective  Vitals:   08/23/17 0925  BP: 124/82  Pulse: 76  Resp: 16  Temp: 98.8 F (37.1 C)  TempSrc: Oral  SpO2: 97%  Weight: 187 lb 6.4 oz (85 kg)  Height: 5\' 1"  (1.549 m)   Body mass index is 35.41 kg/m.  Nursing Note and Vital Signs reviewed.  Physical Exam Constitutional: Patient appears well-developed and well-nourished. Obese No distress.  HEENT: head atraumatic,  normocephalic, pupils equal and reactive to light, EOM's intact, TM's without erythema or bulging, no maxillary or frontal sinus tenderness , neck supple without lymphadenopathy, oropharynx pink and moist without exudate Cardiovascular: Normal rate, regular rhythm, S1/S2 present.  No murmur or rub heard. No BLE edema. Pulmonary/Chest: Effort normal and breath sounds clear. No respiratory distress or retractions. Abdominal: Soft and RLQ mildly tender without rebound; no HSM, bowel sounds present x4 quadrants.  Equivocal CVA tenderness Psychiatric: Patient has a normal mood and affect. behavior is normal. Judgment and thought content normal.  Results for orders placed or performed in visit on 08/23/17 (from the past 72 hour(s))  POCT urinalysis dipstick     Status: Abnormal   Collection Time: 08/23/17  9:33 AM  Result Value Ref Range   Color, UA gold    Clarity, UA cloudy    Glucose, UA negative    Bilirubin, UA negative    Ketones, UA negative    Spec Grav, UA 1.025 1.010 - 1.025   Blood, UA trace    pH, UA 5.0 5.0 - 8.0   Protein, UA trace    Urobilinogen, UA negative (A) 0.2 or 1.0 E.U./dL   Nitrite, UA negative    Leukocytes, UA Trace (A) Negative   Appearance murky    Odor strong     Assessment & Plan  1. Acute cystitis with hematuria - Urine Culture - ciprofloxacin (CIPRO) 250 MG tablet; Take 1 tablet (250 mg total) by mouth 2 (two) times daily for 3 days.  Dispense: 6 tablet; Refill: 0  2. Lower abdominal pain - POCT urinalysis dipstick - Tylenol as needed for pain  3. Upper respiratory tract infection, unspecified type - fluticasone (FLONASE) 50 MCG/ACT nasal spray; Place 2 sprays into both nostrils daily.  Dispense: 16 g; Refill: 6 - benzonatate (TESSALON PERLES) 100 MG capsule; Take 1-2 capsules (100-200 mg total) by mouth 3 (three) times daily as needed.  Dispense: 30 capsule; Refill: 0 - Lack of bacterial infection findings at today's visit.  We will monitor closely,  try symptomatic care for the time being.  4. History of nephrolithiasis - Discussed option for imaging today, and patient declines, she would like to try antibiotics first.  We will follow up in 3 days to reassess, however red flags are discussed in detail below and she will present for [facility] or RTC if symptoms worsen or fail to improve.  -Red flags and when to present for emergency care or RTC including fever >101.41F, chest pain, shortness of breath, new/worsening/un-resolving symptoms, moderate to severe abdominal pain, vomiting, moderate to severe flank/back pain, reviewed with patient at time of visit. Follow up and care instructions discussed and provided in AVS. - Return in about 3 days (around 08/26/2017) for UTI Follow up.

## 2017-08-24 LAB — URINE CULTURE
MICRO NUMBER:: 90085372
SPECIMEN QUALITY:: ADEQUATE

## 2017-08-24 LAB — URINALYSIS, MICROSCOPIC ONLY
BACTERIA UA: NONE SEEN /HPF
Hyaline Cast: NONE SEEN /LPF

## 2017-08-26 ENCOUNTER — Ambulatory Visit: Payer: PRIVATE HEALTH INSURANCE | Admitting: Family Medicine

## 2017-10-22 ENCOUNTER — Telehealth: Payer: Self-pay

## 2017-10-22 ENCOUNTER — Ambulatory Visit: Payer: BLUE CROSS/BLUE SHIELD | Admitting: Family Medicine

## 2017-10-22 ENCOUNTER — Encounter: Payer: Self-pay | Admitting: Family Medicine

## 2017-10-22 VITALS — BP 120/80 | HR 99 | Resp 14 | Ht 61.0 in | Wt 190.1 lb

## 2017-10-22 DIAGNOSIS — Z87898 Personal history of other specified conditions: Secondary | ICD-10-CM

## 2017-10-22 DIAGNOSIS — Z9889 Other specified postprocedural states: Secondary | ICD-10-CM | POA: Diagnosis not present

## 2017-10-22 DIAGNOSIS — E538 Deficiency of other specified B group vitamins: Secondary | ICD-10-CM | POA: Diagnosis not present

## 2017-10-22 DIAGNOSIS — E519 Thiamine deficiency, unspecified: Secondary | ICD-10-CM

## 2017-10-22 DIAGNOSIS — E785 Hyperlipidemia, unspecified: Secondary | ICD-10-CM | POA: Diagnosis not present

## 2017-10-22 DIAGNOSIS — E890 Postprocedural hypothyroidism: Secondary | ICD-10-CM

## 2017-10-22 DIAGNOSIS — Z9009 Acquired absence of other part of head and neck: Secondary | ICD-10-CM

## 2017-10-22 DIAGNOSIS — Z903 Acquired absence of stomach [part of]: Secondary | ICD-10-CM

## 2017-10-22 DIAGNOSIS — E1129 Type 2 diabetes mellitus with other diabetic kidney complication: Secondary | ICD-10-CM

## 2017-10-22 DIAGNOSIS — E1169 Type 2 diabetes mellitus with other specified complication: Secondary | ICD-10-CM | POA: Diagnosis not present

## 2017-10-22 DIAGNOSIS — R809 Proteinuria, unspecified: Secondary | ICD-10-CM | POA: Diagnosis not present

## 2017-10-22 DIAGNOSIS — I10 Essential (primary) hypertension: Secondary | ICD-10-CM

## 2017-10-22 DIAGNOSIS — E559 Vitamin D deficiency, unspecified: Secondary | ICD-10-CM

## 2017-10-22 LAB — POCT GLYCOSYLATED HEMOGLOBIN (HGB A1C): Hemoglobin A1C: 5.3

## 2017-10-22 MED ORDER — SCOPOLAMINE 1 MG/3DAYS TD PT72: 1.0000 | MEDICATED_PATCH | TRANSDERMAL | 0 refills | Status: DC

## 2017-10-22 NOTE — Progress Notes (Signed)
Name: Debra Contreras   MRN: [id number]    DOB: 1966-04-20   Date:10/22/2017       Progress Note  Subjective  Chief Complaint  Chief Complaint  Patient presents with  . Diabetes  . Hypertension  . Travel Consult    needs medication for motion sickness, she will be going on a cruise soon.  . Recurrent Skin Infections    HPI  Thyroid nodule: she was sitting on her desk working on a report and felt a large nodule on the left side back in October 2016, she had a partial thyroidectomy in March 2017, and is doing well since. Last TSH was at goal; no skin/hair/nail changes, no fatigue, no heart palpitations, she has hot flashes at times.   DMII with renal manifestation and dyslipidemia: she is doing well, no longer taking Metformin but still takes Lisinopril 5mg  for proteinuria., last urine micro was 50She is not checking glucose at home. Eye exam is up to date. She denies polyphagia, polydipsia or polyuria.   Obesity:  Starting weight on April 9th, 2016 was 274.4 lbs. Pt has Gastric Sleeve placement 09/16/16 in [place], and has lost 84 lbs  so far. She has been monitoring her protein/food intake very carefully - avoids sugar and carbohydrates, and is walking more 30-45 minutes 3 times a week. She is taking B12 three times a week and taking B1 supplementation daily. No anemia on last labs, we will recheck today  . HTN: BP was high while at [facility] 06/2017 , but she is in pain , had a laceration on her forehead from dropping a heavy suit case on herself.  She is on lisinopril and bp is at goal today, no chest pain, palpitation or SOB  GERD:she is off all medications not heartburn or regurgitation, on GERD appropriate diet   OSA: not using CPAP machine because she can't afford it. She also states not symptoms since partial thyroidectomy and weight loss.   Polypectomy: she needs to return to have two more polyps resected, we will contact Dr. Vicente Males  Boils: on on her vulva that ruptured by itself  recently, also has a bump on buttocks that was sore, but not causing problems at this time, denies fever or chills.   Patient Active Problem List   Diagnosis Date Noted  . 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 History of partial thyroidectomy 10/05/2015  . H/O thyroid nodule 10/03/2015  . Morbid obesity ([place]) 06/04/2015  . Breast lump in female 05/17/2015  . Acanthosis nigricans 03/09/2015  . Allergic rhinitis, seasonal 03/09/2015  . Diabetes mellitus with renal manifestation ([place]) 03/09/2015  . Dyslipidemia 03/09/2015  . Gastro-esophageal reflux disease without esophagitis 03/09/2015  . Hiatal hernia 03/09/2015  . History of anemia 03/09/2015  . H/O: hysterectomy 03/09/2015  . Uterus, adenomyosis 03/09/2015  . Calculus of kidney 03/09/2015  . Benign hypertension 03/09/2015  . Extreme obesity 03/09/2015  . Vitamin D deficiency 03/09/2015  . Apnea, sleep 09/12/2014  . Microalbuminuria 05/29/2014    Past Surgical History:  Procedure Laterality Date  . ABDOMINAL HYSTERECTOMY  03/2012  . BREAST BIOPSY Right 12/2005   Normal-Cyst  . BREAST SURGERY Bilateral 1990   Reduction  . COLONOSCOPY WITH PROPOFOL N/A 05/07/2017   Procedure: COLONOSCOPY WITH PROPOFOL;  Surgeon: Jonathon Bellows, MD;  [facility] ENDOSCOPY;  Service: Gastroenterology;  Laterality: N/A;  . OOPHORECTOMY Left 03/2011  . REDUCTION MAMMAPLASTY    . SLEEVE GASTROPLASTY  09/2016   [place]  . thyroidectomy Left 10/08/2015  Family History  Problem Relation Age of Onset  . Hypertension Mother   . Cirrhosis Mother   . COPD Mother        Heavy Smoker  . Hepatitis C Mother   . Alcohol abuse Father   . Heart disease Father   . Heart attack Father   . Diabetes Brother   . Obesity Brother   . Lung cancer Maternal Grandmother        Snuff  . Diabetes Maternal Grandmother   . Alcoholism Maternal Grandmother   . Prostate cancer Paternal Grandfather   . Alcoholism Maternal Grandfather   . Diabetes Maternal  Grandfather     Social History   Socioeconomic History  . Marital status: Divorced    Spouse name: Not on file  . Number of children: Not on file  . Years of education: Not on file  . Highest education level: Not on file  Occupational History  . Not on file  Social Needs  . Financial resource strain: Not on file  . Food insecurity:    Worry: Not on file    Inability: Not on file  . Transportation needs:    Medical: Not on file    Non-medical: Not on file  Tobacco Use  . Smoking status: Never Smoker  . Smokeless tobacco: Never Used  Substance and Sexual Activity  . Alcohol use: Yes    Alcohol/week: 0.0 oz    Comment: wine-social drinker  . Drug use: No  . Sexual activity: Yes    Partners: Male  Lifestyle  . Physical activity:    Days per week: Not on file    Minutes per session: Not on file  . Stress: Not on file  Relationships  . Social connections:    Talks on phone: Not on file    Gets together: Not on file    Attends religious service: Not on file    Active member of club or organization: Not on file    Attends meetings of clubs or organizations: Not on file    Relationship status: Not on file  . Intimate partner violence:    Fear of current or ex partner: Not on file    Emotionally abused: Not on file    Physically abused: Not on file    Forced sexual activity: Not on file  Other Topics Concern  . 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    She is a Designer, jewellery.      Current Outpatient Medications:  .  Cholecalciferol (VITAMIN D) 2000 units CAPS, Take 1 capsule (2,000 Units total) by mouth daily., Disp: 30 capsule, Rfl:  .  fluticasone (FLONASE) 50 MCG/ACT nasal spray, Place 2 sprays into both nostrils daily., Disp: 16 g, Rfl: 6 .  lisinopril (PRINIVIL,ZESTRIL) 5 MG tablet, TAKE 1 TABLET BY MOUTH ONCE DAILY, Disp: 90 tablet, Rfl: 1 .  loratadine (CLARITIN) 10 MG tablet, Take by mouth., Disp: ,  Rfl:  .  Thiamine HCl (VITAMIN B-1 PO), Take 500 mg by mouth daily., Disp: , Rfl:  .  vitamin B-12 (CYANOCOBALAMIN) 1000 MCG tablet, Take 1,000 mcg by mouth 3 (three) times daily., Disp: , Rfl:  .  scopolamine (TRANSDERM-SCOP, 1.5 MG,) 1 MG/3DAYS, Place 1 patch (1.5 mg total) onto the skin every 3 (three) days. Apply 4 hours prior to boarding ship, Disp: 10 patch, Rfl: 0  Allergies  Allergen Reactions  . Latex Rash  ROS  Constitutional: Negative for fever or significant  weight change since last visit .  Respiratory: Negative for cough and shortness of breath.   Cardiovascular: Negative for chest pain or palpitations.  Gastrointestinal: Negative for abdominal pain, no bowel changes.  Musculoskeletal: Negative for gait problem or joint swelling.  Skin: Negative for rash.  Neurological: Negative for dizziness or headache.  No other specific complaints in a complete review of systems (except as listed in HPI above).  Objective  Vitals:   10/22/17 0801  BP: 120/80  Pulse: 99  Resp: 14  SpO2: 99%  Weight: 190 lb 1.6 oz (86.2 kg)  Height: 5\' 1"  (1.549 m)    Body mass index is 35.92 kg/m.  Physical Exam  Constitutional: Patient appears well-developed and well-nourished. Obese  No distress.  HEENT: head atraumatic, normocephalic, pupils equal and reactive to light, neck supple, throat within normal limits Cardiovascular: Normal rate, regular rhythm and normal heart sounds.  No murmur heard. No BLE edema. Pulmonary/Chest: Effort normal and breath sounds normal. No respiratory distress. Abdominal: Soft.  There is no tenderness. Psychiatric: Patient has a normal mood and affect. behavior is normal. Judgment and thought content normal. Skin: area on gluteal fold is well healed, no pain, mild induration - pea size  Recent Results (from the past 2160 hour(s))  POCT urinalysis dipstick     Status: Abnormal   Collection Time: 08/23/17  9:33 AM  Result Value Ref Range   Color, UA  gold    Clarity, UA cloudy    Glucose, UA negative    Bilirubin, UA negative    Ketones, UA negative    Spec Grav, UA 1.025 1.010 - 1.025   Blood, UA trace    pH, UA 5.0 5.0 - 8.0   Protein, UA trace    Urobilinogen, UA negative (A) 0.2 or 1.0 E.U./dL   Nitrite, UA negative    Leukocytes, UA Trace (A) Negative   Appearance murky    Odor strong   Urine Culture     Status: None   Collection Time: 08/23/17 12:24 PM  Result Value Ref Range   MICRO NUMBER: [id number]    SPECIMEN QUALITY: ADEQUATE    Sample Source URINE    STATUS: FINAL    Result:      Single organism less than 10,000 CFU/mL isolated. These organisms, commonly found on external and internal genitalia, are considered colonizers. No further testing performed.  Urine Microscopic     Status: Abnormal   Collection Time: 08/23/17 12:24 PM  Result Value Ref Range   WBC, UA 6-10 (A) 0 - 5 /HPF   RBC / HPF 0-2 0 - 2 /HPF   Squamous Epithelial / LPF 6-10 (A) < OR = 5 /HPF   Bacteria, UA NONE SEEN NONE SEEN /HPF   Hyaline Cast NONE SEEN NONE SEEN /LPF   Urine-Other MANY MUCOUS THREADS   POCT HgB A1C     Status: Normal   Collection Time: 10/22/17  8:17 AM  Result Value Ref Range   Hemoglobin A1C 5.3       PHQ2/9: Depression screen [facility] 2/9 03/18/2017 06/17/2016 03/16/2016 09/17/2015 05/17/2015  Decreased Interest 0 0 0 0 0  Down, Depressed, Hopeless 0 0 0 0 0  PHQ - 2 Score 0 0 0 0 0     Fall Risk: Fall Risk  10/22/2017 10/22/2017 07/09/2017 03/18/2017 06/17/2016  Falls in the past year? No No No No No     Functional Status Survey: Is  the patient deaf or have difficulty hearing?: No Does the patient have difficulty seeing, even when wearing glasses/contacts?: No Does the patient have difficulty concentrating, remembering, or making decisions?: No Does the patient have difficulty walking or climbing stairs?: No Does the patient have difficulty dressing or bathing?: No Does the patient have difficulty doing errands  alone such as visiting a doctor's office or shopping?: No    Assessment & Plan  1. Type 2 diabetes mellitus with microalbuminuria, without long-term current use of insulin (HCC)  - POCT HgB A1C  2. Morbid obesity ([place])  Discussed with the patient the risk posed by an increased BMI. Discussed importance of portion control, calorie counting and at least 150 minutes of physical activity weekly. Avoid sweet beverages and drink more water. Eat at least 6 servings of fruit and vegetables daily   3. History of partial thyroidectomy  - TSH  4. Vitamin B1 deficiency  - Vitamin B1  5. Benign hypertension  - CBC with Differential/Platelet - COMPLETE METABOLIC PANEL WITH GFR  6. History of sleeve gastrectomy   7. Vitamin D deficiency  - VITAMIN D 25 Hydroxy (Vit-D Deficiency, Fractures)  8. Dyslipidemia associated with type 2 diabetes mellitus (HCC)  - Lipid panel  9. B12 deficiency  - Vitamin B12  10. Hx of motion sickness  - scopolamine (TRANSDERM-SCOP, 1.5 MG,) 1 MG/3DAYS; Place 1 patch (1.5 mg total) onto the skin every 3 (three) days. Apply 4 hours prior to boarding ship  Dispense: 10 patch; Refill: 0

## 2017-10-22 NOTE — Telephone Encounter (Signed)
She was supposed to go back in 3-4 months, please notify patient that she needs to contact their office and re-schedule

## 2017-10-22 NOTE — Telephone Encounter (Signed)
Patient was notified to call back, she states they did call to schedule an appointment but due to her old insurance she was going to have to pay $2,200 and did not have it. She has a new insurance and will call to see what her out of pocket expense will be and schedule another colonoscopy.

## 2017-10-22 NOTE — Telephone Encounter (Signed)
Copied from [place]. Topic: Inquiry >> Oct 22, 2017 11:00 AM Corie Chiquito, [place] wrote:  Reason for CRM: Shaunna form [facility] called to let someone know that the records that the office is requesting are in the patients chart. Stated that they are under the same [facility] system as the office. If anyone has any further questions she can be reached at [phone]

## 2017-10-25 ENCOUNTER — Telehealth: Payer: Self-pay | Admitting: Family Medicine

## 2017-10-25 NOTE — Telephone Encounter (Signed)
Copied from [place] [id number]. Topic: Quick Communication - See Telephone Encounter >> Oct 25, 2017 10:54 AM Conception Chancy, NT wrote: CRM for notification. See Telephone encounter for: 10/25/17.  Patient states she contacted her pharmacy and they never received a rx for scopolamine (TRANSDERM-SCOP, 1.5 MG,) 1 MG/3DAYS [  please resend.   [facility] [address] [place]  [place] [address]  Phone: [phone] Fax: [phone]

## 2017-10-25 NOTE — Telephone Encounter (Signed)
Prescription was called in to [facility] on [address] on 10/22/2016. I called [facility] they said they have the prescription but they are unable to get the medication. I contacted patient she is going to call and check to see if it is available at other pharmacies and have the prescription transferred to another pharmacy.

## 2017-10-28 ENCOUNTER — Telehealth: Payer: Self-pay | Admitting: Family Medicine

## 2017-10-28 LAB — CBC WITH DIFFERENTIAL/PLATELET
Basophils Absolute: 32 cells/uL (ref 0–200)
Basophils Relative: 0.9 %
EOS PCT: 1.2 %
Eosinophils Absolute: 42 cells/uL (ref 15–500)
HCT: 41.9 % (ref 35.0–45.0)
Hemoglobin: 14.1 g/dL (ref 11.7–15.5)
LYMPHS ABS: 2461 {cells}/uL (ref 850–3900)
MCH: 29 pg (ref 27.0–33.0)
MCHC: 33.7 g/dL (ref 32.0–36.0)
MCV: 86 fL (ref 80.0–100.0)
MONOS PCT: 4.9 %
MPV: 10.5 fL (ref 7.5–12.5)
NEUTROS ABS: 795 {cells}/uL — AB (ref 1500–7800)
NEUTROS PCT: 22.7 %
PLATELETS: 331 10*3/uL (ref 140–400)
RBC: 4.87 10*6/uL (ref 3.80–5.10)
RDW: 12.5 % (ref 11.0–15.0)
Total Lymphocyte: 70.3 %
WBC mixed population: 172 cells/uL — ABNORMAL LOW (ref 200–950)
WBC: 3.5 10*3/uL — AB (ref 3.8–10.8)

## 2017-10-28 LAB — COMPLETE METABOLIC PANEL WITH GFR
AG Ratio: 1.7 (calc) (ref 1.0–2.5)
ALKALINE PHOSPHATASE (APISO): 57 U/L (ref 33–130)
ALT: 18 U/L (ref 6–29)
AST: 16 U/L (ref 10–35)
Albumin: 4.7 g/dL (ref 3.6–5.1)
BILIRUBIN TOTAL: 0.4 mg/dL (ref 0.2–1.2)
BUN: 12 mg/dL (ref 7–25)
CHLORIDE: 102 mmol/L (ref 98–110)
CO2: 30 mmol/L (ref 20–32)
CREATININE: 0.63 mg/dL (ref 0.50–1.05)
Calcium: 10.2 mg/dL (ref 8.6–10.4)
GFR, Est African American: 121 mL/min/{1.73_m2} (ref >=60)
GFR, Est Non African American: 105 mL/min/{1.73_m2} (ref >=60)
GLUCOSE: 82 mg/dL (ref 65–99)
Globulin: 2.7 g/dL (calc) (ref 1.9–3.7)
Potassium: 4.3 mmol/L (ref 3.5–5.3)
Sodium: 139 mmol/L (ref 135–146)
Total Protein: 7.4 g/dL (ref 6.1–8.1)

## 2017-10-28 LAB — LIPID PANEL
CHOL/HDL RATIO: 3.8 (calc) (ref <5.0)
Cholesterol: 205 mg/dL — ABNORMAL HIGH (ref <200)
HDL: 54 mg/dL (ref >50)
LDL Cholesterol (Calc): 132 mg/dL (calc) — ABNORMAL HIGH
NON-HDL CHOLESTEROL (CALC): 151 mg/dL — AB (ref <130)
Triglycerides: 90 mg/dL (ref <150)

## 2017-10-28 LAB — TSH: TSH: 2.7 m[IU]/L

## 2017-10-28 LAB — VITAMIN B1: VITAMIN B1 (THIAMINE): 787 nmol/L — AB (ref 8–30)

## 2017-10-28 NOTE — Telephone Encounter (Signed)
Copied from [place]. Topic: Quick Communication - See Telephone Encounter >> Oct 28, 2017 12:36 PM Cleaster Corin, NT wrote: CRM for notification. See Telephone encounter for: 10/28/17.  Pt. Calling to receive lab results by phone or sent to her my chart. (pt. Said she had already received her A1c but nothing else)

## 2017-10-29 NOTE — Telephone Encounter (Signed)
Patient called and she is requesting her lab results. I was unable to give her the results because they have not been released. Please release labs to patient MyChart that is how she would like to receive her labs.

## 2017-12-13 ENCOUNTER — Other Ambulatory Visit: Payer: Self-pay | Admitting: Family Medicine

## 2017-12-13 ENCOUNTER — Telehealth: Payer: Self-pay

## 2017-12-13 DIAGNOSIS — R809 Proteinuria, unspecified: Secondary | ICD-10-CM

## 2017-12-13 NOTE — Telephone Encounter (Signed)
I recommend a TSH and CBC, we can start low dose premarin if tests negative or clonidine at night Hormone replacement therapy can cause increase risk of breast cancer and heart disease, but prevents colon cancer and bone loss Clonidine is a bp medication that helps with hot flashes at night.  Please order labs and tell her our plan , please ask what medication she would prefer.  Thank you

## 2017-12-13 NOTE — Telephone Encounter (Signed)
Hot flashes have just started 3 weeks ago-they have came on with a ventgeance happening every 3 to 4 hours. She has had them in the past but states her symptoms are so bad she is unable to sleep and it is getting dangerous. She is sweating to the point that her clothes are damp then going to freezing to death moments. Also is experiencing night sweats, hot flashes, sleep disturbances, heart has been racing and fatigue.  Please advise.

## 2017-12-13 NOTE — Telephone Encounter (Signed)
Copied from [place] [id number]. Topic: Inquiry >> Dec 13, 2017  3:52 PM Pricilla Handler wrote: Reason for CRM: Patient called requesting to speak with Dr. Ancil Boozer or her assistant regarding issues she is having with hot flashes. Patient only wants to speak with Dr. Ancil Boozer or her assistant.       Thank You!!!

## 2017-12-14 NOTE — Telephone Encounter (Signed)
Spoke with patient regarding options and she states she is going to try to hold off until her appointment in July. She knows she does not want to try Premarin due to the side effects and could possibly try Clonidine, but first she would like to try otc. She wants to try Black Cohosh and Flaxseed otc, then if they don't work she will call back.

## 2018-02-22 ENCOUNTER — Ambulatory Visit: Payer: PRIVATE HEALTH INSURANCE | Admitting: Family Medicine

## 2018-04-07 ENCOUNTER — Encounter: Payer: Self-pay | Admitting: Family Medicine

## 2018-04-07 ENCOUNTER — Ambulatory Visit: Payer: PRIVATE HEALTH INSURANCE | Admitting: Family Medicine

## 2018-04-07 VITALS — BP 128/76 | HR 70 | Temp 98.4°F | Resp 16 | Ht 61.0 in | Wt 208.4 lb

## 2018-04-07 DIAGNOSIS — R232 Flushing: Secondary | ICD-10-CM

## 2018-04-07 DIAGNOSIS — E538 Deficiency of other specified B group vitamins: Secondary | ICD-10-CM | POA: Diagnosis not present

## 2018-04-07 DIAGNOSIS — E1129 Type 2 diabetes mellitus with other diabetic kidney complication: Secondary | ICD-10-CM

## 2018-04-07 DIAGNOSIS — E559 Vitamin D deficiency, unspecified: Secondary | ICD-10-CM

## 2018-04-07 DIAGNOSIS — I10 Essential (primary) hypertension: Secondary | ICD-10-CM

## 2018-04-07 DIAGNOSIS — Z9889 Other specified postprocedural states: Secondary | ICD-10-CM

## 2018-04-07 DIAGNOSIS — Z9009 Acquired absence of other part of head and neck: Secondary | ICD-10-CM

## 2018-04-07 DIAGNOSIS — R809 Proteinuria, unspecified: Secondary | ICD-10-CM | POA: Diagnosis not present

## 2018-04-07 DIAGNOSIS — R5383 Other fatigue: Secondary | ICD-10-CM

## 2018-04-07 DIAGNOSIS — G4720 Circadian rhythm sleep disorder, unspecified type: Secondary | ICD-10-CM

## 2018-04-07 DIAGNOSIS — R0683 Snoring: Secondary | ICD-10-CM

## 2018-04-07 DIAGNOSIS — D708 Other neutropenia: Secondary | ICD-10-CM

## 2018-04-07 DIAGNOSIS — Z1231 Encounter for screening mammogram for malignant neoplasm of breast: Secondary | ICD-10-CM

## 2018-04-07 DIAGNOSIS — Z1239 Encounter for other screening for malignant neoplasm of breast: Secondary | ICD-10-CM

## 2018-04-07 DIAGNOSIS — G4733 Obstructive sleep apnea (adult) (pediatric): Secondary | ICD-10-CM

## 2018-04-07 DIAGNOSIS — E890 Postprocedural hypothyroidism: Secondary | ICD-10-CM

## 2018-04-07 LAB — POCT GLYCOSYLATED HEMOGLOBIN (HGB A1C): Hemoglobin A1C: 5.4 % (ref 4.0–5.6)

## 2018-04-07 MED ORDER — LISINOPRIL 5 MG PO TABS: 5.0000 mg | ORAL_TABLET | Freq: Every day | ORAL | 1 refills | Status: DC

## 2018-04-07 NOTE — Progress Notes (Signed)
Name: Debra Contreras   MRN: [id number]    DOB: [birth year]   Date:04/07/2018       Progress Note  Subjective  Chief Complaint  Chief Complaint  Patient presents with  . Thyroid Nodule  . Diabetes  . Obesity  . Hypertension  . Gastroesophageal Reflux  . Sleep Apnea  . Hyperlipidemia  . Immunizations    patient gets her flu shot at work    HPI    Thyroid nodule: she was sitting on her desk working on a report and felt a large nodule on the left side back in October 2016, she had a partial thyroidectomy in March 2017, and is doing well since. Last TSH was at goal; no skin/hair/nail changes, no fatigue, no heart palpitations, however having severe hot flashes now that are keeping her up at night.    DMII with renal manifestation and dyslipidemia: she is doing well, no longer taking Metforminbut still takes Lisinopril 5mg  for proteinuria.,last urine micro was 50, recheck today. She is not checking glucose at home. Eye exam is up to date. She denies polyphagia, polydipsia or polyuria. Foot exam today   Obesity:  Starting weight on April 9th, 2016 was 274.4 lbs. Pt has Gastric Sleeve placement 09/16/16 in [place], and had lost 84 lbs. She has gained 18 lbs in the past 6 months. She thinks secondary to vacation but also increase in stress from switching jobs.  Marland Kitchen HTN: bp has been at goal, taking lisinopril now mostly for kidney protection, no chest pain or palpitation.   Hot flashes: she has a hysterectomy without BSO back in 2013 and over the past 5 months she has noticed hot flashes during the day but worse at night, she has about 2 per hour . Tried flaxseed oil and states improved a little her symptoms, has black cohosh at home but has not taken it yet.   OSA: not using CPAP machine because she can't afford it. Symptoms improved with  weight loss and thyroidectomy, however getting worse again, wakes up hearing herself snore, feeling tired during the day, not sleeping well during the  night, she has headaches in am's.    Patient Active Problem List   Diagnosis Date Noted  . 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 History of partial thyroidectomy 10/05/2015  . H/O thyroid nodule 10/03/2015  . Morbid obesity ([place]) 06/04/2015  . Breast lump in female 05/17/2015  . Acanthosis nigricans 03/09/2015  . Allergic rhinitis, seasonal 03/09/2015  . Diabetes mellitus with renal manifestation ([place]) 03/09/2015  . Dyslipidemia 03/09/2015  . Gastro-esophageal reflux disease without esophagitis 03/09/2015  . Hiatal hernia 03/09/2015  . History of anemia 03/09/2015  . H/O: hysterectomy 03/09/2015  . Uterus, adenomyosis 03/09/2015  . Calculus of kidney 03/09/2015  . Benign hypertension 03/09/2015  . Extreme obesity 03/09/2015  . Vitamin D deficiency 03/09/2015  . Apnea, sleep 09/12/2014  . Microalbuminuria 05/29/2014    Past Surgical History:  Procedure Laterality Date  . ABDOMINAL HYSTERECTOMY  03/2012  . BREAST BIOPSY Right 12/2005   Normal-Cyst  . BREAST SURGERY Bilateral 1990   Reduction  . COLONOSCOPY WITH PROPOFOL N/A 05/07/2017   Procedure: COLONOSCOPY WITH PROPOFOL;  Surgeon: Jonathon Bellows, MD;  [facility];  Service: Gastroenterology;  Laterality: N/A;  . OOPHORECTOMY Left 03/2011  . REDUCTION MAMMAPLASTY    . SLEEVE GASTROPLASTY  09/2016   [place]  . thyroidectomy Left 10/08/2015    Family History  Problem Relation Age of Onset  . Hypertension Mother   .  Cirrhosis Mother   . COPD Mother        Heavy Smoker  . Hepatitis C Mother   . Alcohol abuse Father   . Heart disease Father   . Heart attack Father   . Diabetes Brother   . Obesity Brother   . Lung cancer Maternal Grandmother        Snuff  . Diabetes Maternal Grandmother   . Alcoholism Maternal Grandmother   . Prostate cancer Paternal Grandfather   . Alcoholism Maternal Grandfather   . Diabetes Maternal Grandfather     Social History   Socioeconomic History  . Marital status:  Divorced    Spouse name: Not on file  . Number of children: Not on file  . Years of education: Not on file  . Highest education level: Not on file  Occupational History  . Occupation: Designer, jewellery   Social Needs  . Financial resource strain: Not hard at all  . Food insecurity:    Worry: Never true    Inability: Never true  . Transportation needs:    Medical: No    Non-medical: No  Tobacco Use  . Smoking status: Never Smoker  . Smokeless tobacco: Never Used  Substance and Sexual Activity  . Alcohol use: Yes    Alcohol/week: 0.0 standard drinks    Comment: wine-social drinker  . Drug use: No  . Sexual activity: Yes    Partners: Male  Lifestyle  . Physical activity:    Days per week: 0 days    Minutes per session: 0 min  . Stress: Not at all  Relationships  . Social connections:    Talks on phone: More than three times a week    Gets together: More than three times a week    Attends religious service: More than 4 times per year    Active member of club or organization: Yes    Attends meetings of clubs or organizations: Never    Relationship status: Married  . Intimate partner violence:    Fear of current or ex partner: No    Emotionally abused: No    Physically abused: No    Forced sexual activity: No  Other Topics Concern  . Not on file  Social History Narrative   Lives alone   She is a Designer, jewellery.      Current Outpatient Medications:  .  Cholecalciferol (VITAMIN D) 2000 units CAPS, Take 1 capsule (2,000 Units total) by mouth daily., Disp: 30 capsule, Rfl:  .  fluticasone (FLONASE) 50 MCG/ACT nasal spray, Place 2 sprays into both nostrils daily., Disp: 16 g, Rfl: 6 .  lisinopril (PRINIVIL,ZESTRIL) 5 MG tablet, TAKE 1 TABLET BY MOUTH ONCE DAILY, Disp: 90 tablet, Rfl: 0 .  loratadine (CLARITIN) 10 MG tablet, Take by mouth., Disp: , Rfl:  .  Thiamine HCl (VITAMIN B-1 PO), Take 500 mg by mouth daily., Disp: , Rfl:  .  vitamin B-12 (CYANOCOBALAMIN) 1000 MCG  tablet, Take 1,000 mcg by mouth 3 (three) times daily., Disp: , Rfl:   Allergies  Allergen Reactions  . Latex Rash     ROS  Constitutional: Negative for fever, positive for  weight change.  Respiratory: Negative for cough ( almost resolved - had a recent cold)  and shortness of breath.   Cardiovascular: Negative for chest pain or palpitations.  Gastrointestinal: Negative for abdominal pain, no bowel changes.  Musculoskeletal: Negative for gait problem or joint swelling.  Skin: Negative for rash.  Neurological: Negative for  dizziness , positive for intermittent  headache.  No other specific complaints in a complete review of systems (except as listed in HPI above).  Objective  Vitals:   04/07/18 0752  BP: 128/76  Pulse: 70  Resp: 16  Temp: 98.4 F (36.9 C)  TempSrc: Oral  SpO2: 98%  Weight: 208 lb 6.4 oz (94.5 kg)  Height: 5\' 1"  (1.549 m)    Body mass index is 39.38 kg/m.  Physical Exam  Constitutional: Patient appears well-developed and well-nourished. Obese  No distress.  HEENT: head atraumatic, normocephalic, pupils equal and reactive to light,neck supple, throat within normal limits Cardiovascular: Normal rate, regular rhythm and normal heart sounds.  No murmur heard. No BLE edema. Pulmonary/Chest: Effort normal and breath sounds normal. No respiratory distress. Abdominal: Soft.  There is no tenderness. Psychiatric: Patient has a normal mood and affect. behavior is normal. Judgment and thought content normal.  Recent Results (from the past 2160 hour(s))  POCT glycosylated hemoglobin (Hb A1C)     Status: Normal   Collection Time: 04/07/18  8:08 AM  Result Value Ref Range   Hemoglobin A1C 5.4 4.0 - 5.6 %   HbA1c POC (<> result, manual entry)     HbA1c, POC (prediabetic range)     HbA1c, POC (controlled diabetic range)      Diabetic Foot Exam: Diabetic Foot Exam - Simple   Simple Foot Form Diabetic Foot exam was performed with the following findings:  Yes  04/07/2018  8:14 AM  Visual Inspection No deformities, no ulcerations, no other skin breakdown bilaterally:  Yes Sensation Testing Intact to touch and monofilament testing bilaterally:  Yes Pulse Check Posterior Tibialis and Dorsalis pulse intact bilaterally:  Yes Comments      PHQ2/9: Depression screen [facility] 2/9 04/07/2018 03/18/2017 06/17/2016 03/16/2016 09/17/2015  Decreased Interest 0 0 0 0 0  Down, Depressed, Hopeless 0 0 0 0 0  PHQ - 2 Score 0 0 0 0 0  Altered sleeping 0 - - - -  Tired, decreased energy 0 - - - -  Change in appetite 0 - - - -  Feeling bad or failure about yourself  0 - - - -  Trouble concentrating 0 - - - -  Moving slowly or fidgety/restless 0 - - - -  Suicidal thoughts 0 - - - -  PHQ-9 Score 0 - - - -  Difficult doing work/chores Not difficult at all - - - -     Fall Risk: Fall Risk  04/07/2018 10/22/2017 10/22/2017 07/09/2017 03/18/2017  Falls in the past year? No No No No No    Functional Status Survey: Is the patient deaf or have difficulty hearing?: No Does the patient have difficulty seeing, even when wearing glasses/contacts?: No Does the patient have difficulty concentrating, remembering, or making decisions?: No Does the patient have difficulty walking or climbing stairs?: No Does the patient have difficulty dressing or bathing?: No Does the patient have difficulty doing errands alone such as visiting a doctor's office or shopping?: No    Assessment & Plan  1. Type 2 diabetes mellitus with microalbuminuria, without long-term current use of insulin (HCC)  - POCT glycosylated hemoglobin (Hb A1C) - Urine Microalbumin w/creat. ratio  2. History of partial thyroidectomy  - TSH  3. Morbid obesity ([place])  - Ambulatory referral to Sleep Studies  4. B12 deficiency  - Vitamin B12  5. Vitamin D deficiency  - VITAMIN D 25 Hydroxy (Vit-D Deficiency, Fractures)  6. Other neutropenia (HCC)  -  CBC with Differential/Platelet  7. Hot flashes  -  FSH/LH  8. Benign hypertension  - COMPLETE METABOLIC PANEL WITH GFR - Ambulatory referral to Sleep Studies  9. Snoring  - Ambulatory referral to Sleep Studies  10. Other fatigue   11. Sleep pattern disturbance  - Ambulatory referral to Sleep Studies  12. Breast cancer screening  - MM 3D SCREEN BREAST BILATERAL; Future

## 2018-04-08 LAB — CBC WITH DIFFERENTIAL/PLATELET
BASOS ABS: 20 {cells}/uL (ref 0–200)
Basophils Relative: 0.5 %
EOS ABS: 92 {cells}/uL (ref 15–500)
Eosinophils Relative: 2.3 %
HCT: 43.2 % (ref 35.0–45.0)
Hemoglobin: 14.5 g/dL (ref 11.7–15.5)
Lymphs Abs: 2564 cells/uL (ref 850–3900)
MCH: 29.1 pg (ref 27.0–33.0)
MCHC: 33.6 g/dL (ref 32.0–36.0)
MCV: 86.6 fL (ref 80.0–100.0)
MPV: 10.7 fL (ref 7.5–12.5)
Monocytes Relative: 7.1 %
NEUTROS PCT: 26 %
Neutro Abs: 1040 cells/uL — ABNORMAL LOW (ref 1500–7800)
PLATELETS: 298 10*3/uL (ref 140–400)
RBC: 4.99 10*6/uL (ref 3.80–5.10)
RDW: 12.7 % (ref 11.0–15.0)
TOTAL LYMPHOCYTE: 64.1 %
WBC mixed population: 284 cells/uL (ref 200–950)
WBC: 4 10*3/uL (ref 3.8–10.8)

## 2018-04-08 LAB — MICROALBUMIN / CREATININE URINE RATIO
CREATININE, URINE: 162 mg/dL (ref 20–275)
MICROALB UR: 0.7 mg/dL
MICROALB/CREAT RATIO: 4 ug/mg{creat} (ref <30)

## 2018-04-08 LAB — COMPLETE METABOLIC PANEL WITH GFR
AG Ratio: 1.6 (calc) (ref 1.0–2.5)
ALKALINE PHOSPHATASE (APISO): 62 U/L (ref 33–130)
ALT: 25 U/L (ref 6–29)
AST: 21 U/L (ref 10–35)
Albumin: 4.9 g/dL (ref 3.6–5.1)
BUN: 12 mg/dL (ref 7–25)
CALCIUM: 10.3 mg/dL (ref 8.6–10.4)
CO2: 29 mmol/L (ref 20–32)
CREATININE: 0.69 mg/dL (ref 0.50–1.05)
Chloride: 100 mmol/L (ref 98–110)
GFR, EST NON AFRICAN AMERICAN: 101 mL/min/{1.73_m2} (ref >=60)
GFR, Est African American: 117 mL/min/{1.73_m2} (ref >=60)
GLUCOSE: 78 mg/dL (ref 65–99)
Globulin: 3 g/dL (calc) (ref 1.9–3.7)
Potassium: 4.1 mmol/L (ref 3.5–5.3)
Sodium: 139 mmol/L (ref 135–146)
Total Bilirubin: 0.3 mg/dL (ref 0.2–1.2)
Total Protein: 7.9 g/dL (ref 6.1–8.1)

## 2018-04-08 LAB — TSH: TSH: 3.76 m[IU]/L

## 2018-04-08 LAB — VITAMIN D 25 HYDROXY (VIT D DEFICIENCY, FRACTURES): VIT D 25 HYDROXY: 36 ng/mL (ref 30–100)

## 2018-04-08 LAB — VITAMIN B12: Vitamin B-12: >2000 pg/mL — ABNORMAL HIGH (ref 200–1100)

## 2018-04-08 LAB — FSH/LH
FSH: 38.8 m[IU]/mL
LH: 25.9 m[IU]/mL

## 2018-04-25 ENCOUNTER — Encounter: Payer: Self-pay | Admitting: Family Medicine

## 2018-04-25 ENCOUNTER — Ambulatory Visit: Payer: No Typology Code available for payment source | Admitting: Family Medicine

## 2018-04-25 ENCOUNTER — Emergency Department
Admission: EM | Admit: 2018-04-25 | Discharge: 2018-04-25 | Disposition: A | Discharge status: Home / Self Care | Payer: No Typology Code available for payment source | Attending: Emergency Medicine | Admitting: Emergency Medicine

## 2018-04-25 ENCOUNTER — Encounter: Payer: Self-pay | Admitting: Emergency Medicine

## 2018-04-25 ENCOUNTER — Other Ambulatory Visit: Payer: Self-pay

## 2018-04-25 VITALS — BP 132/80 | HR 78 | Temp 98.7°F | Resp 16 | Ht 61.0 in | Wt 211.5 lb

## 2018-04-25 DIAGNOSIS — H04012 Acute dacryoadenitis, left lacrimal gland: Secondary | ICD-10-CM

## 2018-04-25 DIAGNOSIS — R809 Proteinuria, unspecified: Secondary | ICD-10-CM | POA: Diagnosis not present

## 2018-04-25 DIAGNOSIS — E1129 Type 2 diabetes mellitus with other diabetic kidney complication: Secondary | ICD-10-CM | POA: Diagnosis not present

## 2018-04-25 DIAGNOSIS — Z79899 Other long term (current) drug therapy: Secondary | ICD-10-CM | POA: Diagnosis not present

## 2018-04-25 DIAGNOSIS — L03211 Cellulitis of face: Secondary | ICD-10-CM

## 2018-04-25 DIAGNOSIS — E119 Type 2 diabetes mellitus without complications: Secondary | ICD-10-CM | POA: Diagnosis not present

## 2018-04-25 DIAGNOSIS — I10 Essential (primary) hypertension: Secondary | ICD-10-CM | POA: Insufficient documentation

## 2018-04-25 DIAGNOSIS — R600 Localized edema: Secondary | ICD-10-CM | POA: Diagnosis present

## 2018-04-25 LAB — CBC WITH DIFFERENTIAL/PLATELET
Basophils Absolute: 0 10*3/uL (ref 0–0.1)
Basophils Relative: 1 %
Eosinophils Absolute: 0.1 10*3/uL (ref 0–0.7)
Eosinophils Relative: 2 %
HEMATOCRIT: 39.1 % (ref 35.0–47.0)
Hemoglobin: 13.7 g/dL (ref 12.0–16.0)
LYMPHS PCT: 44 %
Lymphs Abs: 2.8 10*3/uL (ref 1.0–3.6)
MCH: 30.6 pg (ref 26.0–34.0)
MCHC: 35 g/dL (ref 32.0–36.0)
MCV: 87.3 fL (ref 80.0–100.0)
MONOS PCT: 8 %
Monocytes Absolute: 0.5 10*3/uL (ref 0.2–0.9)
NEUTROS ABS: 2.9 10*3/uL (ref 1.4–6.5)
Neutrophils Relative %: 45 %
Platelets: 292 10*3/uL (ref 150–440)
RBC: 4.48 MIL/uL (ref 3.80–5.20)
RDW: 13.3 % (ref 11.5–14.5)
WBC: 6.3 10*3/uL (ref 3.6–11.0)

## 2018-04-25 LAB — BASIC METABOLIC PANEL
Anion gap: 8 (ref 5–15)
BUN: 13 mg/dL (ref 6–20)
CALCIUM: 9.3 mg/dL (ref 8.9–10.3)
CO2: 27 mmol/L (ref 22–32)
CREATININE: 0.78 mg/dL (ref 0.44–1.00)
Chloride: 103 mmol/L (ref 98–111)
GFR calc Af Amer: >60 mL/min (ref >60)
GFR calc non Af Amer: >60 mL/min (ref >60)
GLUCOSE: 95 mg/dL (ref 70–99)
Potassium: 3.8 mmol/L (ref 3.5–5.1)
Sodium: 138 mmol/L (ref 135–145)

## 2018-04-25 MED ORDER — CLINDAMYCIN PHOSPHATE 600 MG/50ML IV SOLN
600.0000 mg | Freq: Once | INTRAVENOUS | Status: AC
  Administered 2018-04-25: 600 mg via INTRAVENOUS
  Filled 2018-04-25: qty 50

## 2018-04-25 MED ORDER — CLINDAMYCIN HCL 150 MG PO CAPS: 300.0000 mg | ORAL_CAPSULE | Freq: Three times a day (TID) | ORAL | 0 refills | Status: DC

## 2018-04-25 MED ORDER — TOBRAMYCIN 0.3 % OP SOLN: 2.0000 [drp] | OPHTHALMIC | 0 refills | Status: DC

## 2018-04-25 MED ORDER — TRAMADOL HCL 50 MG PO TABS: 50.0000 mg | ORAL_TABLET | Freq: Four times a day (QID) | ORAL | 0 refills | Status: DC | PRN

## 2018-04-25 NOTE — ED Triage Notes (Signed)
Pt here with c/o right sided nose swelling and pain that began last Wed, and left eye swelling that began last pm, states she felt the pain and swelling started with a pimple last week or so she thought, went to her Dr today and the concern is possible cellulitis. No breathing issues noted, pt talking in complete sentences. Appears in NAD.

## 2018-04-25 NOTE — Discharge Instructions (Addendum)
Follow-up with your regular doctor if not improving in 3 days.  If you are worsening please return the emergency department.  Follow-up with [facility] if the tear duct at the medial aspect becomes more red and swollen.  This is causing the swelling of the lower lid.  Apply a warm compress at least once every 30 minutes to help decrease the swelling in the left eye.  You have  been given a prescription for clindamycin and tobramycin ophthalmic drops.  Please use these as instructed.

## 2018-04-25 NOTE — ED Provider Notes (Signed)
[facility] Emergency Department Provider Note  ____________________________________________   First MD Initiated Contact with Patient 04/25/18 1536     (approximate)  I have reviewed the triage vital signs and the nursing notes.   HISTORY  Chief Complaint Facial Swelling    HPI Debra Contreras is a 52 y.o. female presents emergency department complaining of a red nose.  She states she saw her regular doctors and she was concerned about cellulitis of the tip of the nose.  She states she also has a swollen lower lid along the left eye.  No tearing or matting.  She states the swelling has got worse throughout the day.  She denies any fever or chills.  She denies chest pain or shortness of breath.    Past Medical History:  Diagnosis Date  . Allergy   . Diabetes mellitus without complication ([place])   . GERD (gastroesophageal reflux disease)   . Hiatal hernia   . History of PCOS   . Hyperlipidemia   . Hypertension   . Kidney stones   . Microalbuminuria    DM  . Sleep apnea   . Snoring   . Vitamin D deficiency     Patient Active Problem List   Diagnosis Date Noted  . 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 History of partial thyroidectomy 10/05/2015  . H/O thyroid nodule 10/03/2015  . Morbid obesity ([place]) 06/04/2015  . Breast lump in female 05/17/2015  . Acanthosis nigricans 03/09/2015  . Allergic rhinitis, seasonal 03/09/2015  . Diabetes mellitus with renal manifestation ([place]) 03/09/2015  . Dyslipidemia 03/09/2015  . Gastro-esophageal reflux disease without esophagitis 03/09/2015  . Hiatal hernia 03/09/2015  . History of anemia 03/09/2015  . H/O: hysterectomy 03/09/2015  . Uterus, adenomyosis 03/09/2015  . Calculus of kidney 03/09/2015  . Benign hypertension 03/09/2015  . Extreme obesity 03/09/2015  . Vitamin D deficiency 03/09/2015  . Apnea, sleep 09/12/2014  . Microalbuminuria 05/29/2014    Past Surgical History:  Procedure  Laterality Date  . ABDOMINAL HYSTERECTOMY  03/2012  . BREAST BIOPSY Right 12/2005   Normal-Cyst  . BREAST SURGERY Bilateral 1990   Reduction  . COLONOSCOPY WITH PROPOFOL N/A 05/07/2017   Procedure: COLONOSCOPY WITH PROPOFOL;  Surgeon: Jonathon Bellows, MD;  Location: [facility] ENDOSCOPY;  Service: Gastroenterology;  Laterality: N/A;  . OOPHORECTOMY Left 03/2011  . REDUCTION MAMMAPLASTY    . SLEEVE GASTROPLASTY  09/2016   [place]  . thyroidectomy Left 10/08/2015    Prior to Admission medications   Medication Sig Start Date End Date Taking? Authorizing Provider  Cholecalciferol (VITAMIN D) 2000 units CAPS Take 1 capsule (2,000 Units total) by mouth daily. 12/08/16   Hubbard Hartshorn, FNP  clindamycin (CLEOCIN) 150 MG capsule Take 2 capsules (300 mg total) by mouth 3 (three) times daily. 04/25/18   Fisher, Linden Dolin, PA-C  fluticasone (FLONASE) 50 MCG/ACT nasal spray Place 2 sprays into both nostrils daily. 08/23/17   Hubbard Hartshorn, FNP  lisinopril (PRINIVIL,ZESTRIL) 5 MG tablet Take 1 tablet (5 mg total) by mouth daily. 04/07/18   Steele Sizer, MD  loratadine (CLARITIN) 10 MG tablet Take by mouth.    [provider]  Thiamine HCl (VITAMIN B-1 PO) Take 500 mg by mouth daily.    [provider]  tobramycin (TOBREX) 0.3 % ophthalmic solution Place 2 drops into the left eye every 4 (four) hours. 04/25/18   Fisher, Linden Dolin, PA-C  traMADol (ULTRAM) 50 MG tablet Take 1 tablet (50 mg total)  by mouth every 6 (six) hours as needed. 04/25/18   Fisher, Linden Dolin, PA-C  vitamin B-12 (CYANOCOBALAMIN) 1000 MCG tablet Take 1,000 mcg by mouth 3 (three) times daily.    [provider]    Allergies Latex  Family History  Problem Relation Age of Onset  . Hypertension Mother   . Cirrhosis Mother   . COPD Mother        Heavy Smoker  . Hepatitis C Mother   . Alcohol abuse Father   . Heart disease Father   . Heart attack Father   . Diabetes Brother   . Obesity Brother   . Lung cancer Maternal  Grandmother        Snuff  . Diabetes Maternal Grandmother   . Alcoholism Maternal Grandmother   . Prostate cancer Paternal Grandfather   . Alcoholism Maternal Grandfather   . Diabetes Maternal Grandfather     Social History Social History   Tobacco Use  . Smoking status: Never Smoker  . Smokeless tobacco: Never Used  Substance Use Topics  . Alcohol use: Yes    Alcohol/week: 0.0 standard drinks    Comment: wine-social drinker  . Drug use: No    Review of Systems  Constitutional: No fever/chills Eyes: No visual changes.  Positive for redness and swelling of the left lower eyelid ENT: No sore throat.  Positive for swelling and redness to the tip of the nose Respiratory: Denies cough Genitourinary: Negative for dysuria. Musculoskeletal: Negative for back pain. Skin: Negative for rash.    ____________________________________________   PHYSICAL EXAM:  VITAL SIGNS: ED Triage Vitals  Enc Vitals Group     BP 04/25/18 1525 106/71     Pulse Rate 04/25/18 1525 98     Resp 04/25/18 1525 20     Temp 04/25/18 1525 99 F (37.2 C)     Temp Source 04/25/18 1525 Oral     SpO2 04/25/18 1525 98 %     Weight 04/25/18 1525 211 lb (95.7 kg)     Height 04/25/18 1525 '5\' 1"'  (1.549 m)     Head Circumference --      Peak Flow --      Pain Score 04/25/18 1531 8     Pain Loc --      Pain Edu? --      Excl. in [place]? --     Constitutional: Alert and oriented. Well appearing and in no acute distress. Eyes: Conjunctivae are normal.  The left lower lid is swollen at the medial aspect along to the lateral.  The redness and irritation is noted at the medial aspect along the lacrimal tube.  No pus or drainage is noted from the duct. Head: Atraumatic. Nose: No congestion/rhinnorhea.  Tip of the nose is red hard swollen and tender, no pustules noted Mouth/Throat: Mucous membranes are moist.   Neck:  supple no lymphadenopathy noted Cardiovascular: Normal rate, regular rhythm. Heart sounds are  normal Respiratory: Normal respiratory effort.  No retractions, lungs c t a  GU: deferred Musculoskeletal: FROM all extremities, warm and well perfused Neurologic:  Normal speech and language.  Skin:  Skin is warm, dry and intact.  Redness of the nose and left lower lid  psychiatric: Mood and affect are normal. Speech and behavior are normal.  ____________________________________________   LABS (all labs ordered are listed, but only abnormal results are displayed)  Labs Reviewed  CBC WITH DIFFERENTIAL/PLATELET  BASIC METABOLIC PANEL   ____________________________________________   ____________________________________________  RADIOLOGY  ____________________________________________   PROCEDURES  Procedure(s) performed: Clindamycin 600 mg IV  Procedures    ____________________________________________   INITIAL IMPRESSION / ASSESSMENT AND PLAN / ED COURSE  Pertinent labs & imaging results that were available during my care of the patient were reviewed by me and considered in my medical decision making (see chart for details).   Patient is a 52 year old female presents emergency department after being seen by her regular physician.  Her physician wanted her to be seen at [facility] ENT but they do not take her insurance.  So she came here.  She states that the physician was concerned about cellulitis at the tip of her nose.  She denies any fever or chills.  On physical exam the tip of the nose is bright red swollen and tender.  No pustule is noted.  The left eye has a red swollen tear duct at the lacrimal aspect and there is swelling and stretching over to the lateral aspect.  No pus or drainage is noted.  Remainder the exam is unremarkable  CBC is normal and met B is normal  Discussed the findings with the patient.  Told her I agree with the diagnosis of cellulitis of the nose.  Also think that she is got infection along the lacrimal duct.  She was given clindamycin 600  mg IV .  She is to follow-up with [facility] if the left eye lid is worsening.  She is to return to the emergency department if the cellulitis on the nose is not improving in 3 to 5 days.  If she is worsening she is to return earlier.  She may also see her regular doctor.  She was given a prescription for clindamycin 150 mg 2 pills 3 times daily and tobramycin ophthalmic drops.  She is to apply a warm compress to the left eye for a 5 to 10 minutes every 30 to 45 minutes.  She states she understands and will comply with our instructions.  She was discharged in stable condition.     As part of my medical decision making, I reviewed the following data within the [place] notes reviewed and incorporated, Labs reviewed CBC was normal WBC, met b normal, Notes from prior ED visits and [place] Controlled Substance Database  ____________________________________________   FINAL CLINICAL IMPRESSION(S) / ED DIAGNOSES  Final diagnoses:  Facial cellulitis  Acute dacryadenitis of left lacrimal gland      NEW MEDICATIONS STARTED DURING THIS VISIT:  Discharge Medication List as of 04/25/2018  4:55 PM    START taking these medications   Details  clindamycin (CLEOCIN) 150 MG capsule Take 2 capsules (300 mg total) by mouth 3 (three) times daily., Starting Mon 04/25/2018, Normal    tobramycin (TOBREX) 0.3 % ophthalmic solution Place 2 drops into the left eye every 4 (four) hours., Starting Mon 04/25/2018, Normal    traMADol (ULTRAM) 50 MG tablet Take 1 tablet (50 mg total) by mouth every 6 (six) hours as needed., Starting Mon 04/25/2018, Normal         Note:  This document was prepared using Dragon voice recognition software and may include unintentional dictation errors.    Versie Starks, PA-C 04/25/18 1753    Eula Listen, MD 04/25/18 2230

## 2018-04-25 NOTE — Progress Notes (Signed)
Name: Debra Contreras   MRN: [id number]    DOB: Dec 29, 1965   Date:04/25/2018       Progress Note  Subjective  Chief Complaint  Chief Complaint  Patient presents with  . Facial Swelling    left eye and nose swollen, red and painful for 6 days    HPI  PT presents with concern for facial swelling and redness.  She felt like she may have a pimple in the right nostril 5 days ago, this has since progressed to swelling and redness to the nose - worse on the right side, and LEFT lower eyelid swelling and redness.  Both areas are tender; endorses photophobia bilaterally, some blurred vision in the LEFT eye.  Stopped using flonase x3 days. Pain is 8/10.  She does have history of DM- however over the last year, A1C's have been in the 5.2-5.6% range.  Patient Active Problem List   Diagnosis Date Noted  . 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 History of partial thyroidectomy 10/05/2015  . H/O thyroid nodule 10/03/2015  . Morbid obesity ([place]) 06/04/2015  . Breast lump in female 05/17/2015  . Acanthosis nigricans 03/09/2015  . Allergic rhinitis, seasonal 03/09/2015  . Diabetes mellitus with renal manifestation ([place]) 03/09/2015  . Dyslipidemia 03/09/2015  . Gastro-esophageal reflux disease without esophagitis 03/09/2015  . Hiatal hernia 03/09/2015  . History of anemia 03/09/2015  . H/O: hysterectomy 03/09/2015  . Uterus, adenomyosis 03/09/2015  . Calculus of kidney 03/09/2015  . Benign hypertension 03/09/2015  . Extreme obesity 03/09/2015  . Vitamin D deficiency 03/09/2015  . Apnea, sleep 09/12/2014  . Microalbuminuria 05/29/2014    Social History   Tobacco Use  . Smoking status: Never Smoker  . Smokeless tobacco: Never Used  Substance Use Topics  . Alcohol use: Yes    Alcohol/week: 0.0 standard drinks    Comment: wine-social drinker     Current Outpatient Medications:  .  Cholecalciferol (VITAMIN D) 2000 units CAPS, Take 1 capsule (2,000 Units total) by mouth daily.,  Disp: 30 capsule, Rfl:  .  fluticasone (FLONASE) 50 MCG/ACT nasal spray, Place 2 sprays into both nostrils daily., Disp: 16 g, Rfl: 6 .  lisinopril (PRINIVIL,ZESTRIL) 5 MG tablet, Take 1 tablet (5 mg total) by mouth daily., Disp: 90 tablet, Rfl: 1 .  loratadine (CLARITIN) 10 MG tablet, Take by mouth., Disp: , Rfl:  .  Thiamine HCl (VITAMIN B-1 PO), Take 500 mg by mouth daily., Disp: , Rfl:  .  vitamin B-12 (CYANOCOBALAMIN) 1000 MCG tablet, Take 1,000 mcg by mouth 3 (three) times daily., Disp: , Rfl:   Allergies  Allergen Reactions  . Latex Rash    I personally reviewed active problem list, medication list, allergies, lab results with the patient/caregiver today.  ROS  Ten systems reviewed and is negative except as mentioned in HPI.  Objective  Vitals:   04/25/18 1105  BP: 132/80  Pulse: 78  Resp: 16  Temp: 98.7 F (37.1 C)  TempSrc: Oral  SpO2: 99%  Weight: 211 lb 8 oz (95.9 kg)  Height: 5\' 1"  (1.549 m)    Body mass index is 39.96 kg/m.  Nursing Note and Vital Signs reviewed.  Physical Exam  Constitutional: Patient appears well-developed and well-nourished. No distress.  HENT: Head: Normocephalic and atraumatic. Nose - bridge and right side exhibit erythema and mild edema and exquisite tenderness. Mouth/Throat: Oropharynx is clear and moist. No oropharyngeal exudate or tonsillar swelling. Eyes: Conjunctivae mildly injected and EOM intact. No scleral icterus.  Pupils are equal, round, and reactive to light. LEFT lower eyelid is erythematous, with edema and mild tenderness. Neck: Normal range of motion. Neck supple. No JVD present. No thyromegaly present.  Cardiovascular: Normal rate, regular rhythm and normal heart sounds.  No murmur heard. No BLE edema. Pulmonary/Chest: Effort normal and breath sounds normal. No respiratory distress. Musculoskeletal: Normal range of motion, no joint effusions. No gross deformities Neurological: Pt is alert and oriented to person, place,  and time. No cranial nerve deficit. Coordination, balance, strength, speech and gait are normal.  Skin: Skin is warm and dry. No rash noted. No erythema.  Psychiatric: Patient has a normal mood and affect. behavior is normal. Judgment and thought content normal.  No results found for this or any previous visit (from the past 72 hour(s)).  Assessment & Plan  1. Cellulitis of face - Ambulatory referral to ENT - STAT - scheduled with [facility] at 2:45pm this afternoon. - Discussed case with PCP Dr. Ancil Boozer who is in agreement.  2. Type 2 diabetes mellitus with microalbuminuria, without long-term current use of insulin (HCC) - Well controlled; discussed that this can contribute to complex infections.  -Red flags and when to present for emergency care or RTC including fever >101.55F, chest pain, shortness of breath, new/worsening/un-resolving symptoms, inability to move eyes/pain with eye movement, worsening of swelling or pain, worsening vision changes reviewed with patient at time of visit. Follow up and care instructions discussed and provided in AVS.

## 2018-04-25 NOTE — ED Notes (Signed)
See triage note  Presents with swelling under left eye and left side of nose  States sxs' started last weds    States area started as a pimple   And now pain and swelling has increased

## 2018-07-08 ENCOUNTER — Ambulatory Visit
Admission: RE | Admit: 2018-07-08 | Discharge: 2018-07-08 | Disposition: A | Discharge status: Home / Self Care | Payer: PRIVATE HEALTH INSURANCE | Source: Ambulatory Visit | Attending: Family Medicine | Admitting: Family Medicine

## 2018-07-08 DIAGNOSIS — Z1239 Encounter for other screening for malignant neoplasm of breast: Secondary | ICD-10-CM | POA: Diagnosis not present

## 2018-07-19 ENCOUNTER — Encounter: Payer: Self-pay | Admitting: Family Medicine

## 2018-07-19 ENCOUNTER — Ambulatory Visit (INDEPENDENT_AMBULATORY_CARE_PROVIDER_SITE_OTHER): Payer: No Typology Code available for payment source | Admitting: Family Medicine

## 2018-07-19 VITALS — BP 120/80 | HR 86 | Temp 98.7°F | Resp 16 | Ht 60.63 in | Wt 212.2 lb

## 2018-07-19 DIAGNOSIS — Z23 Encounter for immunization: Secondary | ICD-10-CM

## 2018-07-19 DIAGNOSIS — Z1211 Encounter for screening for malignant neoplasm of colon: Secondary | ICD-10-CM

## 2018-07-19 DIAGNOSIS — Z01419 Encounter for gynecological examination (general) (routine) without abnormal findings: Secondary | ICD-10-CM | POA: Diagnosis not present

## 2018-07-19 DIAGNOSIS — Z124 Encounter for screening for malignant neoplasm of cervix: Secondary | ICD-10-CM

## 2018-07-19 NOTE — Patient Instructions (Signed)
Preventive Care 40-64 Years, Female Preventive care refers to lifestyle choices and visits with your health care provider that can promote health and wellness. What does preventive care include?  A yearly physical exam. This is also called an annual well check.  Dental exams once or twice a year.  Routine eye exams. Ask your health care provider how often you should have your eyes checked.  Personal lifestyle choices, including: ? Daily care of your teeth and gums. ? Regular physical activity. ? Eating a healthy diet. ? Avoiding tobacco and drug use. ? Limiting alcohol use. ? Practicing safe sex. ? Taking low-dose aspirin daily starting at age 58. ? Taking vitamin and mineral supplements as recommended by your health care provider. What happens during an annual well check? The services and screenings done by your health care provider during your annual well check will depend on your age, overall health, lifestyle risk factors, and family history of disease. Counseling Your health care provider may ask you questions about your:  Alcohol use.  Tobacco use.  Drug use.  Emotional well-being.  Home and relationship well-being.  Sexual activity.  Eating habits.  Work and work Statistician.  Method of birth control.  Menstrual cycle.  Pregnancy history.  Screening You may have the following tests or measurements:  Height, weight, and BMI.  Blood pressure.  Lipid and cholesterol levels. These may be checked every 5 years, or more frequently if you are over 81 years old.  Skin check.  Lung cancer screening. You may have this screening every year starting at age 78 if you have a 30-pack-year history of smoking and currently smoke or have quit within the past 15 years.  Fecal occult blood test (FOBT) of the stool. You may have this test every year starting at age 65.  Flexible sigmoidoscopy or colonoscopy. You may have a sigmoidoscopy every 5 years or a colonoscopy  every 10 years starting at age 30.  Hepatitis C blood test.  Hepatitis B blood test.  Sexually transmitted disease (STD) testing.  Diabetes screening. This is done by checking your blood sugar (glucose) after you have not eaten for a while (fasting). You may have this done every 1-3 years.  Mammogram. This may be done every 1-2 years. Talk to your health care provider about when you should start having regular mammograms. This may depend on whether you have a family history of breast cancer.  BRCA-related cancer screening. This may be done if you have a family history of breast, ovarian, tubal, or peritoneal cancers.  Pelvic exam and Pap test. This may be done every 3 years starting at age 80. Starting at age 36, this may be done every 5 years if you have a Pap test in combination with an HPV test.  Bone density scan. This is done to screen for osteoporosis. You may have this scan if you are at high risk for osteoporosis.  Discuss your test results, treatment options, and if necessary, the need for more tests with your health care provider. Vaccines Your health care provider may recommend certain vaccines, such as:  Influenza vaccine. This is recommended every year.  Tetanus, diphtheria, and acellular pertussis (Tdap, Td) vaccine. You may need a Td booster every 10 years.  Varicella vaccine. You may need this if you have not been vaccinated.  Zoster vaccine. You may need this after age 5.  Measles, mumps, and rubella (MMR) vaccine. You may need at least one dose of MMR if you were born in  1956 or later. You may also need a second dose.  Pneumococcal 13-valent conjugate (PCV13) vaccine. You may need this if you have certain conditions and were not previously vaccinated.  Pneumococcal polysaccharide (PPSV23) vaccine. You may need one or two doses if you smoke cigarettes or if you have certain conditions.  Meningococcal vaccine. You may need this if you have certain  conditions.  Hepatitis A vaccine. You may need this if you have certain conditions or if you travel or work in places where you may be exposed to hepatitis A.  Hepatitis B vaccine. You may need this if you have certain conditions or if you travel or work in places where you may be exposed to hepatitis B.  Haemophilus influenzae type b (Hib) vaccine. You may need this if you have certain conditions.  Talk to your health care provider about which screenings and vaccines you need and how often you need them. This information is not intended to replace advice given to you by your health care provider. Make sure you discuss any questions you have with your health care provider. Document Released: 08/16/2015 Document Revised: 04/08/2016 Document Reviewed: 05/21/2015 Elsevier Interactive Patient Education  2018 Elsevier Inc.  

## 2018-07-19 NOTE — Progress Notes (Signed)
Name: Debra Contreras   MRN: [id number]    DOB: [birth year]   Date:07/19/2018       Progress Note  Subjective  Chief Complaint  Chief Complaint  Patient presents with  . Annual Exam    HPI   Patient presents for annual CPE   Diet: "it sucks right now" , but she will improve, having a challenge at work  Exercise: walking on treadmill about 2 times a week for about 20-25 minutes       Office Visit from 07/19/2018 in [facility]  AUDIT-C Score  2     Depression:  Depression screen [facility] 2/9 07/19/2018 04/25/2018 04/07/2018 03/18/2017 06/17/2016  Decreased Interest 0 0 0 0 0  Down, Depressed, Hopeless 0 0 0 0 0  PHQ - 2 Score 0 0 0 0 0  Altered sleeping - 0 0 - -  Tired, decreased energy - 0 0 - -  Change in appetite - 0 0 - -  Feeling bad or failure about yourself  - 0 0 - -  Trouble concentrating - 0 0 - -  Moving slowly or fidgety/restless - 0 0 - -  Suicidal thoughts - 0 0 - -  PHQ-9 Score - 0 0 - -  Difficult doing work/chores - Not difficult at all Not difficult at all - -   Hypertension: BP Readings from Last 3 Encounters:  07/19/18 120/80  04/25/18 123/85  04/25/18 132/80   Obesity: Wt Readings from Last 3 Encounters:  07/19/18 212 lb 3.2 oz (96.3 kg)  04/25/18 211 lb (95.7 kg)  04/25/18 211 lb 8 oz (95.9 kg)   BMI Readings from Last 3 Encounters:  07/19/18 40.59 kg/m  04/25/18 39.87 kg/m  04/25/18 39.96 kg/m    Hep C Screening: she wants to hold off until next visit  STD testing and prevention (HIV/chl/gon/syphilis): not interested today  Intimate partner violence:negative screen  Sexual History/Pain during Intercourse: not currently sexually active  Menstrual History/LMP/Abnormal Bleeding:s/p hysterectomy  Incontinence Symptoms: none   Advanced Care Planning: A voluntary discussion about advance care planning including the explanation and discussion of advance directives.  Discussed health care proxy and Living will, and the  patient was able to identify a health care proxy as mother .  Patient does not have a living will at present time.  Breast cancer:  [facility] Mammogram  Date Value Ref Range Status  08/16/2014   Final   right breast oil cyst on [place], patient felt mass, repeat in one year    BRCA gene screening: N/A Cervical cancer screening: not a candidate   Osteoporosis Screening: discussed bone density but she wants to hold off for now   Lipids:  Lab Results  Component Value Date   CHOL 205 (H) 10/22/2017   CHOL 225 (H) 03/18/2017   CHOL 237 (H) 12/08/2016   Lab Results  Component Value Date   HDL 54 10/22/2017   HDL 47 (L) 03/18/2017   HDL 42 (L) 12/08/2016   Lab Results  Component Value Date   LDLCALC 132 (H) 10/22/2017   LDLCALC 152 (H) 03/18/2017   LDLCALC 159 (H) 12/08/2016   Lab Results  Component Value Date   TRIG 90 10/22/2017   TRIG 129 03/18/2017   TRIG 181 (H) 12/08/2016   Lab Results  Component Value Date   CHOLHDL 3.8 10/22/2017   CHOLHDL 4.8 03/18/2017   CHOLHDL 5.6 (H) 12/08/2016   No results found for: LDLDIRECT  Glucose:  Glucose, Bld  Date Value Ref Range Status  04/25/2018 95 70 - 99 mg/dL Final  04/07/2018 78 65 - 99 mg/dL Final    Comment:    .            Fasting reference interval .   10/22/2017 82 65 - 99 mg/dL Final    Comment:    .            Fasting reference interval .     Skin cancer: discussed atypical lesions  Colorectal cancer: referral placed, lost to follow up  Lung cancer:   Low Dose CT Chest recommended if Age 103-80 years, 30 pack-year currently smoking OR have quit w/in 15years. Patient does not qualify.   ECG: 2014  Patient Active Problem List   Diagnosis Date Noted  . 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 History of partial thyroidectomy 10/05/2015  . H/O thyroid nodule 10/03/2015  . Morbid obesity ([place]) 06/04/2015  . Breast lump in female 05/17/2015  . Acanthosis nigricans 03/09/2015  . Allergic rhinitis, seasonal  03/09/2015  . Diabetes mellitus with renal manifestation ([place]) 03/09/2015  . Dyslipidemia 03/09/2015  . Gastro-esophageal reflux disease without esophagitis 03/09/2015  . Hiatal hernia 03/09/2015  . History of anemia 03/09/2015  . H/O: hysterectomy 03/09/2015  . Uterus, adenomyosis 03/09/2015  . Calculus of kidney 03/09/2015  . Benign hypertension 03/09/2015  . Extreme obesity 03/09/2015  . Vitamin D deficiency 03/09/2015  . Apnea, sleep 09/12/2014  . Microalbuminuria 05/29/2014    Past Surgical History:  Procedure Laterality Date  . ABDOMINAL HYSTERECTOMY  03/2012  . BREAST BIOPSY Right 12/2005   Normal-Cyst  . BREAST SURGERY Bilateral 1990   Reduction  . COLONOSCOPY WITH PROPOFOL N/A 05/07/2017   Procedure: COLONOSCOPY WITH PROPOFOL;  Surgeon: Jonathon Bellows, MD;  Location: [facility] ENDOSCOPY;  Service: Gastroenterology;  Laterality: N/A;  . OOPHORECTOMY Left 03/2011  . REDUCTION MAMMAPLASTY Bilateral   . SLEEVE GASTROPLASTY  09/2016   [place]  . thyroidectomy Left 10/08/2015    Family History  Problem Relation Age of Onset  . Hypertension Mother   . Cirrhosis Mother   . COPD Mother        Heavy Smoker  . Hepatitis C Mother   . Alcohol abuse Father   . Heart disease Father   . Heart attack Father   . Diabetes Brother   . Obesity Brother   . Lung cancer Maternal Grandmother        Snuff  . Diabetes Maternal Grandmother   . Alcoholism Maternal Grandmother   . Prostate cancer Paternal Grandfather   . Alcoholism Maternal Grandfather   . Diabetes Maternal Grandfather   . Breast cancer Cousin     Social History   Socioeconomic History  . Marital status: Divorced    Spouse name: Not on file  . Number of children: Not on file  . Years of education: Not on file  . Highest education level: Not on file  Occupational History  . Occupation: Designer, jewellery   Social Needs  . Financial resource strain: Not hard at all  . Food insecurity:    Worry: Never true    Inability:  Never true  . Transportation needs:    Medical: No    Non-medical: No  Tobacco Use  . Smoking status: Never Smoker  . Smokeless tobacco: Never Used  Substance and Sexual Activity  . Alcohol use: Yes    Alcohol/week: 0.0 standard drinks    Comment: wine-social drinker  . Drug use: No  .  Sexual activity: Yes    Partners: Male  Lifestyle  . Physical activity:    Days per week: 0 days    Minutes per session: 0 min  . Stress: Not at all  Relationships  . Social connections:    Talks on phone: More than three times a week    Gets together: More than three times a week    Attends religious service: More than 4 times per year    Active member of club or organization: Yes    Attends meetings of clubs or organizations: Never    Relationship status: Married  . Intimate partner violence:    Fear of current or ex partner: No    Emotionally abused: No    Physically abused: No    Forced sexual activity: No  Other Topics Concern  . Not on file  Social History Narrative   Lives alone   She is a Designer, jewellery.      Current Outpatient Medications:  .  Cholecalciferol (VITAMIN D) 2000 units CAPS, Take 1 capsule (2,000 Units total) by mouth daily., Disp: 30 capsule, Rfl:  .  Flaxseed, Linseed, (FLAXSEED OIL PO), Take by mouth., Disp: , Rfl:  .  fluticasone (FLONASE) 50 MCG/ACT nasal spray, Place 2 sprays into both nostrils daily., Disp: 16 g, Rfl: 6 .  lisinopril (PRINIVIL,ZESTRIL) 5 MG tablet, Take 1 tablet (5 mg total) by mouth daily., Disp: 90 tablet, Rfl: 1 .  loratadine (CLARITIN) 10 MG tablet, Take by mouth., Disp: , Rfl:  .  Multiple Vitamin (MULTIVITAMIN) tablet, Take 1 tablet by mouth daily., Disp: , Rfl:  .  vitamin C (ASCORBIC ACID) 500 MG tablet, Take 500 mg by mouth daily., Disp: , Rfl:  .  Thiamine HCl (VITAMIN B-1 PO), Take 500 mg by mouth daily., Disp: , Rfl:  .  vitamin B-12 (CYANOCOBALAMIN) 1000 MCG tablet, Take 1,000 mcg by mouth 2 (two) times a week., Disp: , Rfl:    Allergies  Allergen Reactions  . Latex Rash     ROS  Constitutional: Negative for fever or significant  weight change.  Respiratory: Negative for cough and shortness of breath.   Cardiovascular: Negative for chest pain or palpitations.  Gastrointestinal: Negative for abdominal pain, no bowel changes.  Musculoskeletal: Negative for gait problem or joint swelling.  Skin: Negative for rash.  Neurological: Negative for dizziness or headache.  No other specific complaints in a complete review of systems (except as listed in HPI above).  Objective  Vitals:   07/19/18 0822  BP: 120/80  Pulse: 86  Resp: 16  Temp: 98.7 F (37.1 C)  TempSrc: Oral  SpO2: 97%  Weight: 212 lb 3.2 oz (96.3 kg)  Height: 5' 0.63" (1.54 m)    Body mass index is 40.59 kg/m.  Physical Exam  Constitutional: Patient appears well-developed and well-nourished. Obese . No distress.  HENT: Head: Normocephalic and atraumatic. Ears: B TMs ok, no erythema or effusion; Nose: Nose normal. Mouth/Throat: Oropharynx is clear and moist. No oropharyngeal exudate.  Eyes: Conjunctivae and EOM are normal. Pupils are equal, round, and reactive to light. No scleral icterus.  Neck: Normal range of motion. Neck supple. No JVD present. No thyromegaly present.  Cardiovascular: Normal rate, regular rhythm and normal heart sounds.  No murmur heard. No BLE edema. Pulmonary/Chest: Effort normal and breath sounds normal. No respiratory distress. Abdominal: Soft. Bowel sounds are normal, no distension. There is no tenderness. no masses Breast: no lumps or masses, no nipple discharge or rashes,  scarring for previous breast reduction surgery  FEMALE GENITALIA:  Not done  RECTAL: not done Musculoskeletal: Normal range of motion, no joint effusions. No gross deformities Neurological: he is alert and oriented to person, place, and time. No cranial nerve deficit. Coordination, balance, strength, speech and gait are normal.  Skin: Skin  is warm and dry. No rash noted. No erythema. Hypertrichosis  Psychiatric: Patient has a normal mood and affect. behavior is normal. Judgment and thought content normal.   Recent Results (from the past 2160 hour(s))  CBC with Differential     Status: None   Collection Time: 04/25/18  4:01 PM  Result Value Ref Range   WBC 6.3 3.6 - 11.0 K/uL   RBC 4.48 3.80 - 5.20 MIL/uL   Hemoglobin 13.7 12.0 - 16.0 g/dL   HCT 39.1 35.0 - 47.0 %   MCV 87.3 80.0 - 100.0 fL   MCH 30.6 26.0 - 34.0 pg   MCHC 35.0 32.0 - 36.0 g/dL   RDW 13.3 11.5 - 14.5 %   Platelets 292 150 - 440 K/uL   Neutrophils Relative % 45 %   Neutro Abs 2.9 1.4 - 6.5 K/uL   Lymphocytes Relative 44 %   Lymphs Abs 2.8 1.0 - 3.6 K/uL   Monocytes Relative 8 %   Monocytes Absolute 0.5 0.2 - 0.9 K/uL   Eosinophils Relative 2 %   Eosinophils Absolute 0.1 0 - 0.7 K/uL   Basophils Relative 1 %   Basophils Absolute 0.0 0 - 0.1 K/uL    Comment: Performed at [facility], [address] [place], [place] [address]  Basic metabolic panel     Status: None   Collection Time: 04/25/18  4:01 PM  Result Value Ref Range   Sodium 138 135 - 145 mmol/L   Potassium 3.8 3.5 - 5.1 mmol/L   Chloride 103 98 - 111 mmol/L   CO2 27 22 - 32 mmol/L   Glucose, Bld 95 70 - 99 mg/dL   BUN 13 6 - 20 mg/dL   Creatinine, Ser 0.78 0.44 - 1.00 mg/dL   Calcium 9.3 8.9 - 10.3 mg/dL   GFR calc non Af Amer >60 >60 mL/min   GFR calc Af Amer >60 >60 mL/min    Comment: (NOTE) The eGFR has been calculated using the CKD EPI equation. This calculation has not been validated in all clinical situations. eGFR's persistently <60 mL/min signify possible Chronic Kidney Disease.    Anion gap 8 5 - 15    Comment: Performed at [facility], [address] [place], [place] [address]      PHQ2/9: Depression screen [facility] 2/9 07/19/2018 04/25/2018 04/07/2018 03/18/2017 06/17/2016  Decreased Interest 0 0 0 0 0  Down, Depressed, Hopeless 0 0 0 0 0  PHQ - 2  Score 0 0 0 0 0  Altered sleeping - 0 0 - -  Tired, decreased energy - 0 0 - -  Change in appetite - 0 0 - -  Feeling bad or failure about yourself  - 0 0 - -  Trouble concentrating - 0 0 - -  Moving slowly or fidgety/restless - 0 0 - -  Suicidal thoughts - 0 0 - -  PHQ-9 Score - 0 0 - -  Difficult doing work/chores - Not difficult at all Not difficult at all - -     Fall Risk: Fall Risk  07/19/2018 04/25/2018 04/07/2018 10/22/2017 10/22/2017  Falls in the past year? 0 No No No No  Number falls in past yr: 0 - - - -  Injury with Fall? 0 - - - -     Functional Status Survey: Is the patient deaf or have difficulty hearing?: No Does the patient have difficulty seeing, even when wearing glasses/contacts?: No Does the patient have difficulty concentrating, remembering, or making decisions?: No Does the patient have difficulty walking or climbing stairs?: No Does the patient have difficulty dressing or bathing?: No Does the patient have difficulty doing errands alone such as visiting a doctor's office or shopping?: No   Assessment & Plan  1. Well woman exam   2. Colon cancer screening  - Ambulatory referral to Gastroenterology - seen by Dr. Vicente Males in 2018 and has to go back because she had two polyps that were not removed, she has a new insurance now  3. Cervical cancer screening  Not a candidate since had hysterectomy for benign causes in 2013   4. Need for hepatitis A immunization  - Hepatitis A vaccine adult IM  -USPSTF grade A and B recommendations reviewed with patient; age-appropriate recommendations, preventive care, screening tests, etc discussed and encouraged; healthy living encouraged; see AVS for patient education given to patient -Discussed importance of 150 minutes of physical activity weekly, eat two servings of fish weekly, eat one serving of tree nuts ( cashews, pistachios, pecans, almonds.Marland Kitchen) every other day, eat 6 servings of fruit/vegetables daily and drink  plenty of water and avoid sweet beverages.

## 2018-08-22 ENCOUNTER — Encounter: Payer: Self-pay | Admitting: *Deleted

## 2018-09-29 ENCOUNTER — Ambulatory Visit: Payer: No Typology Code available for payment source | Admitting: Nurse Practitioner

## 2018-10-06 ENCOUNTER — Ambulatory Visit: Payer: No Typology Code available for payment source | Admitting: Family Medicine

## 2018-10-06 ENCOUNTER — Encounter: Payer: Self-pay | Admitting: Family Medicine

## 2018-10-06 VITALS — BP 130/90 | HR 80 | Temp 98.7°F | Resp 16 | Ht 61.0 in | Wt 212.3 lb

## 2018-10-06 DIAGNOSIS — R809 Proteinuria, unspecified: Secondary | ICD-10-CM | POA: Diagnosis not present

## 2018-10-06 DIAGNOSIS — E559 Vitamin D deficiency, unspecified: Secondary | ICD-10-CM

## 2018-10-06 DIAGNOSIS — E538 Deficiency of other specified B group vitamins: Secondary | ICD-10-CM

## 2018-10-06 DIAGNOSIS — E1129 Type 2 diabetes mellitus with other diabetic kidney complication: Secondary | ICD-10-CM | POA: Diagnosis not present

## 2018-10-06 DIAGNOSIS — E785 Hyperlipidemia, unspecified: Secondary | ICD-10-CM

## 2018-10-06 DIAGNOSIS — E890 Postprocedural hypothyroidism: Secondary | ICD-10-CM

## 2018-10-06 DIAGNOSIS — J3089 Other allergic rhinitis: Secondary | ICD-10-CM

## 2018-10-06 DIAGNOSIS — Z9884 Bariatric surgery status: Secondary | ICD-10-CM

## 2018-10-06 DIAGNOSIS — E519 Thiamine deficiency, unspecified: Secondary | ICD-10-CM

## 2018-10-06 DIAGNOSIS — Z9009 Acquired absence of other part of head and neck: Secondary | ICD-10-CM

## 2018-10-06 MED ORDER — FLUTICASONE PROPIONATE 50 MCG/ACT NA SUSP: 2.0000 | Freq: Every day | NASAL | 6 refills | Status: AC

## 2018-10-06 MED ORDER — LISINOPRIL 5 MG PO TABS: 5.0000 mg | ORAL_TABLET | Freq: Every day | ORAL | 1 refills | Status: DC

## 2018-10-06 NOTE — Progress Notes (Signed)
Name: Debra Contreras   MRN: [id number]    DOB: 1966-03-30   Date:10/06/2018       Progress Note  Subjective  Chief Complaint  Chief Complaint  Patient presents with  . Medication Refill  . Hypothyroidism    HPI  Thyroid nodule: she was sitting on her desk working on a report and felt a large nodule on the left side back in October 2016, she had a partial thyroidectomy in March 2017, and is doing well since. Last TSH was at goal; no skin/hair/nail changes, no fatigue, no heart palpitations, denies constipation.   DMII with renal manifestationand dyslipidemia: she is doing well, no longer taking Metforminbut still takes Lisinopril 5mg  for proteinuria, urine micro was elevated in the past at  50, last level was normal. She is not checking glucose at home. Eye exam is up to date. She denies polyphagia, polydipsia or polyuria.  Obesity:  Starting weight on April 9th, 2016 was 274.4 lbs. Pt has Gastric Sleeve surgery 09/16/2016, her weight is stable now, she states splurges when she travels.  Marland Kitchen HTN: DBP is elevated today but usually at goal, she is  taking lisinopril now mostly for kidney protection, no chest pain or palpitation.   Hot flashes: she has a hysterectomy without BSO back in 2013 and over the past 12 months she has noticed hot flashes during the day but worse at night, about three times per day now . Tried flaxseed oil and states improved a little her symptoms, she has not tried black cohosh yet   Zinc allergic: she had two episodes of intense abdominal pain, nausea , vomiting and diarrhea when she took zinc, no symptoms now.   OSA: not using CPAP machine because she can't afford it.Symptoms improved with  weight loss and thyroidectomy. She denies fatigues or headache at this time   Patient Active Problem List   Diagnosis Date Noted  . 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 History of partial thyroidectomy 10/05/2015  . H/O thyroid nodule 10/03/2015  . Morbid  obesity ([place]) 06/04/2015  . Breast lump in female 05/17/2015  . Acanthosis nigricans 03/09/2015  . Allergic rhinitis, seasonal 03/09/2015  . Diabetes mellitus with renal manifestation ([place]) 03/09/2015  . Dyslipidemia 03/09/2015  . Gastro-esophageal reflux disease without esophagitis 03/09/2015  . Hiatal hernia 03/09/2015  . History of anemia 03/09/2015  . H/O: hysterectomy 03/09/2015  . Uterus, adenomyosis 03/09/2015  . Calculus of kidney 03/09/2015  . Benign hypertension 03/09/2015  . Extreme obesity 03/09/2015  . Vitamin D deficiency 03/09/2015  . Apnea, sleep 09/12/2014  . Microalbuminuria 05/29/2014    Past Surgical History:  Procedure Laterality Date  . ABDOMINAL HYSTERECTOMY  03/2012  . BREAST BIOPSY Right 12/2005   Normal-Cyst  . BREAST SURGERY Bilateral 1990   Reduction  . COLONOSCOPY WITH PROPOFOL N/A 05/07/2017   Procedure: COLONOSCOPY WITH PROPOFOL;  Surgeon: Jonathon Bellows, MD;  Location: [facility] ENDOSCOPY;  Service: Gastroenterology;  Laterality: N/A;  . OOPHORECTOMY Left 03/2011  . REDUCTION MAMMAPLASTY Bilateral   . SLEEVE GASTROPLASTY  09/2016   [place]  . thyroidectomy Left 10/08/2015    Family History  Problem Relation Age of Onset  . Hypertension Mother   . Cirrhosis Mother   . COPD Mother        Heavy Smoker  . Hepatitis C Mother   . Alcohol abuse Father   . Heart disease Father   . Heart attack Father   . Diabetes Brother   . Obesity Brother   .  Lung cancer Maternal Grandmother        Snuff  . Diabetes Maternal Grandmother   . Alcoholism Maternal Grandmother   . Prostate cancer Paternal Grandfather   . Alcoholism Maternal Grandfather   . Diabetes Maternal Grandfather   . Breast cancer Cousin     Social History   Socioeconomic History  . Marital status: Divorced    Spouse name: Not on file  . Number of children: 0  . Years of education: Not on file  . Highest education level: Bachelor's degree (e.g., BA, AB, BS)  Occupational History  .  Occupation: Designer, jewellery   Social Needs  . Financial resource strain: Not hard at all  . Food insecurity:    Worry: Never true    Inability: Never true  . Transportation needs:    Medical: No    Non-medical: No  Tobacco Use  . Smoking status: Never Smoker  . Smokeless tobacco: Never Used  Substance and Sexual Activity  . Alcohol use: Yes    Alcohol/week: 0.0 standard drinks    Comment: wine-social drinker  . Drug use: No  . Sexual activity: Yes    Partners: Male  Lifestyle  . Physical activity:    Days per week: 0 days    Minutes per session: 0 min  . Stress: Not at all  Relationships  . Social connections:    Talks on phone: More than three times a week    Gets together: More than three times a week    Attends religious service: More than 4 times per year    Active member of club or organization: Yes    Attends meetings of clubs or organizations: More than 4 times per year    Relationship status: Divorced  . Intimate partner violence:    Fear of current or ex partner: No    Emotionally abused: No    Physically abused: No    Forced sexual activity: No  Other Topics Concern  . Not on file  Social History Narrative   Lives alone   She is a Designer, jewellery.      Current Outpatient Medications:  .  Cholecalciferol (VITAMIN D) 2000 units CAPS, Take 1 capsule (2,000 Units total) by mouth daily., Disp: 30 capsule, Rfl:  .  Flaxseed, Linseed, (FLAXSEED OIL PO), Take by mouth., Disp: , Rfl:  .  fluticasone (FLONASE) 50 MCG/ACT nasal spray, Place 2 sprays into both nostrils daily., Disp: 16 g, Rfl: 6 .  lisinopril (PRINIVIL,ZESTRIL) 5 MG tablet, Take 1 tablet (5 mg total) by mouth daily., Disp: 90 tablet, Rfl: 1 .  loratadine (CLARITIN) 10 MG tablet, Take by mouth., Disp: , Rfl:  .  Multiple Vitamin (MULTIVITAMIN) tablet, Take 1 tablet by mouth daily., Disp: , Rfl:  .  Thiamine HCl (VITAMIN B-1 PO), Take 500 mg by mouth daily., Disp: , Rfl:  .  vitamin B-12  (CYANOCOBALAMIN) 1000 MCG tablet, Take 1,000 mcg by mouth 2 (two) times a week., Disp: , Rfl:  .  vitamin C (ASCORBIC ACID) 500 MG tablet, Take 500 mg by mouth daily., Disp: , Rfl:   Allergies  Allergen Reactions  . Latex Rash    I personally reviewed active problem list, medication list, allergies, family history, social history with the patient/caregiver today.   ROS  Constitutional: Negative for fever or weight change.  Respiratory: Negative for cough and shortness of breath.   Cardiovascular: Negative for chest pain or palpitations.  Gastrointestinal: Negative for abdominal pain, no bowel  changes.  Musculoskeletal: Negative for gait problem or joint swelling.  Skin: Negative for rash.  Neurological: Negative for dizziness or headache.  No other specific complaints in a complete review of systems (except as listed in HPI above).  Objective  Vitals:   10/06/18 0744  BP: 130/90  Pulse: 80  Resp: 16  Temp: 98.7 F (37.1 C)  TempSrc: Oral  SpO2: 99%  Weight: 212 lb 4.8 oz (96.3 kg)  Height: 5\' 1"  (1.549 m)    Body mass index is 40.11 kg/m.  Physical Exam  Constitutional: Patient appears well-developed and well-nourished. Obese  No distress.  HEENT: head atraumatic, normocephalic, pupils equal and reactive to light,  neck supple, throat within normal limits Cardiovascular: Normal rate, regular rhythm and normal heart sounds.  No murmur heard. No BLE edema. Pulmonary/Chest: Effort normal and breath sounds normal. No respiratory distress. Abdominal: Soft.  There is no tenderness. Psychiatric: Patient has a normal mood and affect. behavior is normal. Judgment and thought content normal.  PHQ2/9: Depression screen [facility] 2/9 10/06/2018 07/19/2018 04/25/2018 04/07/2018 03/18/2017  Decreased Interest 0 0 0 0 0  Down, Depressed, Hopeless 0 0 0 0 0  PHQ - 2 Score 0 0 0 0 0  Altered sleeping 0 - 0 0 -  Tired, decreased energy 0 - 0 0 -  Change in appetite 0 - 0 0 -  Feeling bad  or failure about yourself  0 - 0 0 -  Trouble concentrating 0 - 0 0 -  Moving slowly or fidgety/restless 0 - 0 0 -  Suicidal thoughts 0 - 0 0 -  PHQ-9 Score 0 - 0 0 -  Difficult doing work/chores - - Not difficult at all Not difficult at all -     Fall Risk: Fall Risk  10/06/2018 07/19/2018 04/25/2018 04/07/2018 10/22/2017  Falls in the past year? 0 0 No No No  Number falls in past yr: 0 0 - - -  Injury with Fall? 0 0 - - -    Assessment & Plan  1. Type 2 diabetes mellitus with microalbuminuria, without long-term current use of insulin (HCC)  - COMPLETE METABOLIC PANEL WITH GFR - Hemoglobin A1c  2. Microalbuminuria  - lisinopril (PRINIVIL,ZESTRIL) 5 MG tablet; Take 1 tablet (5 mg total) by mouth daily.  Dispense: 90 tablet; Refill: 1  3. B12 deficiency  - B12 and Folate Panel  4. Vitamin D deficiency  - VITAMIN D 25 Hydroxy (Vit-D Deficiency, Fractures)  5. Vitamin B1 deficiency  - Lipid panel - Vitamin B1  6. Dyslipidemia   7. Morbid obesity ([place])  Discussed with the patient the risk posed by an increased BMI. Discussed importance of portion control, calorie counting and at least 150 minutes of physical activity weekly. Avoid sweet beverages and drink more water. Eat at least 6 servings of fruit and vegetables daily   8. History of partial thyroidectomy  - TSH  9. History of bariatric surgery  - CBC with Differential/Platelet  10. Perennial allergic rhinitis  - fluticasone (FLONASE) 50 MCG/ACT nasal spray; Place 2 sprays into both nostrils daily.  Dispense: 16 g; Refill: 6

## 2018-10-10 LAB — TSH: TSH: 3.05 m[IU]/L

## 2018-10-10 LAB — COMPLETE METABOLIC PANEL WITH GFR
AG RATIO: 1.6 (calc) (ref 1.0–2.5)
ALBUMIN MSPROF: 4.6 g/dL (ref 3.6–5.1)
ALKALINE PHOSPHATASE (APISO): 60 U/L (ref 37–153)
ALT: 19 U/L (ref 6–29)
AST: 16 U/L (ref 10–35)
BILIRUBIN TOTAL: 0.3 mg/dL (ref 0.2–1.2)
BUN: 14 mg/dL (ref 7–25)
CHLORIDE: 105 mmol/L (ref 98–110)
CO2: 31 mmol/L (ref 20–32)
Calcium: 9.9 mg/dL (ref 8.6–10.4)
Creat: 0.67 mg/dL (ref 0.50–1.05)
GFR, Est African American: 118 mL/min/{1.73_m2} (ref >=60)
GFR, Est Non African American: 102 mL/min/{1.73_m2} (ref >=60)
GLOBULIN: 2.8 g/dL (ref 1.9–3.7)
GLUCOSE: 81 mg/dL (ref 65–99)
POTASSIUM: 4 mmol/L (ref 3.5–5.3)
SODIUM: 141 mmol/L (ref 135–146)
Total Protein: 7.4 g/dL (ref 6.1–8.1)

## 2018-10-10 LAB — CBC WITH DIFFERENTIAL/PLATELET
Absolute Monocytes: 269 cells/uL (ref 200–950)
BASOS PCT: 0.8 %
Basophils Absolute: 31 cells/uL (ref 0–200)
EOS PCT: 2.1 %
Eosinophils Absolute: 82 cells/uL (ref 15–500)
HEMATOCRIT: 40.2 % (ref 35.0–45.0)
HEMOGLOBIN: 13.2 g/dL (ref 11.7–15.5)
LYMPHS ABS: 2508 {cells}/uL (ref 850–3900)
MCH: 28.4 pg (ref 27.0–33.0)
MCHC: 32.8 g/dL (ref 32.0–36.0)
MCV: 86.5 fL (ref 80.0–100.0)
MONOS PCT: 6.9 %
MPV: 10.8 fL (ref 7.5–12.5)
NEUTROS ABS: 1010 {cells}/uL — AB (ref 1500–7800)
Neutrophils Relative %: 25.9 %
Platelets: 323 10*3/uL (ref 140–400)
RBC: 4.65 10*6/uL (ref 3.80–5.10)
RDW: 13 % (ref 11.0–15.0)
Total Lymphocyte: 64.3 %
WBC: 3.9 10*3/uL (ref 3.8–10.8)

## 2018-10-10 LAB — VITAMIN D 25 HYDROXY (VIT D DEFICIENCY, FRACTURES): Vit D, 25-Hydroxy: 41 ng/mL (ref 30–100)

## 2018-10-10 LAB — HEMOGLOBIN A1C
HEMOGLOBIN A1C: 5.6 %{Hb} (ref <5.7)
Mean Plasma Glucose: 114 (calc)
eAG (mmol/L): 6.3 (calc)

## 2018-10-10 LAB — B12 AND FOLATE PANEL: VITAMIN B 12: 883 pg/mL (ref 200–1100)

## 2018-10-10 LAB — LIPID PANEL
CHOL/HDL RATIO: 5.2 (calc) — AB (ref <5.0)
Cholesterol: 273 mg/dL — ABNORMAL HIGH (ref <200)
HDL: 53 mg/dL (ref >=50)
LDL CHOLESTEROL (CALC): 179 mg/dL — AB
Non-HDL Cholesterol (Calc): 220 mg/dL (calc) — ABNORMAL HIGH (ref <130)
Triglycerides: 215 mg/dL — ABNORMAL HIGH (ref <150)

## 2018-10-10 LAB — VITAMIN B1: VITAMIN B1 (THIAMINE): 16 nmol/L (ref 8–30)

## 2018-11-03 ENCOUNTER — Telehealth: Payer: No Typology Code available for payment source | Admitting: Physician Assistant

## 2018-11-03 DIAGNOSIS — B349 Viral infection, unspecified: Secondary | ICD-10-CM

## 2018-11-03 NOTE — Progress Notes (Signed)
E-Visit for Corona Virus Screening  Based on your current symptoms, you may very well have the virus, however your symptoms are mild. Currently, not all patients are being tested. If the symptoms are mild and there is not a known exposure, performing the test is not indicated.  Coronavirus disease 2019 (COVID-19) is a respiratory illness that can spread from person to person. The virus that causes COVID-19 is a new virus that was first identified in the country of [place] but is now found in multiple other countries and has spread to the [place].  Symptoms associated with the virus are mild to severe fever, cough, and shortness of breath. There is currently no vaccine to protect against COVID-19, and there is no specific antiviral treatment for the virus.   To be considered HIGH RISK for Coronavirus (COVID-19), you have to meet the following criteria:  . Traveled to [place], [place], [place], [place] or [place]; or in the [place] to [place], [place], [place], or [place]; and have fever, cough, and shortness of breath within the last 2 weeks of travel OR  . Been in close contact with a person diagnosed with COVID-19 within the last 2 weeks and have fever, cough, and shortness of breath  . IF YOU DO NOT MEET THESE CRITERIA, YOU ARE CONSIDERED LOW RISK FOR COVID-19.   It is vitally important that if you feel that you have an infection such as this virus or any other virus that you stay home and away from places where you may spread it to others.  You should self-quarantine for 14 days if you have symptoms that could potentially be coronavirus and avoid contact with people age 50 and older.   Reduce your risk of any infection by using the same precautions used for avoiding the common cold or flu:  Marland Kitchen Wash your hands often with soap and warm water for at least 20 seconds.  If soap and water are not readily available, use an alcohol-based hand sanitizer with at least 60% alcohol.  . If  coughing or sneezing, cover your mouth and nose by coughing or sneezing into the elbow areas of your shirt or coat, into a tissue or into your sleeve (not your hands). . Avoid shaking hands with others and consider head nods or verbal greetings only. . Avoid touching your eyes, nose, or mouth with unwashed hands.  . Avoid close contact with people who are sick. . Avoid places or events with large numbers of people in one location, like concerts or sporting events. . Carefully consider travel plans you have or are making. . If you are planning any travel outside or inside the [place], visit the CDC's Travelers' Health webpage for the latest health notices. . If you have some symptoms but not all symptoms, continue to monitor at home and seek medical attention if your symptoms worsen. . If you are having a medical emergency, call 911.  HOME CARE . Only take medications as instructed by your medical team. . Drink plenty of fluids and get plenty of rest. . A steam or ultrasonic humidifier can help if you have congestion.   GET HELP RIGHT AWAY IF: . You develop worsening fever. . You become short of breath . You cough up blood. . Your symptoms become more severe MAKE SURE YOU   Understand these instructions.  Will watch your condition.  Will get help right away if you are not doing well or get worse.  Your e-visit answers  were reviewed by a board certified advanced clinical practitioner to complete your personal care plan.  Depending on the condition, your plan could have included both over the counter or prescription medications.  If there is a problem please reply once you have received a response from your provider. Your safety is important to [place].  If you have drug allergies check your prescription carefully.    You can use MyChart to ask questions about today's visit, request a non-urgent call back, or ask for a work or school excuse for 24 hours related to this e-Visit. If it has been  greater than 24 hours you will need to follow up with your provider, or enter a new e-Visit to address those concerns. You will get an e-mail in the next two days asking about your experience.  I hope that your e-visit has been valuable and will speed your recovery. Thank you for using e-visits.

## 2018-11-03 NOTE — Progress Notes (Signed)
I have spent 5 minutes in review of e-visit questionnaire, review and updating patient chart, medical decision making and response to patient.   Bronwen Pendergraft Cody Marcquis Ridlon, PA-C    

## 2019-03-22 ENCOUNTER — Other Ambulatory Visit: Payer: Self-pay

## 2019-03-22 DIAGNOSIS — Z20822 Contact with and (suspected) exposure to covid-19: Secondary | ICD-10-CM

## 2019-03-23 LAB — NOVEL CORONAVIRUS, NAA: SARS-CoV-2, NAA: NOT DETECTED

## 2019-03-29 ENCOUNTER — Other Ambulatory Visit: Payer: Self-pay

## 2019-03-29 DIAGNOSIS — Z20822 Contact with and (suspected) exposure to covid-19: Secondary | ICD-10-CM

## 2019-03-30 LAB — NOVEL CORONAVIRUS, NAA: SARS-CoV-2, NAA: NOT DETECTED

## 2019-04-05 ENCOUNTER — Other Ambulatory Visit: Payer: Self-pay

## 2019-04-05 DIAGNOSIS — Z20822 Contact with and (suspected) exposure to covid-19: Secondary | ICD-10-CM

## 2019-04-06 LAB — NOVEL CORONAVIRUS, NAA: SARS-CoV-2, NAA: NOT DETECTED

## 2019-04-11 ENCOUNTER — Other Ambulatory Visit: Payer: Self-pay

## 2019-04-11 ENCOUNTER — Ambulatory Visit: Payer: No Typology Code available for payment source | Admitting: Family Medicine

## 2019-04-11 ENCOUNTER — Encounter: Payer: Self-pay | Admitting: Family Medicine

## 2019-04-11 VITALS — BP 118/80 | HR 76 | Temp 97.3°F | Resp 16 | Ht 61.0 in | Wt 224.4 lb

## 2019-04-11 DIAGNOSIS — E1129 Type 2 diabetes mellitus with other diabetic kidney complication: Secondary | ICD-10-CM

## 2019-04-11 DIAGNOSIS — R809 Proteinuria, unspecified: Secondary | ICD-10-CM | POA: Diagnosis not present

## 2019-04-11 DIAGNOSIS — E785 Hyperlipidemia, unspecified: Secondary | ICD-10-CM | POA: Diagnosis not present

## 2019-04-11 DIAGNOSIS — Z23 Encounter for immunization: Secondary | ICD-10-CM

## 2019-04-11 DIAGNOSIS — Z9884 Bariatric surgery status: Secondary | ICD-10-CM

## 2019-04-11 DIAGNOSIS — E1169 Type 2 diabetes mellitus with other specified complication: Secondary | ICD-10-CM

## 2019-04-11 DIAGNOSIS — Z9009 Acquired absence of other part of head and neck: Secondary | ICD-10-CM

## 2019-04-11 DIAGNOSIS — G4733 Obstructive sleep apnea (adult) (pediatric): Secondary | ICD-10-CM

## 2019-04-11 DIAGNOSIS — E890 Postprocedural hypothyroidism: Secondary | ICD-10-CM

## 2019-04-11 LAB — POCT UA - MICROALBUMIN: Microalbumin Ur, POC: NEGATIVE mg/L

## 2019-04-11 LAB — POCT GLYCOSYLATED HEMOGLOBIN (HGB A1C): Hemoglobin A1C: 5.5 % (ref 4.0–5.6)

## 2019-04-11 MED ORDER — EZETIMIBE 10 MG PO TABS: 10.0000 mg | ORAL_TABLET | Freq: Every day | ORAL | 3 refills | Status: DC

## 2019-04-11 NOTE — Progress Notes (Signed)
Name: Debra Contreras   MRN: [id number]    DOB: [birth year]   Date:04/11/2019       Progress Note  Subjective  Chief Complaint  Chief Complaint  Patient presents with  . Diabetes  . Hypertension  . Gastroesophageal Reflux  . Dyslipidemia    HPI  Thyroid nodule: she was sitting on her desk working on a report and felt a large nodule on the left side back in October 2016, she had a partial thyroidectomy in March 2017, and is doing well since. Last TSH was at goal; no skin/hair/nail changes, no fatigue, no heart palpitations,denies constipation. Recheck labs yearly   DMII with renal manifestationand dyslipidemia: she is doing well, no longer taking Metforminbut still takes Lisinopril 5mg  for proteinuria, urine micro was elevated in the past at  50, last level was normal and we will recheck it todayShe is not checking glucose at home. Eye exam is up to date. 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 She bought a bicycle and wants to increase physical activity  . HTN:bp is at goal, denies chest pain or palpitation   Hot flashes: she has a hysterectomy without BSO back in 2013 and was having severe symptoms last year, but doing much better now. Only using Flexseed oil and is doing well  OSA: not using CPAP machine because she can't afford it.Symptoms improved withweight loss and She denies morning headaches   Patient Active Problem List   Diagnosis Date Noted  . 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 History of partial thyroidectomy 10/05/2015  . H/O thyroid nodule 10/03/2015  . Morbid obesity ([place]) 06/04/2015  . Breast lump in female 05/17/2015  . Acanthosis nigricans 03/09/2015  . Allergic rhinitis,  seasonal 03/09/2015  . Diabetes mellitus with renal manifestation ([place]) 03/09/2015  . Dyslipidemia 03/09/2015  . Gastro-esophageal reflux disease without esophagitis 03/09/2015  . Hiatal hernia 03/09/2015  . History of anemia 03/09/2015  . H/O: hysterectomy 03/09/2015  . Uterus, adenomyosis 03/09/2015  . Calculus of kidney 03/09/2015  . Benign hypertension 03/09/2015  . Extreme obesity 03/09/2015  . Vitamin D deficiency 03/09/2015  . Apnea, sleep 09/12/2014  . Microalbuminuria 05/29/2014    Past Surgical History:  Procedure Laterality Date  . ABDOMINAL HYSTERECTOMY  03/2012  . BREAST BIOPSY Right 12/2005   Normal-Cyst  . BREAST SURGERY Bilateral 1990   Reduction  . COLONOSCOPY WITH PROPOFOL N/A 05/07/2017   Procedure: COLONOSCOPY WITH PROPOFOL;  Surgeon: Jonathon Bellows, MD;  [facility] ENDOSCOPY;  Service: Gastroenterology;  Laterality: N/A;  . OOPHORECTOMY Left 03/2011  . REDUCTION MAMMAPLASTY Bilateral   . SLEEVE GASTROPLASTY  09/2016   [place]  . thyroidectomy Left 10/08/2015    Family History  Problem Relation Age of Onset  . Hypertension Mother   . Cirrhosis Mother   . COPD Mother        Heavy Smoker  . Hepatitis C Mother   . Alcohol abuse Father   . Heart disease Father   . Heart attack Father   . Diabetes Brother   . Obesity Brother   . Lung cancer Maternal Grandmother        Snuff  . Diabetes Maternal Grandmother   . Alcoholism Maternal Grandmother   .  Prostate cancer Paternal Grandfather   . Alcoholism Maternal Grandfather   . Diabetes Maternal Grandfather   . Breast cancer Cousin     Social History   Socioeconomic History  . Marital status: Divorced    Spouse name: Not on file  . Number of children: 0  . Years of education: Not on file  . Highest education level: Bachelor's degree (e.g., BA, AB, BS)  Occupational History  . Occupation: Designer, jewellery   Social Needs  . Financial resource strain: Not hard at all  . Food insecurity    Worry:  Never true    Inability: Never true  . Transportation needs    Medical: No    Non-medical: No  Tobacco Use  . Smoking status: Never Smoker  . Smokeless tobacco: Never Used  Substance and Sexual Activity  . Alcohol use: Yes    Alcohol/week: 0.0 standard drinks    Comment: wine-social drinker  . Drug use: No  . Sexual activity: Yes    Partners: Male  Lifestyle  . Physical activity    Days per week: 0 days    Minutes per session: 0 min  . Stress: Not at all  Relationships  . Social connections    Talks on phone: More than three times a week    Gets together: More than three times a week    Attends religious service: More than 4 times per year    Active member of club or organization: Yes    Attends meetings of clubs or organizations: More than 4 times per year    Relationship status: Divorced  . Intimate partner violence    Fear of current or ex partner: No    Emotionally abused: No    Physically abused: No    Forced sexual activity: No  Other Topics Concern  . Not on file  Social History Narrative   Lives alone   She is a Designer, jewellery.      Current Outpatient Medications:  .  Biotin 5000 MCG TABS, Take 5,000 mg by mouth 3 (three) times a week., Disp: , Rfl:  .  Cholecalciferol (VITAMIN D) 2000 units CAPS, Take 1 capsule (2,000 Units total) by mouth daily., Disp: 30 capsule, Rfl:  .  Flaxseed, Linseed, (FLAXSEED OIL PO), Take by mouth., Disp: , Rfl:  .  fluticasone (FLONASE) 50 MCG/ACT nasal spray, Place 2 sprays into both nostrils daily., Disp: 16 g, Rfl: 6 .  lisinopril (PRINIVIL,ZESTRIL) 5 MG tablet, Take 1 tablet (5 mg total) by mouth daily., Disp: 90 tablet, Rfl: 1 .  loratadine (CLARITIN) 10 MG tablet, Take by mouth., Disp: , Rfl:  .  Multiple Vitamin (MULTIVITAMIN) tablet, Take 1 tablet by mouth daily., Disp: , Rfl:  .  Thiamine HCl (VITAMIN B-1 PO), Take 500 mg by mouth daily., Disp: , Rfl:  .  vitamin B-12 (CYANOCOBALAMIN) 1000 MCG tablet, Take 1,000 mcg by  mouth 2 (two) times a week., Disp: , Rfl:  .  vitamin C (ASCORBIC ACID) 500 MG tablet, Take 500 mg by mouth daily., Disp: , Rfl:  .  ezetimibe (ZETIA) 10 MG tablet, Take 1 tablet (10 mg total) by mouth daily., Disp: 90 tablet, Rfl: 3  Allergies  Allergen Reactions  . Zinc     Nausea, vomiting and diarrhea   . Latex Rash    I personally reviewed active problem list, medication list, allergies, family history, social history with the patient/caregiver today.   ROS  Constitutional: Negative for fever or weight  change.  Respiratory: Negative for cough and shortness of breath.   Cardiovascular: Negative for chest pain or palpitations.  Gastrointestinal: Negative for abdominal pain, no bowel changes.  Musculoskeletal: Negative for gait problem or joint swelling.  Skin: Negative for rash.  Neurological: Negative for dizziness or headache.  No other specific complaints in a complete review of systems (except as listed in HPI above).  Objective  Vitals:   04/11/19 0800  BP: 118/80  Pulse: 76  Resp: 16  Temp: (!) 97.3 F (36.3 C)  TempSrc: Temporal  SpO2: 97%  Weight: 224 lb 6.4 oz (101.8 kg)  Height: 5\' 1"  (1.549 m)    Body mass index is 42.4 kg/m.  Physical Exam  Constitutional: Patient appears well-developed and well-nourished. Obese No distress.  HEENT: head atraumatic, normocephalic, pupils equal and reactive to light Cardiovascular: Normal rate, regular rhythm and normal heart sounds.  No murmur heard. No BLE edema. Pulmonary/Chest: Effort normal and breath sounds normal. No respiratory distress. Abdominal: Soft.  There is no tenderness. Psychiatric: Patient has a normal mood and affect. behavior is normal. Judgment and thought content normal.    Diabetic Foot Exam: Diabetic Foot Exam - Simple   Simple Foot Form Diabetic Foot exam was performed with the following findings: Yes 04/11/2019  8:24 AM  Visual Inspection No deformities, no ulcerations, no other skin  breakdown bilaterally: Yes Sensation Testing Intact to touch and monofilament testing bilaterally: Yes Pulse Check Posterior Tibialis and Dorsalis pulse intact bilaterally: Yes Comments      PHQ2/9: Depression screen [facility] 2/9 04/11/2019 10/06/2018 07/19/2018 04/25/2018 04/07/2018  Decreased Interest 0 0 0 0 0  Down, Depressed, Hopeless 0 0 0 0 0  PHQ - 2 Score 0 0 0 0 0  Altered sleeping 0 0 - 0 0  Tired, decreased energy 0 0 - 0 0  Change in appetite 0 0 - 0 0  Feeling bad or failure about yourself  0 0 - 0 0  Trouble concentrating 0 0 - 0 0  Moving slowly or fidgety/restless 0 0 - 0 0  Suicidal thoughts 0 0 - 0 0  PHQ-9 Score 0 0 - 0 0  Difficult doing work/chores - - - Not difficult at all Not difficult at all    phq 9 is negative   Fall Risk: Fall Risk  04/11/2019 10/06/2018 07/19/2018 04/25/2018 04/07/2018  Falls in the past year? 0 0 0 No No  Number falls in past yr: 0 0 0 - -  Injury with Fall? 0 0 0 - -     Assessment & Plan  1. Type 2 diabetes mellitus with microalbuminuria, without long-term current use of insulin (HCC)  - POCT HgB A1C  2. Microalbuminuria  Recheck today   3. Dyslipidemia  Discussed a healthier diet and to consider keto diet , she is willing to try zetia   4. History of bariatric surgery  Recheck labs yearly   5. History of partial thyroidectomy  Recheck TSH  Yearly   6. Morbid obesity ([place])  Discussed with the patient the risk posed by an increased BMI. Discussed importance of portion control, calorie counting and at least 150 minutes of physical activity weekly. Avoid sweet beverages and drink more water. Eat at least 6 servings of fruit and vegetables daily   7. OSA (obstructive sleep apnea)  She is not compliant   8. Dyslipidemia associated with type 2 diabetes mellitus ([place])   9. Need for immunization against influenza  - Flu Vaccine  QUAD 36+ mos IM

## 2019-04-12 ENCOUNTER — Other Ambulatory Visit: Payer: Self-pay | Admitting: Family Medicine

## 2019-04-12 DIAGNOSIS — R809 Proteinuria, unspecified: Secondary | ICD-10-CM

## 2019-04-12 NOTE — Telephone Encounter (Signed)
Requested Prescriptions  Pending Prescriptions Disp Refills  . lisinopril (ZESTRIL) 5 MG tablet [Pharmacy Med Name: Lisinopril 5 MG Oral Tablet] 90 tablet 1    Sig: Take 1 tablet by mouth once daily     Cardiovascular:  ACE Inhibitors Failed - 04/12/2019  4:28 PM      Failed - Cr in normal range and within 180 days    Creat  Date Value Ref Range Status  10/06/2018 0.67 0.50 - 1.05 mg/dL Final    Comment:    For patients >52 years of age, the reference limit for Creatinine is approximately 13% higher for people identified as African-American. .          Failed - K in normal range and within 180 days    Potassium  Date Value Ref Range Status  10/06/2018 4.0 3.5 - 5.3 mmol/L Final         Passed - Patient is not pregnant      Passed - Last BP in normal range    BP Readings from Last 1 Encounters:  04/11/19 118/80         Passed - Valid encounter within last 6 months    Recent Outpatient Visits          Yesterday Type 2 diabetes mellitus with microalbuminuria, without long-term current use of insulin [facility])   [facility], Drue Stager, MD   6 months ago Type 2 diabetes mellitus with microalbuminuria, without long-term current use of insulin [facility])   [facility] Steele Sizer, MD   8 months ago Well woman exam   [facility] Steele Sizer, MD   11 months ago Cellulitis of face   [place], FNP   1 year ago Type 2 diabetes mellitus with microalbuminuria, without long-term current use of insulin [facility])   [facility] Steele Sizer, MD      Future Appointments            In 3 months Ancil Boozer, Drue Stager, MD [facility], [facility]

## 2019-04-17 ENCOUNTER — Encounter: Payer: Self-pay | Admitting: Family Medicine

## 2019-06-19 ENCOUNTER — Encounter: Payer: Self-pay | Admitting: Family Medicine

## 2019-06-21 ENCOUNTER — Other Ambulatory Visit: Payer: Self-pay

## 2019-06-21 ENCOUNTER — Encounter: Payer: Self-pay | Admitting: Family Medicine

## 2019-06-21 ENCOUNTER — Ambulatory Visit: Payer: No Typology Code available for payment source | Admitting: Family Medicine

## 2019-06-21 VITALS — BP 118/76 | HR 95 | Temp 97.1°F | Resp 16 | Ht 61.0 in | Wt 233.4 lb

## 2019-06-21 DIAGNOSIS — L0211 Cutaneous abscess of neck: Secondary | ICD-10-CM | POA: Diagnosis not present

## 2019-06-21 MED ORDER — DOXYCYCLINE HYCLATE 100 MG PO TABS: 100.0000 mg | ORAL_TABLET | Freq: Two times a day (BID) | ORAL | 0 refills | Status: DC

## 2019-06-21 NOTE — Progress Notes (Signed)
Name: Debra Contreras   MRN: [id number]    DOB: Jan 08, 1966   Date:06/21/2019       Progress Note  Subjective  Chief Complaint  Chief Complaint  Patient presents with  . Cyst    Onset- Saturday, a cyst started on her thyroid scar and has gotten red, painful and squishy. Has been putting hot compresses on it and has gone down a little.    HPI  Cyst on left thyroidectomy scar: she noticed pain, redness and swelling on left side of thyroidectomy scar four days ago, she tried warm compresses, and today is less red and not as painful. No fever, chills, change in appetite, nausea or vomiting.    Patient Active Problem List   Diagnosis Date Noted  . 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 History of partial thyroidectomy 10/05/2015  . H/O thyroid nodule 10/03/2015  . Morbid obesity ([place]) 06/04/2015  . Breast lump in female 05/17/2015  . Acanthosis nigricans 03/09/2015  . Allergic rhinitis, seasonal 03/09/2015  . Diabetes mellitus with renal manifestation ([place]) 03/09/2015  . Dyslipidemia 03/09/2015  . Gastro-esophageal reflux disease without esophagitis 03/09/2015  . Hiatal hernia 03/09/2015  . History of anemia 03/09/2015  . H/O: hysterectomy 03/09/2015  . Uterus, adenomyosis 03/09/2015  . Calculus of kidney 03/09/2015  . Benign hypertension 03/09/2015  . Extreme obesity 03/09/2015  . Vitamin D deficiency 03/09/2015  . Apnea, sleep 09/12/2014  . Microalbuminuria 05/29/2014    Past Surgical History:  Procedure Laterality Date  . ABDOMINAL HYSTERECTOMY  03/2012  . BREAST BIOPSY Right 12/2005   Normal-Cyst  . BREAST SURGERY Bilateral 1990   Reduction  . COLONOSCOPY WITH PROPOFOL N/A 05/07/2017   Procedure: COLONOSCOPY WITH PROPOFOL;  Surgeon: Jonathon Bellows, MD;  Location[facility] ENDOSCOPY;  Service: Gastroenterology;  Laterality: N/A;  . OOPHORECTOMY Left 03/2011  . REDUCTION MAMMAPLASTY Bilateral   . SLEEVE GASTROPLASTY  09/2016   [place]  . thyroidectomy Left 10/08/2015     Family History  Problem Relation Age of Onset  . Hypertension Mother   . Cirrhosis Mother   . COPD Mother        Heavy Smoker  . Hepatitis C Mother   . Alcohol abuse Father   . Heart disease Father   . Heart attack Father   . Diabetes Brother   . Obesity Brother   . Lung cancer Maternal Grandmother        Snuff  . Diabetes Maternal Grandmother   . Alcoholism Maternal Grandmother   . Prostate cancer Paternal Grandfather   . Alcoholism Maternal Grandfather   . Diabetes Maternal Grandfather   . Breast cancer Cousin     Social History   Socioeconomic History  . Marital status: Divorced    Spouse name: Not on file  . Number of children: 0  . Years of education: Not on file  . Highest education level: Bachelor's degree (e.g., BA, AB, BS)  Occupational History  . Occupation: Designer, jewellery   Social Needs  . Financial resource strain: Not hard at all  . Food insecurity    Worry: Never true    Inability: Never true  . Transportation needs    Medical: No    Non-medical: No  Tobacco Use  . Smoking status: Never Smoker  . Smokeless tobacco: Never Used  Substance and Sexual Activity  . Alcohol use: Yes    Alcohol/week: 0.0 standard drinks    Comment: wine-social drinker  . Drug use: No  . Sexual activity: Yes  Partners: Male  Lifestyle  . Physical activity    Days per week: 0 days    Minutes per session: 0 min  . Stress: Not at all  Relationships  . Social connections    Talks on phone: More than three times a week    Gets together: More than three times a week    Attends religious service: More than 4 times per year    Active member of club or organization: Yes    Attends meetings of clubs or organizations: More than 4 times per year    Relationship status: Divorced  . Intimate partner violence    Fear of current or ex partner: No    Emotionally abused: No    Physically abused: No    Forced sexual activity: No  Other Topics Concern  . Not on file   Social History Narrative   Lives alone   She is a Designer, jewellery.      Current Outpatient Medications:  .  Biotin 5000 MCG TABS, Take 5,000 mg by mouth 3 (three) times a week., Disp: , Rfl:  .  Cholecalciferol (VITAMIN D) 2000 units CAPS, Take 1 capsule (2,000 Units total) by mouth daily., Disp: 30 capsule, Rfl:  .  ezetimibe (ZETIA) 10 MG tablet, Take 1 tablet (10 mg total) by mouth daily., Disp: 90 tablet, Rfl: 3 .  Flaxseed, Linseed, (FLAXSEED OIL PO), Take by mouth., Disp: , Rfl:  .  fluticasone (FLONASE) 50 MCG/ACT nasal spray, Place 2 sprays into both nostrils daily., Disp: 16 g, Rfl: 6 .  lisinopril (ZESTRIL) 5 MG tablet, Take 1 tablet by mouth once daily, Disp: 90 tablet, Rfl: 1 .  loratadine (CLARITIN) 10 MG tablet, Take by mouth., Disp: , Rfl:  .  Multiple Vitamin (MULTIVITAMIN) tablet, Take 1 tablet by mouth daily., Disp: , Rfl:  .  Thiamine HCl (VITAMIN B-1 PO), Take 500 mg by mouth daily., Disp: , Rfl:  .  vitamin B-12 (CYANOCOBALAMIN) 1000 MCG tablet, Take 1,000 mcg by mouth 2 (two) times a week., Disp: , Rfl:  .  vitamin C (ASCORBIC ACID) 500 MG tablet, Take 500 mg by mouth daily., Disp: , Rfl:   Allergies  Allergen Reactions  . Zinc     Nausea, vomiting and diarrhea   . Latex Rash    I personally reviewed active problem list, medication list, allergies, family history, social history, health maintenance with the patient/caregiver today.   ROS  Ten systems reviewed and is negative except as mentioned in HPI  Objective  Vitals:   06/21/19 1154  BP: 118/76  Pulse: 95  Resp: 16  Temp: (!) 97.1 F (36.2 C)  TempSrc: Temporal  SpO2: 98%  Weight: 233 lb 6.4 oz (105.9 kg)  Height: 5\' 1"  (1.549 m)    Body mass index is 44.1 kg/m.  Physical Exam  Constitutional: Patient appears well-developed and well-nourished. Obese  No distress.  HEENT: head atraumatic, normocephalic, pupils equal and reactive to light Skin: 2.5 cm length and 1 cm in width, very mild  erythema and very mild tenderness Cardiovascular: Normal rate, regular rhythm and normal heart sounds.  No murmur heard. No BLE edema. Pulmonary/Chest: Effort normal and breath sounds normal. No respiratory distress. Abdominal: Soft.  There is no tenderness. Psychiatric: Patient has a normal mood and affect. behavior is normal. Judgment and thought content normal.  Recent Results (from the past 2160 hour(s))  Novel Coronavirus, NAA ([facility])     Status: None   Collection Time: 03/29/19  12:00 AM   Specimen: Oropharyngeal(OP) collection in vial transport medium   OROPHARYNGEA  SCREENIN  Result Value Ref Range   SARS-CoV-2, NAA Not Detected Not Detected    Comment: This test was developed and its performance characteristics determined by [employer]. This test has not been FDA cleared or approved. This test has been authorized by FDA under an Emergency Use Authorization (EUA). This test is only authorized for the duration of time the declaration that circumstances exist justifying the authorization of the emergency use of in vitro diagnostic tests for detection of SARS-CoV-2 virus and/or diagnosis of COVID-19 infection under section 564(b)(1) of the Act, 21 U.S.C. [id number]), unless the authorization is terminated or revoked sooner. When diagnostic testing is negative, the possibility of a false negative result should be considered in the context of a patient's recent exposures and the presence of clinical signs and symptoms consistent with COVID-19. An individual without symptoms of COVID-19 and who is not shedding SARS-CoV-2 virus would expect to have a negative (not detected) result in this assay.   Novel Coronavirus, NAA ([facility])     Status: None   Collection Time: 04/05/19  8:15 AM   Specimen: Oropharyngeal(OP) collection in vial transport medium   OROPHARYNGEA  SCREENIN  Result Value Ref Range   SARS-CoV-2, NAA Not Detected Not Detected    Comment: This nucleic  acid amplification test was developed and its performance characteristics determined by [employer]. Nucleic acid amplification tests include PCR and TMA. This test has not been FDA cleared or approved. This test has been authorized by FDA under an Emergency Use Authorization (EUA). This test is only authorized for the duration of time the declaration that circumstances exist justifying the authorization of the emergency use of in vitro diagnostic tests for detection of SARS-CoV-2 virus and/or diagnosis of COVID-19 infection under section 564(b)(1) of the Act, 21 U.S.C. [id number]) (1), unless the authorization is terminated or revoked sooner. When diagnostic testing is negative, the possibility of a false negative result should be considered in the context of a patient's recent exposures and the presence of clinical signs and symptoms consistent with COVID-19. An individual without symptoms of COVID-19 and who is not shedding SARS-CoV-2 virus would  expect to have a negative (not detected) result in this assay.   POCT HgB A1C     Status: Normal   Collection Time: 04/11/19  8:03 AM  Result Value Ref Range   Hemoglobin A1C 5.5 4.0 - 5.6 %   HbA1c POC (<> result, manual entry)     HbA1c, POC (prediabetic range)     HbA1c, POC (controlled diabetic range)    POCT UA - Microalbumin     Status: Normal   Collection Time: 04/11/19  9:23 AM  Result Value Ref Range   Microalbumin Ur, POC Negative mg/L   Creatinine, POC     Albumin/Creatinine Ratio, Urine, POC       PHQ2/9: Depression screen [facility] 2/9 06/21/2019 04/11/2019 10/06/2018 07/19/2018 04/25/2018  Decreased Interest 0 0 0 0 0  Down, Depressed, Hopeless 0 0 0 0 0  PHQ - 2 Score 0 0 0 0 0  Altered sleeping 2 0 0 - 0  Tired, decreased energy 2 0 0 - 0  Change in appetite 1 0 0 - 0  Feeling bad or failure about yourself  0 0 0 - 0  Trouble concentrating 0 0 0 - 0  Moving slowly or fidgety/restless 0 0 0 - 0  Suicidal  thoughts 0  0 0 - 0  PHQ-9 Score 5 0 0 - 0  Difficult doing work/chores Not difficult at all - - - Not difficult at all    phq 9 is negative   Fall Risk: Fall Risk  06/21/2019 04/11/2019 10/06/2018 07/19/2018 04/25/2018  Falls in the past year? 0 0 0 0 No  Number falls in past yr: 0 0 0 0 -  Injury with Fall? 0 0 0 0 -    Functional Status Survey: Is the patient deaf or have difficulty hearing?: No Does the patient have difficulty seeing, even when wearing glasses/contacts?: No Does the patient have difficulty concentrating, remembering, or making decisions?: No Does the patient have difficulty walking or climbing stairs?: No Does the patient have difficulty dressing or bathing?: No Does the patient have difficulty doing errands alone such as visiting a doctor's office or shopping?: No    Assessment & Plan  1. Abscess of skin of neck  - doxycycline (VIBRA-TABS) 100 MG tablet; Take 1 tablet (100 mg total) by mouth 2 (two) times daily.  Dispense: 20 tablet; Refill: 0  Advised warm compresses and call back for referral to surgeon if no resolution

## 2019-06-22 ENCOUNTER — Ambulatory Visit: Payer: Self-pay | Admitting: Family Medicine

## 2019-07-20 ENCOUNTER — Other Ambulatory Visit: Payer: Self-pay | Admitting: Family Medicine

## 2019-07-20 DIAGNOSIS — Z1231 Encounter for screening mammogram for malignant neoplasm of breast: Secondary | ICD-10-CM

## 2019-07-25 ENCOUNTER — Encounter: Payer: Self-pay | Admitting: Family Medicine

## 2019-07-25 ENCOUNTER — Ambulatory Visit (INDEPENDENT_AMBULATORY_CARE_PROVIDER_SITE_OTHER): Payer: No Typology Code available for payment source | Admitting: Family Medicine

## 2019-07-25 ENCOUNTER — Other Ambulatory Visit: Payer: Self-pay

## 2019-07-25 VITALS — BP 140/90 | HR 83 | Temp 97.1°F | Resp 16 | Ht 60.0 in | Wt 234.2 lb

## 2019-07-25 DIAGNOSIS — Z1211 Encounter for screening for malignant neoplasm of colon: Secondary | ICD-10-CM

## 2019-07-25 DIAGNOSIS — M25519 Pain in unspecified shoulder: Secondary | ICD-10-CM

## 2019-07-25 DIAGNOSIS — R030 Elevated blood-pressure reading, without diagnosis of hypertension: Secondary | ICD-10-CM

## 2019-07-25 DIAGNOSIS — Z1159 Encounter for screening for other viral diseases: Secondary | ICD-10-CM | POA: Diagnosis not present

## 2019-07-25 DIAGNOSIS — E785 Hyperlipidemia, unspecified: Secondary | ICD-10-CM

## 2019-07-25 DIAGNOSIS — Z0000 Encounter for general adult medical examination without abnormal findings: Secondary | ICD-10-CM | POA: Diagnosis not present

## 2019-07-25 MED ORDER — PREDNISONE 10 MG PO TABS: 10.0000 mg | ORAL_TABLET | Freq: Three times a day (TID) | ORAL | 0 refills | Status: DC

## 2019-07-25 NOTE — Progress Notes (Signed)
Name: Debra Contreras   MRN: [id number]    DOB: 10-11-1965   Date:07/25/2019       Progress Note  Subjective  Chief Complaint  Chief Complaint  Patient presents with  . Annual Exam    HPI  Patient presents for annual CPE   Left shoulder pain: she states she woke up a few months ago with left shoulder pain, she has decrease rom of left shoulder, worse with abduction, pain is worse on left anterior shoulder. Pain is described as sharp, tender to touch. Sometimes radiates towards her neck but never down her left arm.   COVID-19 vaccine questions: discussed mRNA and also the [place] vaccine, she states worried about the history of the way african americans were treated in the past in terms of research. She states she has been educating herself and is opened to try it.   Elevated bp: she has a history of hypertension, no chest pain or palpitation   Diet: discussed importance of avoiding salt. Her diet has been poor, working long hours, eating on the go  Exercise: not current   USPSTF grade A and B recommendations    Office Visit from 07/25/2019 in [facility]  AUDIT-C Score  0     Depression: Phq 9 is  negative Depression screen [facility] 2/9 07/25/2019 06/21/2019 04/11/2019 10/06/2018 07/19/2018  Decreased Interest 0 0 0 0 0  Down, Depressed, Hopeless 0 0 0 0 0  PHQ - 2 Score 0 0 0 0 0  Altered sleeping 0 2 0 0 -  Tired, decreased energy 0 2 0 0 -  Change in appetite 0 1 0 0 -  Feeling bad or failure about yourself  0 0 0 0 -  Trouble concentrating 0 0 0 0 -  Moving slowly or fidgety/restless 0 0 0 0 -  Suicidal thoughts 0 0 0 0 -  PHQ-9 Score 0 5 0 0 -  Difficult doing work/chores - Not difficult at all - - -   Hypertension: BP Readings from Last 3 Encounters:  07/25/19 140/90  06/21/19 118/76  04/11/19 118/80   Obesity: Wt Readings from Last 3 Encounters:  07/25/19 234 lb 3.2 oz (106.2 kg)  06/21/19 233 lb 6.4 oz (105.9 kg)  04/11/19 224 lb 6.4 oz  (101.8 kg)   BMI Readings from Last 3 Encounters:  07/25/19 45.74 kg/m  06/21/19 44.10 kg/m  04/11/19 42.40 kg/m     Hep C Screening: today  STD testing and prevention (HIV/chl/gon/syphilis): not interested  Intimate partner violence:negative screen  Sexual History (Partners/Practices/Protection from [employer] hx STI/Pregnancy Plans):she has not been sexually active in years  Menstrual History/LMP/Abnormal Bleeding: s/p hysterectomy for DUB  Incontinence Symptoms: nocturia / she states she drinks a lot water all day and before bed   Breast cancer:  - Last Mammogram: 07/2018 - BRCA gene screening: N/A  Osteoporosis: Discussed high calcium and vitamin D supplementation, weight bearing exercises  Cervical cancer screening: s/p hysterectomy   Skin cancer: Discussed monitoring for atypical lesions  Colorectal cancer: due for repeat  Lung cancer:  Low Dose CT Chest recommended if Age 52-80 years, 30 pack-year currently smoking OR have quit w/in 15years. Patient does not qualify.   ECG: 2014   Advanced Care Planning: A voluntary discussion about advance care planning including the explanation and discussion of advance directives.  Discussed health care proxy and Living will, and the patient was able to identify a health care proxy mother   Patient does not  have a living will at present time.   Lipids: Lab Results  Component Value Date   CHOL 273 (H) 10/06/2018   CHOL 205 (H) 10/22/2017   CHOL 225 (H) 03/18/2017   Lab Results  Component Value Date   HDL 53 10/06/2018   HDL 54 10/22/2017   HDL 47 (L) 03/18/2017   Lab Results  Component Value Date   LDLCALC 179 (H) 10/06/2018   LDLCALC 132 (H) 10/22/2017   LDLCALC 152 (H) 03/18/2017   Lab Results  Component Value Date   TRIG 215 (H) 10/06/2018   TRIG 90 10/22/2017   TRIG 129 03/18/2017   Lab Results  Component Value Date   CHOLHDL 5.2 (H) 10/06/2018   CHOLHDL 3.8 10/22/2017   CHOLHDL 4.8 03/18/2017   No results  found for: LDLDIRECT  Glucose: Glucose, Bld  Date Value Ref Range Status  10/06/2018 81 65 - 99 mg/dL Final    Comment:    .            Fasting reference interval .   04/25/2018 95 70 - 99 mg/dL Final  04/07/2018 78 65 - 99 mg/dL Final    Comment:    .            Fasting reference interval .     Patient Active Problem List   Diagnosis Date Noted  . 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 History of partial thyroidectomy 10/05/2015  . H/O thyroid nodule 10/03/2015  . Morbid obesity ([place]) 06/04/2015  . Breast lump in female 05/17/2015  . Acanthosis nigricans 03/09/2015  . Allergic rhinitis, seasonal 03/09/2015  . Diabetes mellitus with renal manifestation ([place]) 03/09/2015  . Dyslipidemia 03/09/2015  . Gastro-esophageal reflux disease without esophagitis 03/09/2015  . Hiatal hernia 03/09/2015  . History of anemia 03/09/2015  . H/O: hysterectomy 03/09/2015  . Uterus, adenomyosis 03/09/2015  . Calculus of kidney 03/09/2015  . Benign hypertension 03/09/2015  . Extreme obesity 03/09/2015  . Vitamin D deficiency 03/09/2015  . Apnea, sleep 09/12/2014  . Microalbuminuria 05/29/2014    Past Surgical History:  Procedure Laterality Date  . ABDOMINAL HYSTERECTOMY  03/2012  . BREAST BIOPSY Right 12/2005   Normal-Cyst  . BREAST SURGERY Bilateral 1990   Reduction  . COLONOSCOPY WITH PROPOFOL N/A 05/07/2017   Procedure: COLONOSCOPY WITH PROPOFOL;  Surgeon: Jonathon Bellows, MD;  Location: [facility] ENDOSCOPY;  Service: Gastroenterology;  Laterality: N/A;  . OOPHORECTOMY Left 03/2011  . REDUCTION MAMMAPLASTY Bilateral   . SLEEVE GASTROPLASTY  09/2016   [place]  . thyroidectomy Left 10/08/2015    Family History  Problem Relation Age of Onset  . Hypertension Mother   . Cirrhosis Mother   . COPD Mother        Heavy Smoker  . Hepatitis C Mother   . Alcohol abuse Father   . Heart disease Father   . Heart attack Father   . Diabetes Brother   . Obesity Brother   . Lung cancer  Maternal Grandmother        Snuff  . Diabetes Maternal Grandmother   . Alcoholism Maternal Grandmother   . Prostate cancer Paternal Grandfather   . Alcoholism Maternal Grandfather   . Diabetes Maternal Grandfather   . Breast cancer Cousin     Social History   Socioeconomic History  . Marital status: Divorced    Spouse name: Not on file  . Number of children: 0  . Years of education: Not on file  . Highest education level: Bachelor's  degree (e.g., BA, AB, BS)  Occupational History  . Occupation: Designer, jewellery   Tobacco Use  . Smoking status: Never Smoker  . Smokeless tobacco: Never Used  Substance and Sexual Activity  . Alcohol use: Yes    Alcohol/week: 0.0 standard drinks    Comment: wine-social drinker  . Drug use: No  . Sexual activity: Yes    Partners: Male  Other Topics Concern  . Not on file  Social History Narrative   Lives alone   She is a Designer, jewellery.    Social Determinants of Health   Financial Resource Strain: Low Risk   . Difficulty of Paying Living Expenses: Not hard at all  Food Insecurity: No Food Insecurity  . Worried About Charity fundraiser in the Last Year: Never true  . Ran Out of Food in the Last Year: Never true  Transportation Needs: No Transportation Needs  . Lack of Transportation (Medical): No  . Lack of Transportation (Non-Medical): No  Physical Activity: Inactive  . Days of Exercise per Week: 0 days  . Minutes of Exercise per Session: 0 min  Stress: No Stress Concern Present  . Feeling of Stress : Only a little  Social Connections: Slightly Isolated  . Frequency of Communication with Friends and Family: More than three times a week  . Frequency of Social Gatherings with Friends and Family: Twice a week  . Attends Religious Services: More than 4 times per year  . Active Member of Clubs or Organizations: Yes  . Attends Archivist Meetings: More than 4 times per year  . Marital Status: Divorced  Human resources officer  Violence: Not At Risk  . Fear of Current or Ex-Partner: No  . Emotionally Abused: No  . Physically Abused: No  . Sexually Abused: No     Current Outpatient Medications:  .  Biotin 5000 MCG TABS, Take 5,000 mg by mouth 3 (three) times a week., Disp: , Rfl:  .  Cholecalciferol (VITAMIN D) 2000 units CAPS, Take 1 capsule (2,000 Units total) by mouth daily., Disp: 30 capsule, Rfl:  .  doxycycline (VIBRA-TABS) 100 MG tablet, Take 1 tablet (100 mg total) by mouth 2 (two) times daily., Disp: 20 tablet, Rfl: 0 .  ezetimibe (ZETIA) 10 MG tablet, Take 1 tablet (10 mg total) by mouth daily., Disp: 90 tablet, Rfl: 3 .  Flaxseed, Linseed, (FLAXSEED OIL PO), Take by mouth., Disp: , Rfl:  .  fluticasone (FLONASE) 50 MCG/ACT nasal spray, Place 2 sprays into both nostrils daily., Disp: 16 g, Rfl: 6 .  lisinopril (ZESTRIL) 5 MG tablet, Take 1 tablet by mouth once daily, Disp: 90 tablet, Rfl: 1 .  loratadine (CLARITIN) 10 MG tablet, Take by mouth., Disp: , Rfl:  .  Multiple Vitamin (MULTIVITAMIN) tablet, Take 1 tablet by mouth daily., Disp: , Rfl:  .  Thiamine HCl (VITAMIN B-1 PO), Take 500 mg by mouth daily., Disp: , Rfl:  .  vitamin B-12 (CYANOCOBALAMIN) 1000 MCG tablet, Take 1,000 mcg by mouth 2 (two) times a week., Disp: , Rfl:  .  vitamin C (ASCORBIC ACID) 500 MG tablet, Take 500 mg by mouth daily., Disp: , Rfl:  .  predniSONE (DELTASONE) 10 MG tablet, Take 1 tablet (10 mg total) by mouth 3 (three) times daily before meals., Disp: 15 tablet, Rfl: 0  Allergies  Allergen Reactions  . Zinc     Nausea, vomiting and diarrhea   . Latex Rash     ROS  Constitutional: Negative for  fever or weight change.  Respiratory: Negative for cough and shortness of breath.   Cardiovascular: Negative for chest pain or palpitations.  Gastrointestinal: Negative for abdominal pain, no bowel changes.  Musculoskeletal: Negative for gait problem or joint swelling.  Skin: Negative for rash.  Neurological: Negative for  dizziness or headache.  No other specific complaints in a complete review of systems (except as listed in HPI above).  Objective  Vitals:   07/25/19 0805  BP: 140/90  Pulse: 83  Resp: 16  Temp: (!) 97.1 F (36.2 C)  TempSrc: Temporal  SpO2: 99%  Weight: 234 lb 3.2 oz (106.2 kg)  Height: 5' (1.524 m)    Body mass index is 45.74 kg/m.  Physical Exam  Constitutional: Patient appears well-developed and obese . No distress.  HENT: Head: Normocephalic and atraumatic. Ears: B TMs ok, no erythema or effusion; Nose and oral exam not done  Eyes: Conjunctivae and EOM are normal. Pupils are equal, round, and reactive to light. No scleral icterus.  Neck: Normal range of motion. Neck supple. No JVD present. No thyromegaly present.  Cardiovascular: Normal rate, regular rhythm and normal heart sounds.  No murmur heard. No BLE edema. Pulmonary/Chest: Effort normal and breath sounds normal. No respiratory distress. Abdominal: Soft. Bowel sounds are normal, no distension. There is no tenderness. no masses Breast: no lumps or masses, no nipple discharge or rashes FEMALE GENITALIA:  Not done RECTAL: not done Musculoskeletal: Left shoulder has decrease rom, impingement sign Neurological: he is alert and oriented to person, place, and time. No cranial nerve deficit. Coordination, balance, strength, speech and gait are normal.  Skin: Skin is warm and dry. No rash noted. No erythema.  Psychiatric: Patient has a normal mood and affect. behavior is normal. Judgment and thought content normal.    Fall Risk: Fall Risk  07/25/2019 06/21/2019 04/11/2019 10/06/2018 07/19/2018  Falls in the past year? 0 0 0 0 0  Number falls in past yr: 0 0 0 0 0  Injury with Fall? 0 0 0 0 0     Functional Status Survey: Is the patient deaf or have difficulty hearing?: No Does the patient have difficulty seeing, even when wearing glasses/contacts?: No Does the patient have difficulty concentrating, remembering, or  making decisions?: No Does the patient have difficulty walking or climbing stairs?: No Does the patient have difficulty dressing or bathing?: No   Assessment & Plan  1. Well adult exam  - COMPLETE METABOLIC PANEL WITH GFR - Lipid panel  2. Need for hepatitis C screening test  She states she had it in the past  3. Dyslipidemia  - Lipid panel  4. Colon cancer screening  - Ambulatory referral to Gastroenterology  5. Elevated blood pressure reading  She will have it rechecked at work   6. Anterior shoulder pain  - predniSONE (DELTASONE) 10 MG tablet; Take 1 tablet (10 mg total) by mouth 3 (three) times daily before meals.  Dispense: 15 tablet; Refill: 0  -USPSTF grade A and B recommendations reviewed with patient; age-appropriate recommendations, preventive care, screening tests, etc discussed and encouraged; healthy living encouraged; see AVS for patient education given to patient -Discussed importance of 150 minutes of physical activity weekly, eat two servings of fish weekly, eat one serving of tree nuts ( cashews, pistachios, pecans, almonds.Marland Kitchen) every other day, eat 6 servings of fruit/vegetables daily

## 2019-07-25 NOTE — Patient Instructions (Addendum)
Preventive Care 40-52 Years Old, Female Preventive care refers to visits with your health care provider and lifestyle choices that can promote health and wellness. This includes:  A yearly physical exam. This may also be called an annual well check.  Regular dental visits and eye exams.  Immunizations.  Screening for certain conditions.  Healthy lifestyle choices, such as eating a healthy diet, getting regular exercise, not using drugs or products that contain nicotine and tobacco, and limiting alcohol use. What can I expect for my preventive care visit? Physical exam Your health care provider will check your:  Height and weight. This may be used to calculate body mass index (BMI), which tells if you are at a healthy weight.  Heart rate and blood pressure.  Skin for abnormal spots. Counseling Your health care provider may ask you questions about your:  Alcohol, tobacco, and drug use.  Emotional well-being.  Home and relationship well-being.  Sexual activity.  Eating habits.  Work and work environment.  Method of birth control.  Menstrual cycle.  Pregnancy history. What immunizations do I need?  Influenza (flu) vaccine  This is recommended every year. Tetanus, diphtheria, and pertussis (Tdap) vaccine  You may need a Td booster every 10 years. Varicella (chickenpox) vaccine  You may need this if you have not been vaccinated. Zoster (shingles) vaccine  You may need this after age 60. Measles, mumps, and rubella (MMR) vaccine  You may need at least one dose of MMR if you were born in 1957 or later. You may also need a second dose. Pneumococcal conjugate (PCV13) vaccine  You may need this if you have certain conditions and were not previously vaccinated. Pneumococcal polysaccharide (PPSV23) vaccine  You may need one or two doses if you smoke cigarettes or if you have certain conditions. Meningococcal conjugate (MenACWY) vaccine  You may need this if you  have certain conditions. Hepatitis A vaccine  You may need this if you have certain conditions or if you travel or work in places where you may be exposed to hepatitis A. Hepatitis B vaccine  You may need this if you have certain conditions or if you travel or work in places where you may be exposed to hepatitis B. Haemophilus influenzae type b (Hib) vaccine  You may need this if you have certain conditions. Human papillomavirus (HPV) vaccine  If recommended by your health care provider, you may need three doses over 6 months. You may receive vaccines as individual doses or as more than one vaccine together in one shot (combination vaccines). Talk with your health care provider about the risks and benefits of combination vaccines. What tests do I need? Blood tests  Lipid and cholesterol levels. These may be checked every 5 years, or more frequently if you are over 50 years old.  Hepatitis C test.  Hepatitis B test. Screening  Lung cancer screening. You may have this screening every year starting at age 55 if you have a 30-pack-year history of smoking and currently smoke or have quit within the past 15 years.  Colorectal cancer screening. All adults should have this screening starting at age 50 and continuing until age 75. Your health care provider may recommend screening at age 45 if you are at increased risk. You will have tests every 1-10 years, depending on your results and the type of screening test.  Diabetes screening. This is done by checking your blood sugar (glucose) after you have not eaten for a while (fasting). You may have this   done every 1-3 years.  Mammogram. This may be done every 1-2 years. Talk with your health care provider about when you should start having regular mammograms. This may depend on whether you have a family history of breast cancer.  BRCA-related cancer screening. This may be done if you have a family history of breast, ovarian, tubal, or peritoneal  cancers.  Pelvic exam and Pap test. This may be done every 3 years starting at age 69. Starting at age 57, this may be done every 5 years if you have a Pap test in combination with an HPV test. Other tests  Sexually transmitted disease (STD) testing.  Bone density scan. This is done to screen for osteoporosis. You may have this scan if you are at high risk for osteoporosis. Follow these instructions at home: Eating and drinking  Eat a diet that includes fresh fruits and vegetables, whole grains, lean protein, and low-fat dairy.  Take vitamin and mineral supplements as recommended by your health care provider.  Do not drink alcohol if: ? Your health care provider tells you not to drink. ? You are pregnant, may be pregnant, or are planning to become pregnant.  If you drink alcohol: ? Limit how much you have to 0-1 drink a day. ? Be aware of how much alcohol is in your drink. In the [place] one drink equals one 12 oz bottle of beer (355 mL), one 5 oz glass of wine (148 mL), or one 1 oz glass of hard liquor (44 mL). Lifestyle  Take daily care of your teeth and gums.  Stay active. Exercise for at least 30 minutes on 5 or more days each week.  Do not use any products that contain nicotine or tobacco, such as cigarettes, e-cigarettes, and chewing tobacco. If you need help quitting, ask your health care provider.  If you are sexually active, practice safe sex. Use a condom or other form of birth control (contraception) in order to prevent pregnancy and STIs (sexually transmitted infections).  If told by your health care provider, take low-dose aspirin daily starting at age 42. What's next?  Visit your health care provider once a year for a well check visit.  Ask your health care provider how often you should have your eyes and teeth checked.  Stay up to date on all vaccines. This information is not intended to replace advice given to you by your health care provider. Make sure you  discuss any questions you have with your health care provider. Document Released: 08/16/2015 Document Revised: 03/31/2018 Document Reviewed: 03/31/2018 Elsevier Patient Education  [place] After Surgery Ask your health care provider which exercises are safe for you. Do exercises exactly as told by your health care provider and adjust them as directed. It is normal to feel mild stretching, pulling, tightness, or discomfort as you do these exercises. Stop right away if you feel sudden pain or your pain gets worse. Do not begin these exercises until told by your health care provider. Stretching and range-of-motion exercises These exercises warm up your muscles and joints and improve the movement and flexibility of your shoulder. The exercises also help to relieve pain. Pendulum In this exercise, you let the injured arm dangle toward the floor and then swing it like a clock pendulum. 1. Stand near a wall or a surface that you can hold onto for balance. 2. Bend at the waist and let your left / right arm hang straight down. Use your other  arm to keep your balance. 3. Relax your arm and shoulder muscles, and move your hips and your trunk so your left / right arm swings freely. Your arm should swing because of the motion of your body, not because you are using your arm or shoulder muscles. 4. Keep moving your hips and trunk so your arm swings in the following directions, as told by your health care provider: ? Side to side. ? Forward and backward. ? In clockwise and counterclockwise circles. 5. Slowly return to the starting position. Repeat __________ times. Complete this exercise __________ times a day. Shoulder flexion, standing In this exercise, you raise your arm in front of your body (flexion) until you feel a stretch in your injured shoulder. 1. Stand and hold a broomstick, a cane, or a similar object with your hands. Place your hands a little more  than shoulder width apart on the object. Your left / right hand should be palm-up, and your other hand should be palm-down. 2. Push the stick up with your healthy arm to raise your left / right arm in front of your body, and then over your head. Use your other hand to help move the stick. Stop when you feel a stretch in your shoulder, or when you reach the angle that is recommended by your health care provider. ? Avoid shrugging your shoulder while you raise your arm. Keep your shoulder blade tucked down toward your spine. 3. Hold for __________ seconds. 4. Slowly return to the starting position. Repeat __________ times. Complete this exercise __________ times a day. Shoulder flexion, seated In this exercise, you raise your arm in front of your body until you feel a stretch in your injured shoulder. 1. Sit in a stable chair and rest your left / right forearm on a flat surface. Your elbow should rest at a height that keeps your upper arm next to your body. 2. Keeping your left / right shoulder relaxed, lean forward at the waist and let your hand slide forward (flexion). Stop when you feel a stretch in your shoulder, or when you reach the angle that is recommended by your health care provider. 3. Hold for __________ seconds. 4. Slowly return to the starting position. Repeat __________ times. Complete this exercise __________ times a day. Strengthening exercises These exercises build strength and endurance in your shoulder. Endurance is the ability to use your muscles for a long time, even after they get tired. Shoulder abduction, isometric In this exercise, you put pressure on your shoulder without moving your shoulder joint (isometric). 1. Stand or sit about 4-6 inches (10-15 cm) away from a wall with your right / left side facing the wall. 2. Bend your left / right elbow and gently press your elbow against the wall as if you are trying to move your arm out to your side (abduction). Gradually  increase the pressure until you are pressing as hard as you can without shrugging your shoulder and without feeling any pain. 3. Hold for __________ seconds. 4. Slowly release the tension and relax your muscles completely before you repeat the exercise. Repeat __________ times. Complete this exercise __________ times a day. Internal rotation, isometric This is an exercise in which you press your palm against a door frame without moving your shoulder joint (isometric). 1. Stand or sit in a doorway, facing the door frame. 2. Bend your left / right elbow and place the palm of your hand against the door frame. Only your palm should be touching the  frame. Keep your upper arm at your side. 3. Gently press your hand against the door frame as if you are trying to push your arm toward your abdomen (internal rotation). Gradually increase the pressure until you are pressing as hard as you can. Stop increasing the pressure if you feel shoulder pain. ? Avoid shrugging your shoulder while you press your hand into the door frame. Keep your shoulder blade tucked down toward the middle of your back. 4. Hold for __________ seconds. 5. Slowly release the tension, and relax your muscles completely before you repeat the exercise. Repeat __________ times. Complete this exercise __________ times a day. External rotation, isometric This is an exercise in which you press the back of your wrist against a door frame without moving your shoulder joint (isometric). 1. Stand or sit in a doorway, facing the door frame. 2. Bend your left / right elbow and place the back of your wrist against the door frame. Only the back of your wrist should be touching the frame. Keep your upper arm at your side. 3. Gently press your wrist against the door frame, as if you are trying to push your arm away from your abdomen (external rotation). Gradually increase the pressure until you are pressing as hard as you can. Stop increasing the pressure  if you feel pain. ? Avoid shrugging your shoulder while you press your wrist into the door frame. Keep your shoulder blade tucked down toward the middle of your back. 4. Hold for __________ seconds. 5. Slowly release the tension, and relax your muscles completely before you repeat the exercise. Repeat __________ times. Complete this exercise __________ times a day. Internal rotation, isotonic This is an exercise in which you use an exercise band and gently rotate your forearm toward your body (internal rotation) while moving your shoulder joint (isotonic). 1. Sit in a stable chair without armrests, or stand up. 2. Secure an exercise band at elbow height at your left / right side. 3. Place a soft object, such as a folded towel or a small pillow, between your left / right upper arm and your body to move your elbow about 4 inches (10 cm) away from your side. 4. Hold the end of the exercise band so it stretches with slight tension. 5. Keeping your elbow pressed against the soft object, slowly move your forearm in, toward your abdomen (internal rotation). Keep your body steady so the movement is only coming from your shoulder. 6. Hold for __________ seconds. 7. Slowly return to the starting position. Repeat __________ times. Complete this exercise __________ times a day. External rotation, isotonic This is an exercise in which you use an exercise band and gently rotate your forearm away from your body (external rotation) while moving your shoulder joint (isotonic). 1. Sit in a stable chair without armrests, or stand up. 2. Secure an exercise band at elbow height on your left / right side. 3. Place a soft object, such as a folded towel or a small pillow, between your left / right upper arm and your body to move your elbow about 4 inches (10 cm) away from your side. 4. Hold the end of the exercise band so it stretches with slight tension. 5. Keeping your elbow pressed against the soft object, slowly  move your forearm out, away from your abdomen (external rotation). Keep your body steady so the movement is only coming from your shoulder. 6. Hold for __________ seconds. 7. Slowly return to the starting position. Repeat __________ times.  Complete this exercise __________ times a day. This information is not intended to replace advice given to you by your health care provider. Make sure you discuss any questions you have with your health care provider. Document Released: 02/18/2005 Document Revised: 05/19/2018 Document Reviewed: 05/19/2018 Elsevier Patient Education  2020 [employer].

## 2019-07-26 LAB — COMPLETE METABOLIC PANEL WITH GFR
AG Ratio: 1.7 (calc) (ref 1.0–2.5)
ALT: 22 U/L (ref 6–29)
AST: 18 U/L (ref 10–35)
Albumin: 4.6 g/dL (ref 3.6–5.1)
Alkaline phosphatase (APISO): 55 U/L (ref 37–153)
BUN: 13 mg/dL (ref 7–25)
CO2: 25 mmol/L (ref 20–32)
Calcium: 9.7 mg/dL (ref 8.6–10.4)
Chloride: 102 mmol/L (ref 98–110)
Creat: 0.6 mg/dL (ref 0.50–1.05)
GFR, Est African American: 121 mL/min/{1.73_m2} (ref >=60)
GFR, Est Non African American: 105 mL/min/{1.73_m2} (ref >=60)
Globulin: 2.7 g/dL (calc) (ref 1.9–3.7)
Glucose, Bld: 89 mg/dL (ref 65–99)
Potassium: 4.1 mmol/L (ref 3.5–5.3)
Sodium: 139 mmol/L (ref 135–146)
Total Bilirubin: 0.4 mg/dL (ref 0.2–1.2)
Total Protein: 7.3 g/dL (ref 6.1–8.1)

## 2019-07-26 LAB — LIPID PANEL
Cholesterol: 275 mg/dL — ABNORMAL HIGH (ref <200)
HDL: 47 mg/dL — ABNORMAL LOW (ref >=50)
LDL Cholesterol (Calc): 169 mg/dL (calc) — ABNORMAL HIGH
Non-HDL Cholesterol (Calc): 228 mg/dL (calc) — ABNORMAL HIGH (ref <130)
Total CHOL/HDL Ratio: 5.9 (calc) — ABNORMAL HIGH (ref <5.0)
Triglycerides: 347 mg/dL — ABNORMAL HIGH (ref <150)

## 2019-08-01 ENCOUNTER — Ambulatory Visit
Admission: RE | Admit: 2019-08-01 | Discharge: 2019-08-01 | Disposition: A | Discharge status: Home / Self Care | Payer: PRIVATE HEALTH INSURANCE | Source: Ambulatory Visit | Attending: Family Medicine | Admitting: Family Medicine

## 2019-08-01 DIAGNOSIS — Z1231 Encounter for screening mammogram for malignant neoplasm of breast: Secondary | ICD-10-CM | POA: Insufficient documentation

## 2019-08-02 ENCOUNTER — Telehealth: Payer: Self-pay

## 2019-08-02 NOTE — Telephone Encounter (Signed)
Returned patients call to schedule her for a colonoscopy.  During triaging she stated she had colon polyps.  Reviewed Dr. Georgeann Oppenheim 05/07/17 Colonoscopy report.  It is documented that she had a "62mm non bleeding polyp", and a "71mm polyp found in the sigmoid colon".  Patient asked if this will change her colonoscopy from screening to diagnostic.  I replied yes it will be entered as a history of colon polyps based on the fact that polyps were present in 2018. She said she did not want to schedule her colonoscopy since it would not be coded as a preventive screening colonoscopy.  Thanks [employer]

## 2019-08-12 LAB — HM DIABETES EYE EXAM

## 2019-09-07 ENCOUNTER — Encounter: Payer: Self-pay | Admitting: Family Medicine

## 2019-10-25 ENCOUNTER — Other Ambulatory Visit: Payer: Self-pay

## 2019-10-25 ENCOUNTER — Ambulatory Visit: Payer: No Typology Code available for payment source | Admitting: Family Medicine

## 2019-10-25 ENCOUNTER — Encounter: Payer: Self-pay | Admitting: Family Medicine

## 2019-10-25 VITALS — BP 138/90 | HR 79 | Temp 97.8°F | Resp 16 | Ht 61.0 in | Wt 242.4 lb

## 2019-10-25 DIAGNOSIS — E1129 Type 2 diabetes mellitus with other diabetic kidney complication: Secondary | ICD-10-CM

## 2019-10-25 DIAGNOSIS — E1169 Type 2 diabetes mellitus with other specified complication: Secondary | ICD-10-CM

## 2019-10-25 DIAGNOSIS — R809 Proteinuria, unspecified: Secondary | ICD-10-CM

## 2019-10-25 DIAGNOSIS — E559 Vitamin D deficiency, unspecified: Secondary | ICD-10-CM

## 2019-10-25 DIAGNOSIS — G4733 Obstructive sleep apnea (adult) (pediatric): Secondary | ICD-10-CM

## 2019-10-25 DIAGNOSIS — Z9884 Bariatric surgery status: Secondary | ICD-10-CM | POA: Diagnosis not present

## 2019-10-25 DIAGNOSIS — E785 Hyperlipidemia, unspecified: Secondary | ICD-10-CM

## 2019-10-25 DIAGNOSIS — E890 Postprocedural hypothyroidism: Secondary | ICD-10-CM

## 2019-10-25 DIAGNOSIS — Z9009 Acquired absence of other part of head and neck: Secondary | ICD-10-CM

## 2019-10-25 DIAGNOSIS — I10 Essential (primary) hypertension: Secondary | ICD-10-CM

## 2019-10-25 DIAGNOSIS — M25519 Pain in unspecified shoulder: Secondary | ICD-10-CM

## 2019-10-25 DIAGNOSIS — E519 Thiamine deficiency, unspecified: Secondary | ICD-10-CM

## 2019-10-25 DIAGNOSIS — E538 Deficiency of other specified B group vitamins: Secondary | ICD-10-CM

## 2019-10-25 LAB — POCT GLYCOSYLATED HEMOGLOBIN (HGB A1C): Hemoglobin A1C: 5.6 % (ref 4.0–5.6)

## 2019-10-25 MED ORDER — ROSUVASTATIN CALCIUM 5 MG PO TABS: 5.0000 mg | ORAL_TABLET | Freq: Every day | ORAL | 0 refills | Status: DC

## 2019-10-25 MED ORDER — LISINOPRIL 10 MG PO TABS: 10.0000 mg | ORAL_TABLET | Freq: Every day | ORAL | 0 refills | Status: DC

## 2019-10-25 NOTE — Progress Notes (Signed)
Name: Debra Contreras   MRN: [id number]    DOB: 1965-09-09   Date:10/25/2019       Progress Note  Subjective  Chief Complaint  Chief Complaint  Patient presents with  . Diabetes  . Hypertension  . Gastroesophageal Reflux  . Dyslipidemia    HPI  Thyroid nodule: she was sitting on her desk working on a report and felt a large nodule on the left side back in October 2016, she had a partial thyroidectomy in March 2017, and is doing well since. Last TSH was at goal; no skin/hair/nail changes,  no heart palpitations,denies constipation.  DMII with renal manifestationand dyslipidemia: she is doing well, no longer taking Metforminbut still takes Lisinopril 5mg  for proteinuria, urine micro was elevated in the past at50, last level was normal and we will recheck it todayShe is not checking glucose at home. Eye exam is up to date. 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 Weight went down to 186 lbs but is up to 242 lbs again. She states while taking care of her mother she kept junk food at home but will try to avoid that again  . HTN:bp was high when she came in but improved with rest, she  denies chest pain or palpitation . We will increase dose of lisinopril from 5 mg to 10 mg daily   Dyslipidemia: last lipid panel showed significant increase of triglycerides, drop of HDL and LDL above 160 she was taking Zetia but stopped since the level did not improve. Discussed statin therapy again and she is willing to try 5 mg Crestor and recheck in 3 months   Hot flashes: she has a hysterectomy without BSO back in 2013 and was having severe symptoms in 22019 but not as severe now.  Only using Flexseed oil.  OSA: not  using CPAP machine because she can't afford it.Symptoms improved with weight loss, but explained that she has gained weight back, she wakes up feeling tired but not sure if secondary to shoulder pain.   Left shoulder pain: she states she woke up about 6  months ago with left shoulder pain, she has decrease rom of left shoulder, worse with abduction, pain is worse on left anterior shoulder. Pain is described as sharp, tender to touch. Sometimes radiates towards her neck but never down her left arm. We gave her prednisone orally back in Dec, it improved symptoms initially but it got progressively worse . She contacted me by e-mail in Dec and I advised her to to go Urgent Care but she never got my reply. She states pain is spreading and radiating to her neck also causing decrease in rom. Explained that is likely tendinitis or rotator cuff strain and recommend evaluation by Ortho    Stress: her mother broke her leg January 1 st, 2021 so she had to bring her over to her house and took care of her for months, she was on FMLA and has just gone back to work last week   Patient Active Problem List   Diagnosis Date Noted  . 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 History of partial thyroidectomy 10/05/2015  . H/O thyroid nodule 10/03/2015  . Morbid obesity ([place]) 06/04/2015  .  Breast lump in female 05/17/2015  . Acanthosis nigricans 03/09/2015  . Allergic rhinitis, seasonal 03/09/2015  . Diabetes mellitus with renal manifestation ([place]) 03/09/2015  . Dyslipidemia 03/09/2015  . Gastro-esophageal reflux disease without esophagitis 03/09/2015  . Hiatal hernia 03/09/2015  . History of anemia 03/09/2015  . H/O: hysterectomy 03/09/2015  . Uterus, adenomyosis 03/09/2015  . Calculus of kidney 03/09/2015  . Benign hypertension 03/09/2015  . Extreme obesity 03/09/2015  . Vitamin D deficiency 03/09/2015  . Apnea, sleep 09/12/2014  . Microalbuminuria 05/29/2014    Past Surgical History:  Procedure  Laterality Date  . ABDOMINAL HYSTERECTOMY  03/2012  . BREAST BIOPSY Right 12/2005   Normal-Cyst  . BREAST SURGERY Bilateral 1990   Reduction  . COLONOSCOPY WITH PROPOFOL N/A 05/07/2017   Procedure: COLONOSCOPY WITH PROPOFOL;  Surgeon: Jonathon Bellows, MD;  Location: [facility] ENDOSCOPY;  Service: Gastroenterology;  Laterality: N/A;  . OOPHORECTOMY Left 03/2011  . REDUCTION MAMMAPLASTY Bilateral   . SLEEVE GASTROPLASTY  09/2016   [place]  . thyroidectomy Left 10/08/2015    Family History  Problem Relation Age of Onset  . Hypertension Mother   . Cirrhosis Mother   . COPD Mother        Heavy Smoker  . Hepatitis C Mother   . Alcohol abuse Father   . Heart disease Father   . Heart attack Father   . Diabetes Brother   . Obesity Brother   . Lung cancer Maternal Grandmother        Snuff  . Diabetes Maternal Grandmother   . Alcoholism Maternal Grandmother   . Prostate cancer Paternal Grandfather   . Alcoholism Maternal Grandfather   . Diabetes Maternal Grandfather   . Breast cancer Cousin     Social History   Tobacco Use  . Smoking status: Never Smoker  . Smokeless tobacco: Never Used  Substance Use Topics  . Alcohol use: Yes    Alcohol/week: 0.0 standard drinks    Comment: wine-social drinker     Current Outpatient Medications:  .  Biotin 5000 MCG TABS, Take 5,000 mg by mouth 3 (three) times a week., Disp: , Rfl:  .  Cholecalciferol (VITAMIN D) 2000 units CAPS, Take 1 capsule (2,000 Units total) by mouth daily., Disp: 30 capsule, Rfl:  .  doxycycline (VIBRA-TABS) 100 MG tablet, Take 1 tablet (100 mg total) by mouth 2 (two) times daily., Disp: 20 tablet, Rfl: 0 .  ezetimibe (ZETIA) 10 MG tablet, Take 1 tablet (10 mg total) by mouth daily., Disp: 90 tablet, Rfl: 3 .  Flaxseed, Linseed, (FLAXSEED OIL PO), Take by mouth., Disp: , Rfl:  .  fluticasone (FLONASE) 50 MCG/ACT nasal spray, Place 2 sprays into both nostrils daily., Disp: 16 g, Rfl: 6 .  lisinopril (ZESTRIL) 5 MG tablet, Take 1  tablet by mouth once daily, Disp: 90 tablet, Rfl: 1 .  loratadine (CLARITIN) 10 MG tablet, Take by mouth., Disp: , Rfl:  .  Multiple Vitamin (MULTIVITAMIN) tablet, Take 1 tablet by mouth daily., Disp: , Rfl:  .  predniSONE (DELTASONE) 10 MG tablet, Take 1 tablet (10 mg total) by mouth 3 (three) times daily before meals., Disp: 15 tablet, Rfl: 0 .  Thiamine HCl (VITAMIN B-1 PO), Take 500 mg by mouth daily., Disp: , Rfl:  .  vitamin B-12 (CYANOCOBALAMIN) 1000 MCG tablet, Take 1,000 mcg by mouth 2 (two) times a week., Disp: , Rfl:  .  vitamin C (ASCORBIC ACID) 500 MG tablet, Take 500 mg by mouth daily., Disp: ,  Rfl:   Allergies  Allergen Reactions  . Zinc     Nausea, vomiting and diarrhea   . Latex Rash    I personally reviewed active problem list, medication list, allergies, family history, social history, health maintenance with the patient/caregiver today.   ROS  Constitutional: Negative for fever, positive for  weight change.  Respiratory: Negative for cough and shortness of breath.   Cardiovascular: Negative for chest pain or palpitations.  Gastrointestinal: Negative for abdominal pain, no bowel changes.  Musculoskeletal: Negative for gait problem or joint swelling.  Skin: Negative for rash.  Neurological: Negative for dizziness or headache.  No other specific complaints in a complete review of systems (except as listed in HPI above).  Objective  Vitals:   10/25/19 0752  Pulse: 79  Resp: 16  Temp: 97.8 F (36.6 C)  TempSrc: Temporal  SpO2: 98%  Weight: 242 lb 6.4 oz (110 kg)  Height: 5\' 1"  (1.549 m)    Body mass index is 45.8 kg/m.  Physical Exam  Constitutional: Patient appears well-developed and well-nourished. Obese  No distress.  HEENT: head atraumatic, normocephalic, pupils equal and reactive to light Cardiovascular: Normal rate, regular rhythm and normal heart sounds.  No murmur heard. No BLE edema. Pulmonary/Chest: Effort normal and breath sounds normal. No  respiratory distress. Abdominal: Soft.  There is no tenderness. Psychiatric: Patient has a normal mood and affect. behavior is normal. Judgment and thought content normal. Muscular Skeletal: pain during palpation of left anterior and lateral shoulder, positive impingement sign, decrease internal rotation , positive empty can sign  PHQ2/9: Depression screen [facility] 2/9 10/25/2019 07/25/2019 06/21/2019 04/11/2019 10/06/2018  Decreased Interest 0 0 0 0 0  Down, Depressed, Hopeless 0 0 0 0 0  PHQ - 2 Score 0 0 0 0 0  Altered sleeping 0 0 2 0 0  Tired, decreased energy 0 0 2 0 0  Change in appetite 0 0 1 0 0  Feeling bad or failure about yourself  0 0 0 0 0  Trouble concentrating 0 0 0 0 0  Moving slowly or fidgety/restless 0 0 0 0 0  Suicidal thoughts 0 0 0 0 0  PHQ-9 Score 0 0 5 0 0  Difficult doing work/chores - - Not difficult at all - -  Some recent data might be hidden    phq 9 is negative   Fall Risk: Fall Risk  10/25/2019 07/25/2019 06/21/2019 04/11/2019 10/06/2018  Falls in the past year? 0 0 0 0 0  Number falls in past yr: 0 0 0 0 0  Injury with Fall? 0 0 0 0 0     Functional Status Survey: Is the patient deaf or have difficulty hearing?: No Does the patient have difficulty seeing, even when wearing glasses/contacts?: No Does the patient have difficulty concentrating, remembering, or making decisions?: No Does the patient have difficulty walking or climbing stairs?: No Does the patient have difficulty dressing or bathing?: No Does the patient have difficulty doing errands alone such as visiting a doctor's office or shopping?: No    Assessment & Plan   1. Type 2 diabetes mellitus with microalbuminuria, without long-term current use of insulin (HCC)  - POCT HgB A1C  2. Microalbuminuria  - lisinopril (ZESTRIL) 10 MG tablet; Take 1 tablet (10 mg total) by mouth daily.  Dispense: 90 tablet; Refill: 0  3. Dyslipidemia  - rosuvastatin (CRESTOR) 5 MG tablet; Take 1 tablet (5 mg  total) by mouth daily.  Dispense: 90 tablet; Refill:  0 - Lipid panel  4. History of bariatric surgery  Gaining her weight back, discussed consider resuming keto diet   5. History of partial thyroidectomy  - TSH  6. Morbid obesity ([place])  Discussed with the patient the risk posed by an increased BMI. Discussed importance of portion control, calorie counting and at least 150 minutes of physical activity weekly. Avoid sweet beverages and drink more water. Eat at least 6 servings of fruit and vegetables daily   7. OSA (obstructive sleep apnea)  Discussed repeating study   8. B12 deficiency  Taking supplementation she prefers not getting it checked at this time  9. Vitamin B1 deficiency  She does not want to have labs at this time  10. Anterior shoulder pain  - Ambulatory referral to Orthopedic Surgery  11. Dyslipidemia associated with type 2 diabetes mellitus ([place])  She will try crestor again  12. Benign hypertension  - lisinopril (ZESTRIL) 10 MG tablet; Take 1 tablet (10 mg total) by mouth daily.  Dispense: 90 tablet; Refill: 0 - CBC - COMPLETE METABOLIC PANEL WITH GFR  13. Vitamin D deficiency  Continue vitamin D supplementation

## 2019-10-26 LAB — LIPID PANEL
Cholesterol: 297 mg/dL — ABNORMAL HIGH (ref <200)
HDL: 49 mg/dL — ABNORMAL LOW (ref >=50)
LDL Cholesterol (Calc): 207 mg/dL (calc) — ABNORMAL HIGH
Non-HDL Cholesterol (Calc): 248 mg/dL (calc) — ABNORMAL HIGH (ref <130)
Total CHOL/HDL Ratio: 6.1 (calc) — ABNORMAL HIGH (ref <5.0)
Triglycerides: 215 mg/dL — ABNORMAL HIGH (ref <150)

## 2019-10-26 LAB — COMPLETE METABOLIC PANEL WITH GFR
AG Ratio: 1.7 (calc) (ref 1.0–2.5)
ALT: 16 U/L (ref 6–29)
AST: 17 U/L (ref 10–35)
Albumin: 4.6 g/dL (ref 3.6–5.1)
Alkaline phosphatase (APISO): 60 U/L (ref 37–153)
BUN: 14 mg/dL (ref 7–25)
CO2: 28 mmol/L (ref 20–32)
Calcium: 9.6 mg/dL (ref 8.6–10.4)
Chloride: 103 mmol/L (ref 98–110)
Creat: 0.6 mg/dL (ref 0.50–1.05)
GFR, Est African American: 121 mL/min/{1.73_m2} (ref >=60)
GFR, Est Non African American: 105 mL/min/{1.73_m2} (ref >=60)
Globulin: 2.7 g/dL (calc) (ref 1.9–3.7)
Glucose, Bld: 91 mg/dL (ref 65–99)
Potassium: 4.2 mmol/L (ref 3.5–5.3)
Sodium: 138 mmol/L (ref 135–146)
Total Bilirubin: 0.3 mg/dL (ref 0.2–1.2)
Total Protein: 7.3 g/dL (ref 6.1–8.1)

## 2019-10-26 LAB — CBC
HCT: 40.9 % (ref 35.0–45.0)
Hemoglobin: 13.4 g/dL (ref 11.7–15.5)
MCH: 28.3 pg (ref 27.0–33.0)
MCHC: 32.8 g/dL (ref 32.0–36.0)
MCV: 86.3 fL (ref 80.0–100.0)
MPV: 10.1 fL (ref 7.5–12.5)
Platelets: 339 10*3/uL (ref 140–400)
RBC: 4.74 10*6/uL (ref 3.80–5.10)
RDW: 13.1 % (ref 11.0–15.0)
WBC: 4.3 10*3/uL (ref 3.8–10.8)

## 2019-10-26 LAB — TSH: TSH: 3.09 mIU/L

## 2019-10-27 ENCOUNTER — Encounter: Payer: Self-pay | Admitting: Family Medicine

## 2020-01-26 ENCOUNTER — Encounter: Payer: Self-pay | Admitting: Family Medicine

## 2020-01-26 ENCOUNTER — Other Ambulatory Visit: Payer: Self-pay

## 2020-01-26 ENCOUNTER — Ambulatory Visit: Payer: No Typology Code available for payment source | Admitting: Family Medicine

## 2020-01-26 VITALS — BP 120/90 | HR 87 | Temp 97.5°F | Resp 16 | Ht 61.0 in | Wt 239.5 lb

## 2020-01-26 DIAGNOSIS — Z9884 Bariatric surgery status: Secondary | ICD-10-CM

## 2020-01-26 DIAGNOSIS — E538 Deficiency of other specified B group vitamins: Secondary | ICD-10-CM

## 2020-01-26 DIAGNOSIS — R809 Proteinuria, unspecified: Secondary | ICD-10-CM | POA: Diagnosis not present

## 2020-01-26 DIAGNOSIS — Z1159 Encounter for screening for other viral diseases: Secondary | ICD-10-CM

## 2020-01-26 DIAGNOSIS — E519 Thiamine deficiency, unspecified: Secondary | ICD-10-CM

## 2020-01-26 DIAGNOSIS — G4733 Obstructive sleep apnea (adult) (pediatric): Secondary | ICD-10-CM

## 2020-01-26 DIAGNOSIS — E1169 Type 2 diabetes mellitus with other specified complication: Secondary | ICD-10-CM

## 2020-01-26 DIAGNOSIS — Z1211 Encounter for screening for malignant neoplasm of colon: Secondary | ICD-10-CM

## 2020-01-26 DIAGNOSIS — I10 Essential (primary) hypertension: Secondary | ICD-10-CM | POA: Diagnosis not present

## 2020-01-26 DIAGNOSIS — E785 Hyperlipidemia, unspecified: Secondary | ICD-10-CM | POA: Diagnosis not present

## 2020-01-26 DIAGNOSIS — E559 Vitamin D deficiency, unspecified: Secondary | ICD-10-CM

## 2020-01-26 DIAGNOSIS — E1129 Type 2 diabetes mellitus with other diabetic kidney complication: Secondary | ICD-10-CM | POA: Diagnosis not present

## 2020-01-26 LAB — COMPLETE METABOLIC PANEL WITH GFR
AG Ratio: 1.8 (calc) (ref 1.0–2.5)
ALT: 25 U/L (ref 6–29)
AST: 19 U/L (ref 10–35)
Albumin: 4.5 g/dL (ref 3.6–5.1)
Alkaline phosphatase (APISO): 57 U/L (ref 37–153)
BUN: 11 mg/dL (ref 7–25)
CO2: 28 mmol/L (ref 20–32)
Calcium: 9.7 mg/dL (ref 8.6–10.4)
Chloride: 105 mmol/L (ref 98–110)
Creat: 0.65 mg/dL (ref 0.50–1.05)
GFR, Est African American: 118 mL/min/{1.73_m2} (ref >=60)
GFR, Est Non African American: 102 mL/min/{1.73_m2} (ref >=60)
Globulin: 2.5 g/dL (calc) (ref 1.9–3.7)
Glucose, Bld: 90 mg/dL (ref 65–99)
Potassium: 4 mmol/L (ref 3.5–5.3)
Sodium: 141 mmol/L (ref 135–146)
Total Bilirubin: 0.3 mg/dL (ref 0.2–1.2)
Total Protein: 7 g/dL (ref 6.1–8.1)

## 2020-01-26 LAB — LIPID PANEL
Cholesterol: 199 mg/dL (ref <200)
HDL: 51 mg/dL (ref >=50)
LDL Cholesterol (Calc): 116 mg/dL (calc) — ABNORMAL HIGH
Non-HDL Cholesterol (Calc): 148 mg/dL (calc) — ABNORMAL HIGH (ref <130)
Total CHOL/HDL Ratio: 3.9 (calc) (ref <5.0)
Triglycerides: 200 mg/dL — ABNORMAL HIGH (ref <150)

## 2020-01-26 LAB — POCT GLYCOSYLATED HEMOGLOBIN (HGB A1C): Hemoglobin A1C: 5.9 % — AB (ref 4.0–5.6)

## 2020-01-26 MED ORDER — LISINOPRIL 10 MG PO TABS: 10.0000 mg | ORAL_TABLET | Freq: Every day | ORAL | 1 refills | Status: DC

## 2020-01-26 MED ORDER — ROSUVASTATIN CALCIUM 5 MG PO TABS: 5.0000 mg | ORAL_TABLET | Freq: Every day | ORAL | 1 refills | Status: DC

## 2020-01-26 NOTE — Progress Notes (Signed)
Name: Debra Contreras   MRN: [id number]    DOB: 12-27-1965   Date:01/26/2020       Progress Note  Subjective  Chief Complaint  Chief Complaint  Patient presents with  . Diabetes  . Hypertension  . Gastroesophageal Reflux  . Dyslipidemia    HPI  Thyroid nodule: she was sitting on her desk working on a report and felt a large nodule on the left side back in October 2016, she had a partial thyroidectomy ( removal of left side) in March 2017, and is doing well since. Last TSH was at goal; no skin/hair/nail changes,  no heart palpitations,denies constipation.  DMII with renal manifestation, gingivitis, obesity, dyslipidemia: she is doing well, no longer taking Metforminbut still takes Lisinopril 10 mg for proteinuria, urine micro was elevated in the past at50, She is not checking glucose at home. Eye exam is up to date. She denies polyphagia, polydipsia or polyuria.A1C was was 5.6 %She is on ace and crestor. 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 Weight went down to 186 lbs ( lowest after bariatric surgery )  Went up to 242 lbs in 2021 today is slightly lower at 239.5 lbs   . HTN:DBP elevated upon arrival today, no chest pain or palpitation. She is currently on lisinopril 10 mg daily and denies side effects   Dyslipidemia: she is taking  5 mg Crestor and not myalgia, we will recheck labs today   OSA: not using CPAP machine because she can couldn't afford it, but willing to go back for a repeat study now. Symptoms improved with weight loss, but she has gained it back,  she wakes up feeling tired, now her DBP is elevated today, we will set her up for another study   Frozen Shoulder:  she states  she woke up about 6  months ago with left shoulder pain, she has decrease rom of left shoulder, worse with abduction, pain is worse on left anterior shoulder. Pain is described as sharp, tender to touch. Sometimes radiates towards her neck but never down her left arm. We gave her prednisone orally back in Dec, it improved symptoms initially but it got progressively worse . She is going to [facility] and had two steroid injections and states last one seems to be lasting longer   Skin ulceration: abdominal fold, resolved now.   Patient Active Problem List   Diagnosis Date Noted  . 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 History of partial thyroidectomy 10/05/2015  . H/O thyroid nodule 10/03/2015  . Morbid obesity ([place]) 06/04/2015  . Breast lump in female 05/17/2015  . Acanthosis nigricans 03/09/2015  . Allergic rhinitis, seasonal 03/09/2015  . Diabetes mellitus with renal manifestation ([place]) 03/09/2015  . Dyslipidemia 03/09/2015  . Gastro-esophageal reflux disease without esophagitis 03/09/2015  . Hiatal hernia 03/09/2015  . History of anemia 03/09/2015  . H/O: hysterectomy 03/09/2015  . Uterus, adenomyosis 03/09/2015  . Calculus of kidney 03/09/2015  . Benign hypertension 03/09/2015  . Extreme obesity 03/09/2015  . Vitamin D deficiency 03/09/2015  . Apnea, sleep 09/12/2014  . Microalbuminuria 05/29/2014    Past Surgical History:  Procedure Laterality Date  . ABDOMINAL HYSTERECTOMY  03/2012  .  BREAST BIOPSY Right 12/2005   Normal-Cyst  . BREAST SURGERY Bilateral 1990   Reduction  . COLONOSCOPY WITH PROPOFOL N/A 05/07/2017   Procedure: COLONOSCOPY WITH PROPOFOL;  Surgeon: Jonathon Bellows, MD;  [facility] ENDOSCOPY;  Service: Gastroenterology;  Laterality: N/A;  . OOPHORECTOMY Left 03/2011  . REDUCTION MAMMAPLASTY Bilateral   . SLEEVE GASTROPLASTY  09/2016   [place]  . thyroidectomy Left 10/08/2015    Family History  Problem Relation Age of Onset  . Hypertension Mother    . Cirrhosis Mother   . COPD Mother        Heavy Smoker  . Hepatitis C Mother   . Alcohol abuse Father   . Heart disease Father   . Heart attack Father   . Diabetes Brother   . Obesity Brother   . Lung cancer Maternal Grandmother        Snuff  . Diabetes Maternal Grandmother   . Alcoholism Maternal Grandmother   . Prostate cancer Paternal Grandfather   . Alcoholism Maternal Grandfather   . Diabetes Maternal Grandfather   . Breast cancer Cousin     Social History   Tobacco Use  . Smoking status: Never Smoker  . Smokeless tobacco: Never Used  Substance Use Topics  . Alcohol use: Yes    Alcohol/week: 0.0 standard drinks    Comment: wine-social drinker     Current Outpatient Medications:  .  Biotin 5000 MCG TABS, Take 5,000 mg by mouth 3 (three) times a week., Disp: , Rfl:  .  Cholecalciferol (VITAMIN D) 2000 units CAPS, Take 1 capsule (2,000 Units total) by mouth daily., Disp: 30 capsule, Rfl:  .  Flaxseed, Linseed, (FLAXSEED OIL PO), Take by mouth., Disp: , Rfl:  .  fluticasone (FLONASE) 50 MCG/ACT nasal spray, Place 2 sprays into both nostrils daily., Disp: 16 g, Rfl: 6 .  lisinopril (ZESTRIL) 10 MG tablet, Take 1 tablet (10 mg total) by mouth daily., Disp: 90 tablet, Rfl: 0 .  loratadine (CLARITIN) 10 MG tablet, Take by mouth., Disp: , Rfl:  .  Multiple Vitamin (MULTIVITAMIN) tablet, Take 1 tablet by mouth daily., Disp: , Rfl:  .  rosuvastatin (CRESTOR) 5 MG tablet, Take 1 tablet (5 mg total) by mouth daily., Disp: 90 tablet, Rfl: 0 .  Thiamine HCl (VITAMIN B-1 PO), Take 500 mg by mouth daily., Disp: , Rfl:  .  vitamin B-12 (CYANOCOBALAMIN) 1000 MCG tablet, Take 1,000 mcg by mouth 2 (two) times a week., Disp: , Rfl:  .  vitamin C (ASCORBIC ACID) 500 MG tablet, Take 500 mg by mouth daily., Disp: , Rfl:   Allergies  Allergen Reactions  . Zinc     Nausea, vomiting and diarrhea   . Latex Rash    I personally reviewed active problem list, medication list, allergies,  family history, social history, health maintenance with the patient/caregiver today.   ROS  Constitutional: Negative for fever or weight change.  Respiratory: Negative for cough and shortness of breath.   Cardiovascular: Negative for chest pain or palpitations.  Gastrointestinal: Negative for abdominal pain, no bowel changes.  Musculoskeletal: Negative for gait problem or joint swelling.  Skin: Negative for rash.  Neurological: Negative for dizziness or headache.  No other specific complaints in a complete review of systems (except as listed in HPI above).  Objective  Vitals:   01/26/20 0751  BP: 120/90  Pulse: 87  Resp: 16  Temp: (!) 97.5 F (36.4 C)  TempSrc: Temporal  SpO2: 98%  Weight: 239 lb  8 oz (108.6 kg)  Height: 5\' 1"  (1.549 m)    Body mass index is 45.25 kg/m.  Physical Exam  Constitutional: Patient appears well-developed and well-nourished. Obese  No distress.  HEENT: head atraumatic, normocephalic, pupils equal and reactive to light, , neck supple, throat within normal limits Cardiovascular: Normal rate, regular rhythm and normal heart sounds.  No murmur heard. No BLE edema. Pulmonary/Chest: Effort normal and breath sounds normal. No respiratory distress. Abdominal: Soft.  There is no tenderness. Psychiatric: Patient has a normal mood and affect. behavior is normal. Judgment and thought content normal.  PHQ2/9: Depression screen [facility] 2/9 01/26/2020 10/25/2019 07/25/2019 06/21/2019 04/11/2019  Decreased Interest 0 0 0 0 0  Down, Depressed, Hopeless 0 0 0 0 0  PHQ - 2 Score 0 0 0 0 0  Altered sleeping 0 0 0 2 0  Tired, decreased energy 0 0 0 2 0  Change in appetite 0 0 0 1 0  Feeling bad or failure about yourself  0 0 0 0 0  Trouble concentrating 0 0 0 0 0  Moving slowly or fidgety/restless 0 0 0 0 0  Suicidal thoughts 0 0 0 0 0  PHQ-9 Score 0 0 0 5 0  Difficult doing work/chores - - - Not difficult at all -  Some recent data might be hidden    phq 9 is  negative   Fall Risk: Fall Risk  01/26/2020 10/25/2019 07/25/2019 06/21/2019 04/11/2019  Falls in the past year? 0 0 0 0 0  Number falls in past yr: 0 0 0 0 0  Injury with Fall? 0 0 0 0 0     Functional Status Survey: Is the patient deaf or have difficulty hearing?: No Does the patient have difficulty seeing, even when wearing glasses/contacts?: No Does the patient have difficulty concentrating, remembering, or making decisions?: No Does the patient have difficulty walking or climbing stairs?: No Does the patient have difficulty dressing or bathing?: No Does the patient have difficulty doing errands alone such as visiting a doctor's office or shopping?: No    Assessment & Plan   1. Dyslipidemia  - rosuvastatin (CRESTOR) 5 MG tablet; Take 1 tablet (5 mg total) by mouth daily.  Dispense: 90 tablet; Refill: 1 - Lipid panel - COMPLETE METABOLIC PANEL WITH GFR  2. Microalbuminuria  - lisinopril (ZESTRIL) 10 MG tablet; Take 1 tablet (10 mg total) by mouth daily.  Dispense: 90 tablet; Refill: 1  3. Benign hypertension  - lisinopril (ZESTRIL) 10 MG tablet; Take 1 tablet (10 mg total) by mouth daily.  Dispense: 90 tablet; Refill: 1  4. Type 2 diabetes mellitus with microalbuminuria, without long-term current use of insulin (HCC)  - POCT HgB A1C  5. Need for hepatitis C screening test  She states she had it done, mother was positive for hepatitis C   6. B12 deficiency  Continue supplementation   7. OSA (obstructive sleep apnea)  She has been off machine for a while , willing to go back for sleep study   8. History of bariatric surgery  She does not want to get vitamin levels at this time   9. Morbid obesity ([place])  Discussed with the patient the risk posed by an increased BMI. Discussed importance of portion control, calorie counting and at least 150 minutes of physical activity weekly. Avoid sweet beverages and drink more water. Eat at least 6 servings of fruit and  vegetables daily   10. Vitamin B1 deficiency  Continue supplementation  11. Dyslipidemia associated with type 2 diabetes mellitus ([place])  - rosuvastatin (CRESTOR) 5 MG tablet; Take 1 tablet (5 mg total) by mouth daily.  Dispense: 90 tablet; Refill: 1 - Lipid panel - COMPLETE METABOLIC PANEL WITH GFR  12. Vitamin D deficiency  Continue supplementation   13. Colon cancer screening  - Ambulatory referral to Gastroenterology

## 2020-01-27 ENCOUNTER — Other Ambulatory Visit: Payer: Self-pay | Admitting: Family Medicine

## 2020-01-27 DIAGNOSIS — I10 Essential (primary) hypertension: Secondary | ICD-10-CM

## 2020-01-27 DIAGNOSIS — R809 Proteinuria, unspecified: Secondary | ICD-10-CM

## 2020-01-27 DIAGNOSIS — E785 Hyperlipidemia, unspecified: Secondary | ICD-10-CM

## 2020-01-27 DIAGNOSIS — E1169 Type 2 diabetes mellitus with other specified complication: Secondary | ICD-10-CM

## 2020-04-23 ENCOUNTER — Ambulatory Visit: Payer: Self-pay | Admitting: *Deleted

## 2020-04-23 NOTE — Telephone Encounter (Signed)
C/o localized red raised rash on right neck noted , approx. 6-8 inches 20 minutes ago. Swelling noted with moderate itching red "bumps" hot to touch and burns from itching. Denies difficulty breathing or SOB. No other areas noted to body with rash. Patient reports eating eggs this am and tuna last night. Unable to determine cause. Care advise given. Patient verbalized understanding of care advise and to call back if symptoms worsen. Patient reports PCP can contact her via MyChart for any other care advise.    Reason for Disposition  Mild localized rash  Answer Assessment - Initial Assessment Questions 1. APPEARANCE of RASH: "Describe the rash."      Red raised bumps 2. LOCATION: "Where is the rash located?"      Right neck  3. NUMBER: "How many spots are there?"      One localized area  4. SIZE: "How big are the spots?" (Inches, centimeters or compare to size of a coin)       6-8 inches  5. ONSET: "When did the rash start?"      20 minutes ago  6. ITCHING: "Does the rash itch?" If Yes, ask: "How bad is the itch?"  (Scale 1-10; or mild, moderate, severe)     6 moderate  7. PAIN: "Does the rash hurt?" If Yes, ask: "How bad is the pain?"  (Scale 1-10; or mild, moderate, severe)     No pain  8. OTHER SYMPTOMS: "Do you have any other symptoms?" (e.g., fever)     No  9. PREGNANCY: "Is there any chance you are pregnant?" "When was your last menstrual period?"     na  Protocols used: RASH OR REDNESS - LOCALIZED-A-AH

## 2020-04-24 ENCOUNTER — Ambulatory Visit: Payer: No Typology Code available for payment source | Admitting: Family Medicine

## 2020-04-24 ENCOUNTER — Other Ambulatory Visit: Payer: Self-pay

## 2020-04-24 ENCOUNTER — Encounter: Payer: Self-pay | Admitting: Family Medicine

## 2020-04-24 VITALS — BP 136/84 | HR 86 | Temp 98.6°F | Resp 16 | Ht 61.0 in | Wt 239.7 lb

## 2020-04-24 DIAGNOSIS — R202 Paresthesia of skin: Secondary | ICD-10-CM | POA: Diagnosis not present

## 2020-04-24 DIAGNOSIS — Z23 Encounter for immunization: Secondary | ICD-10-CM

## 2020-04-24 DIAGNOSIS — R21 Rash and other nonspecific skin eruption: Secondary | ICD-10-CM

## 2020-04-24 MED ORDER — TRIAMCINOLONE ACETONIDE 0.1 % EX CREA: 1.0000 "application " | TOPICAL_CREAM | Freq: Two times a day (BID) | CUTANEOUS | 0 refills | Status: DC

## 2020-04-24 NOTE — Telephone Encounter (Signed)
lvm to schedule appt.  

## 2020-04-24 NOTE — Progress Notes (Signed)
Name: Debra Contreras   MRN: [id number]    DOB: November 24, 1965   Date:04/24/2020       Progress Note  Subjective  Chief Complaint  Rash on Neck  HPI  Rash: she states she noticed some pruritus and redness yesterday am on the right side of her neck, she took Benadryl last night. Today the rash is unchanged. No oozing, it is red and has increase in warmth but not tender to touch, she has a history of eczema but looks different this time  Paresthesia hands: happening over the past few weeks , she states she has been working different jobs because they are short staffed, she just placed her resignation today and will monitor to see if symptoms improves or resolves  Patient Active Problem List   Diagnosis Date Noted  . 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 History of partial thyroidectomy 10/05/2015  . H/O thyroid nodule 10/03/2015  . Morbid obesity ([place]) 06/04/2015  . Breast lump in female 05/17/2015  . Acanthosis nigricans 03/09/2015  . Allergic rhinitis, seasonal 03/09/2015  . Diabetes mellitus with renal manifestation ([place]) 03/09/2015  . Dyslipidemia 03/09/2015  . Gastro-esophageal reflux disease without esophagitis 03/09/2015  . Hiatal hernia 03/09/2015  . History of anemia 03/09/2015  . H/O: hysterectomy 03/09/2015  . Uterus, adenomyosis 03/09/2015  . Calculus of kidney 03/09/2015  . Benign hypertension 03/09/2015  . Extreme obesity 03/09/2015  . Vitamin D deficiency 03/09/2015  . Apnea, sleep 09/12/2014  . Microalbuminuria 05/29/2014    Past Surgical History:  Procedure Laterality Date  . ABDOMINAL HYSTERECTOMY  03/2012  . BREAST BIOPSY Right 12/2005   Normal-Cyst  . BREAST SURGERY Bilateral 1990   Reduction  . COLONOSCOPY WITH PROPOFOL N/A 05/07/2017   Procedure: COLONOSCOPY WITH PROPOFOL;  Surgeon: Jonathon Bellows, MD;  Location: [facility] ENDOSCOPY;  Service: Gastroenterology;  Laterality: N/A;  . OOPHORECTOMY Left 03/2011  . REDUCTION MAMMAPLASTY Bilateral   . SLEEVE  GASTROPLASTY  09/2016   [place]  . thyroidectomy Left 10/08/2015    Family History  Problem Relation Age of Onset  . Hypertension Mother   . Cirrhosis Mother   . COPD Mother        Heavy Smoker  . Hepatitis C Mother   . Alcohol abuse Father   . Heart disease Father   . Heart attack Father   . Diabetes Brother   . Obesity Brother   . Lung cancer Maternal Grandmother        Snuff  . Diabetes Maternal Grandmother   . Alcoholism Maternal Grandmother   . Prostate cancer Paternal Grandfather   . Alcoholism Maternal Grandfather   . Diabetes Maternal Grandfather   . Breast cancer Cousin     Social History   Tobacco Use  . Smoking status: Never Smoker  . Smokeless tobacco: Never Used  Substance Use Topics  . Alcohol use: Yes    Alcohol/week: 0.0 standard drinks    Comment: wine-social drinker     Current Outpatient Medications:  .  Biotin 5000 MCG TABS, Take 5,000 mg by mouth 3 (three) times a week., Disp: , Rfl:  .  Cholecalciferol (VITAMIN D) 2000 units CAPS, Take 1 capsule (2,000 Units total) by mouth daily., Disp: 30 capsule, Rfl:  .  Flaxseed, Linseed, (FLAXSEED OIL PO), Take by mouth., Disp: , Rfl:  .  fluticasone (FLONASE) 50 MCG/ACT nasal spray, Place 2 sprays into both nostrils daily., Disp: 16 g, Rfl: 6 .  lisinopril (ZESTRIL) 10 MG tablet, Take 1 tablet (10  mg total) by mouth daily., Disp: 90 tablet, Rfl: 1 .  loratadine (CLARITIN) 10 MG tablet, Take by mouth., Disp: , Rfl:  .  Multiple Vitamin (MULTIVITAMIN) tablet, Take 1 tablet by mouth daily., Disp: , Rfl:  .  rosuvastatin (CRESTOR) 5 MG tablet, Take 1 tablet (5 mg total) by mouth daily., Disp: 90 tablet, Rfl: 1 .  Thiamine HCl (VITAMIN B-1 PO), Take 500 mg by mouth daily., Disp: , Rfl:  .  Turmeric (QC TUMERIC COMPLEX) 500 MG CAPS, Take by mouth., Disp: , Rfl:  .  vitamin B-12 (CYANOCOBALAMIN) 1000 MCG tablet, Take 1,000 mcg by mouth 2 (two) times a week., Disp: , Rfl:  .  vitamin C (ASCORBIC ACID) 500 MG  tablet, Take 500 mg by mouth daily., Disp: , Rfl:   Allergies  Allergen Reactions  . Zinc     Nausea, vomiting and diarrhea   . Latex Rash    I personally reviewed active problem list, medication list, allergies, family history, social history, health maintenance with the patient/caregiver today.   ROS  Ten systems reviewed and is negative except as mentioned in HPI   Objective  Vitals:   04/24/20 1456  BP: 136/84  Pulse: 86  Resp: 16  Temp: 98.6 F (37 C)  TempSrc: Oral  SpO2: 97%  Weight: 239 lb 11.2 oz (108.7 kg)  Height: 5\' 1"  (1.549 m)    Body mass index is 45.29 kg/m.  Physical Exam  Constitutional: Patient appears well-developed and well-nourished. Obese  No distress.  HEENT: head atraumatic, normocephalic, pupils equal and reactive to light,  neck supple Cardiovascular: Normal rate, regular rhythm and normal heart sounds.  No murmur heard. No BLE edema. Pulmonary/Chest: Effort normal and breath sounds normal. No respiratory distress. Abdominal: Soft.  There is no tenderness. Skin: redness on right side of chest( below neck ), going from clavicle to midline, erythematous, negative for oozing, or tenderness to touch, mild increase in warmth, no vesicles  Psychiatric: Patient has a normal mood and affect. behavior is normal. Judgment and thought content normal.  Recent Results (from the past 2160 hour(s))  POCT HgB A1C     Status: Abnormal   Collection Time: 01/26/20  8:21 AM  Result Value Ref Range   Hemoglobin A1C 5.9 (A) 4.0 - 5.6 %   HbA1c POC (<> result, manual entry)     HbA1c, POC (prediabetic range)     HbA1c, POC (controlled diabetic range)    Lipid panel     Status: Abnormal   Collection Time: 01/26/20  8:26 AM  Result Value Ref Range   Cholesterol 199 <200 mg/dL   HDL 51 > OR = 50 mg/dL   Triglycerides 200 (H) <150 mg/dL    Comment: . If a non-fasting specimen was collected, consider repeat triglyceride testing on a fasting specimen if  clinically indicated.  Yates Decamp et al. J. of Clin. Lipidol. 8546;2:703-500. Marland Kitchen    LDL Cholesterol (Calc) 116 (H) mg/dL (calc)    Comment: Reference range: <100 . Desirable range <100 mg/dL for primary prevention;   <70 mg/dL for patients with CHD or diabetic patients  with > or = 2 CHD risk factors. Marland Kitchen LDL-C is now calculated using the Martin-Hopkins  calculation, which is a validated novel method providing  better accuracy than the Friedewald equation in the  estimation of LDL-C.  Cresenciano Genre et al. Annamaria Helling. 9381;829(93): 2061-2068  (http://education.[facility].com/faq/FAQ164)    Total CHOL/HDL Ratio 3.9 <5.0 (calc)   Non-HDL Cholesterol (Calc) 148 (H) <  130 mg/dL (calc)    Comment: For patients with diabetes plus 1 major ASCVD risk  factor, treating to a non-HDL-C goal of <100 mg/dL  (LDL-C of <70 mg/dL) is considered a therapeutic  option.   COMPLETE METABOLIC PANEL WITH GFR     Status: None   Collection Time: 01/26/20  8:26 AM  Result Value Ref Range   Glucose, Bld 90 65 - 99 mg/dL    Comment: .            Fasting reference interval .    BUN 11 7 - 25 mg/dL   Creat 0.65 0.50 - 1.05 mg/dL    Comment: For patients >46 years of age, the reference limit for Creatinine is approximately 13% higher for people identified as African-American. .    GFR, Est Non African American 102 > OR = 60 mL/min/1.45m2   GFR, Est African American 118 > OR = 60 mL/min/1.18m2   BUN/Creatinine Ratio NOT APPLICABLE 6 - 22 (calc)   Sodium 141 135 - 146 mmol/L   Potassium 4.0 3.5 - 5.3 mmol/L   Chloride 105 98 - 110 mmol/L   CO2 28 20 - 32 mmol/L   Calcium 9.7 8.6 - 10.4 mg/dL   Total Protein 7.0 6.1 - 8.1 g/dL   Albumin 4.5 3.6 - 5.1 g/dL   Globulin 2.5 1.9 - 3.7 g/dL (calc)   AG Ratio 1.8 1.0 - 2.5 (calc)   Total Bilirubin 0.3 0.2 - 1.2 mg/dL   Alkaline phosphatase (APISO) 57 37 - 153 U/L   AST 19 10 - 35 U/L   ALT 25 6 - 29 U/L      PHQ2/9: Depression screen [facility] 2/9 04/24/2020  01/26/2020 10/25/2019 07/25/2019 06/21/2019  Decreased Interest 0 0 0 0 0  Down, Depressed, Hopeless 0 0 0 0 0  PHQ - 2 Score 0 0 0 0 0  Altered sleeping - 0 0 0 2  Tired, decreased energy - 0 0 0 2  Change in appetite - 0 0 0 1  Feeling bad or failure about yourself  - 0 0 0 0  Trouble concentrating - 0 0 0 0  Moving slowly or fidgety/restless - 0 0 0 0  Suicidal thoughts - 0 0 0 0  PHQ-9 Score - 0 0 0 5  Difficult doing work/chores - - - - Not difficult at all  Some recent data might be hidden    phq 9 is negative   Fall Risk: Fall Risk  04/24/2020 01/26/2020 10/25/2019 07/25/2019 06/21/2019  Falls in the past year? 0 0 0 0 0  Number falls in past yr: 0 0 0 0 0  Injury with Fall? 0 0 0 0 0     Functional Status Survey: Is the patient deaf or have difficulty hearing?: No Does the patient have difficulty seeing, even when wearing glasses/contacts?: No Does the patient have difficulty concentrating, remembering, or making decisions?: No Does the patient have difficulty walking or climbing stairs?: No Does the patient have difficulty dressing or bathing?: No Does the patient have difficulty doing errands alone such as visiting a doctor's office or shopping?: No    Assessment & Plan  1. Rash  - triamcinolone cream (KENALOG) 0.1 %; Apply 1 application topically 2 (two) times daily.  Dispense: 30 g; Refill: 0 Take loratadine in am, benadryl at night   2. Need for immunization against influenza  - Flu Vaccine QUAD 36+ mos IM  3. Paresthesia  She will monitor her  symptoms since recent onset and changing jobs soon.

## 2020-05-29 NOTE — Progress Notes (Signed)
Name: Debra Contreras   MRN: [id number]    DOB: Oct 09, 1965   Date:05/30/2020       Progress Note  Subjective  Chief Complaint  Follow up   HPI   Thyroid nodule: she was sitting on her desk working on a report and felt a large nodule on the left side back in October 2016, she had a partial thyroidectomy ( removal of left side) in March 2017, and is doing well since. Last TSH was at goal; no skin/hair/nail changes, denies dysphagia, no heart palpitations,denies constipation.  DMII with renal manifestation, gingivitis, obesity, dyslipidemia: no longer taking Metforminbut still takes Lisinopril 10 mg for proteinuria, urine micro was elevated in the past at50 and do for recheck , however she does not have insurance at this moment - we will wait until next follow up, She is not checking glucose at home. Eye exam is up to date. She denies polyphagia, polydipsia or polyuria. Her A1C was 5.6 %today is 6.1 %. 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 Weight went down to 186 lbs ( lowest after bariatric surgery )  Weight has been stable now, about the same in the last few visits   HTN:she is compliant with medication, denies chest pain, palpitation or dizziness  Dyslipidemia: she is taking  5 mg Crestor and denies myalgia. LDL still above 70, but she does not have insurance so we will have to wait to adjust the dose    OSA:  Symptoms improved with weight loss, but she has gained it back,  she wakes up feeling tired, she was supposed to have a sleep study but is waiting for insurance to resume   Frozen Shoulder:  she states she woke up about 6  months ago with left shoulder pain, she has  decrease rom of left shoulder, worse with abduction, pain is worse on left anterior shoulder. Pain is described as sharp, tender to touch. Sometimes radiates towards her neck but never down her left arm. She had a few different steroid injections.    Patient Active Problem List   Diagnosis Date Noted  . 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 History of partial thyroidectomy 10/05/2015  . H/O thyroid nodule 10/03/2015  . Morbid obesity ([place]) 06/04/2015  . Breast lump in female 05/17/2015  . Acanthosis nigricans 03/09/2015  . Allergic rhinitis, seasonal 03/09/2015  . Diabetes mellitus with renal manifestation ([place]) 03/09/2015  . Dyslipidemia 03/09/2015  . Gastro-esophageal reflux disease without esophagitis 03/09/2015  . Hiatal hernia 03/09/2015  . History of anemia 03/09/2015  . H/O: hysterectomy 03/09/2015  . Uterus, adenomyosis 03/09/2015  . Calculus of kidney 03/09/2015  . Benign hypertension 03/09/2015  . Extreme obesity 03/09/2015  . Vitamin D deficiency 03/09/2015  . Apnea, sleep 09/12/2014  . Microalbuminuria 05/29/2014    Past Surgical History:  Procedure Laterality Date  . ABDOMINAL HYSTERECTOMY  03/2012  . BREAST BIOPSY Right 12/2005   Normal-Cyst  . BREAST SURGERY Bilateral 1990   Reduction  . COLONOSCOPY WITH PROPOFOL N/A 05/07/2017   Procedure: COLONOSCOPY WITH PROPOFOL;  Surgeon: Jonathon Bellows, MD;  [facility] ENDOSCOPY;  Service: Gastroenterology;  Laterality: N/A;  . OOPHORECTOMY  Left 03/2011  . REDUCTION MAMMAPLASTY Bilateral   . SLEEVE GASTROPLASTY  09/2016   [place]  . thyroidectomy Left 10/08/2015    Family History  Problem Relation Age of Onset  . Hypertension Mother   . Cirrhosis Mother   . COPD Mother        Heavy Smoker  . Hepatitis C Mother   . Alcohol abuse Father   . Heart disease Father   . Heart attack Father   . Diabetes Brother   . Obesity Brother   . Lung cancer Maternal Grandmother        Snuff  . Diabetes Maternal Grandmother    . Alcoholism Maternal Grandmother   . Prostate cancer Paternal Grandfather   . Alcoholism Maternal Grandfather   . Diabetes Maternal Grandfather   . Breast cancer Cousin     Social History   Tobacco Use  . Smoking status: Never Smoker  . Smokeless tobacco: Never Used  Substance Use Topics  . Alcohol use: Yes    Alcohol/week: 0.0 standard drinks    Comment: wine-social drinker     Current Outpatient Medications:  .  Biotin 5000 MCG TABS, Take 5,000 mg by mouth 3 (three) times a week., Disp: , Rfl:  .  Cholecalciferol (VITAMIN D) 2000 units CAPS, Take 1 capsule (2,000 Units total) by mouth daily., Disp: 30 capsule, Rfl:  .  Flaxseed, Linseed, (FLAXSEED OIL PO), Take by mouth., Disp: , Rfl:  .  fluticasone (FLONASE) 50 MCG/ACT nasal spray, Place 2 sprays into both nostrils daily., Disp: 16 g, Rfl: 6 .  lisinopril (ZESTRIL) 10 MG tablet, Take 1 tablet (10 mg total) by mouth daily., Disp: 90 tablet, Rfl: 1 .  loratadine (CLARITIN) 10 MG tablet, Take by mouth., Disp: , Rfl:  .  Multiple Vitamin (MULTIVITAMIN) tablet, Take 1 tablet by mouth daily., Disp: , Rfl:  .  rosuvastatin (CRESTOR) 5 MG tablet, Take 1 tablet (5 mg total) by mouth daily., Disp: 90 tablet, Rfl: 1 .  Thiamine HCl (VITAMIN B-1 PO), Take 500 mg by mouth daily., Disp: , Rfl:  .  triamcinolone cream (KENALOG) 0.1 %, Apply 1 application topically 2 (two) times daily., Disp: 30 g, Rfl: 0 .  Turmeric (QC TUMERIC COMPLEX) 500 MG CAPS, Take by mouth., Disp: , Rfl:  .  vitamin B-12 (CYANOCOBALAMIN) 1000 MCG tablet, Take 1,000 mcg by mouth 2 (two) times a week., Disp: , Rfl:  .  vitamin C (ASCORBIC ACID) 500 MG tablet, Take 500 mg by mouth daily., Disp: , Rfl:   Allergies  Allergen Reactions  . Zinc     Nausea, vomiting and diarrhea   . Latex Rash    I personally reviewed active problem list, medication list, allergies, family history, social history, health maintenance with the patient/caregiver  today.   ROS  Constitutional: Negative for fever or weight change.  Respiratory: Negative for cough and shortness of breath.   Cardiovascular: Negative for chest pain or palpitations.  Gastrointestinal: Negative for abdominal pain, no bowel changes.  Musculoskeletal: Negative for gait problem or joint swelling.  Skin: Negative for rash.  Neurological: Negative for dizziness or headache.  No other specific complaints in a complete review of systems (except as listed in HPI above).  Objective  Vitals:   05/30/20 0748  BP: 124/76  Pulse: 81  Resp: 16  Temp: 98.2 F (36.8 C)  TempSrc: Oral  SpO2: 99%  Weight: 240 lb 8 oz (109.1 kg)  Height: 5\' 1"  (1.549 m)  Body mass index is 45.44 kg/m.  Physical Exam  Constitutional: Patient appears well-developed and well-nourished. Obese  No distress.  HEENT: head atraumatic, normocephalic, pupils equal and reactive to light,  neck supple Cardiovascular: Normal rate, regular rhythm and normal heart sounds.  No murmur heard. No BLE edema. Pulmonary/Chest: Effort normal and breath sounds normal. No respiratory distress. Abdominal: Soft.  There is no tenderness. Psychiatric: Patient has a normal mood and affect. behavior is normal. Judgment and thought content normal.  Diabetic Foot Exam: Diabetic Foot Exam - Simple   Simple Foot Form Diabetic Foot exam was performed with the following findings: Yes 05/30/2020  8:18 AM  Visual Inspection No deformities, no ulcerations, no other skin breakdown bilaterally: Yes Sensation Testing Intact to touch and monofilament testing bilaterally: Yes Pulse Check Posterior Tibialis and Dorsalis pulse intact bilaterally: Yes Comments      PHQ2/9: Depression screen [facility] 2/9 05/30/2020 04/24/2020 01/26/2020 10/25/2019 07/25/2019  Decreased Interest 0 0 0 0 0  Down, Depressed, Hopeless 0 0 0 0 0  PHQ - 2 Score 0 0 0 0 0  Altered sleeping - - 0 0 0  Tired, decreased energy - - 0 0 0  Change in  appetite - - 0 0 0  Feeling bad or failure about yourself  - - 0 0 0  Trouble concentrating - - 0 0 0  Moving slowly or fidgety/restless - - 0 0 0  Suicidal thoughts - - 0 0 0  PHQ-9 Score - - 0 0 0  Difficult doing work/chores - - - - -  Some recent data might be hidden    phq 9 is negative   Fall Risk: Fall Risk  05/30/2020 04/24/2020 01/26/2020 10/25/2019 07/25/2019  Falls in the past year? 1 0 0 0 0  Number falls in past yr: 1 0 0 0 0  Injury with Fall? 0 0 0 0 0  Risk for fall due to : History of fall(s) - - - -  Follow up Falls evaluation completed;Education provided - - - -     Functional Status Survey: Is the patient deaf or have difficulty hearing?: No Does the patient have difficulty seeing, even when wearing glasses/contacts?: No Does the patient have difficulty concentrating, remembering, or making decisions?: No Does the patient have difficulty walking or climbing stairs?: No Does the patient have difficulty dressing or bathing?: No Does the patient have difficulty doing errands alone such as visiting a doctor's office or shopping?: No    Assessment & Plan  1. Type 2 diabetes mellitus with microalbuminuria, without long-term current use of insulin (HCC)  - POCT HgB A1C  2. Dyslipidemia   3. Benign hypertension  - lisinopril (ZESTRIL) 10 MG tablet; Take 1 tablet (10 mg total) by mouth daily.  Dispense: 90 tablet; Refill: 1  4. B12 deficiency  Continue supplementation   5. Morbid obesity ([place])  Discussed with the patient the risk posed by an increased BMI. Discussed importance of portion control, calorie counting and at least 150 minutes of physical activity weekly. Avoid sweet beverages and drink more water. Eat at least 6 servings of fruit and vegetables daily   6. History of bariatric surgery   7. OSA (obstructive sleep apnea)   8. Vitamin B1 deficiency  Taking supplementation   9. Dyslipidemia associated with type 2 diabetes mellitus  (HCC)  Taking Crestor   10. Vitamin D deficiency  Continue supplementation   11. History of partial thyroidectomy   12. Microalbuminuria  -  lisinopril (ZESTRIL) 10 MG tablet; Take 1 tablet (10 mg total) by mouth daily.  Dispense: 90 tablet; Refill: 1

## 2020-05-30 ENCOUNTER — Encounter: Payer: Self-pay | Admitting: Family Medicine

## 2020-05-30 ENCOUNTER — Other Ambulatory Visit: Payer: Self-pay

## 2020-05-30 ENCOUNTER — Ambulatory Visit (INDEPENDENT_AMBULATORY_CARE_PROVIDER_SITE_OTHER): Payer: Self-pay | Admitting: Family Medicine

## 2020-05-30 VITALS — BP 124/76 | HR 81 | Temp 98.2°F | Resp 16 | Ht 61.0 in | Wt 240.5 lb

## 2020-05-30 DIAGNOSIS — E519 Thiamine deficiency, unspecified: Secondary | ICD-10-CM

## 2020-05-30 DIAGNOSIS — E1129 Type 2 diabetes mellitus with other diabetic kidney complication: Secondary | ICD-10-CM

## 2020-05-30 DIAGNOSIS — E1169 Type 2 diabetes mellitus with other specified complication: Secondary | ICD-10-CM

## 2020-05-30 DIAGNOSIS — E890 Postprocedural hypothyroidism: Secondary | ICD-10-CM

## 2020-05-30 DIAGNOSIS — R809 Proteinuria, unspecified: Secondary | ICD-10-CM

## 2020-05-30 DIAGNOSIS — I10 Essential (primary) hypertension: Secondary | ICD-10-CM

## 2020-05-30 DIAGNOSIS — E785 Hyperlipidemia, unspecified: Secondary | ICD-10-CM

## 2020-05-30 DIAGNOSIS — E538 Deficiency of other specified B group vitamins: Secondary | ICD-10-CM

## 2020-05-30 DIAGNOSIS — E559 Vitamin D deficiency, unspecified: Secondary | ICD-10-CM

## 2020-05-30 DIAGNOSIS — Z9884 Bariatric surgery status: Secondary | ICD-10-CM

## 2020-05-30 DIAGNOSIS — G4733 Obstructive sleep apnea (adult) (pediatric): Secondary | ICD-10-CM

## 2020-05-30 LAB — POCT GLYCOSYLATED HEMOGLOBIN (HGB A1C): Hemoglobin A1C: 6.1 % — AB (ref 4.0–5.6)

## 2020-05-30 MED ORDER — LISINOPRIL 10 MG PO TABS: 10.0000 mg | ORAL_TABLET | Freq: Every day | ORAL | 1 refills | Status: DC

## 2020-07-08 ENCOUNTER — Encounter: Payer: Self-pay | Admitting: Family Medicine

## 2020-07-23 ENCOUNTER — Other Ambulatory Visit: Payer: Self-pay

## 2020-07-23 DIAGNOSIS — R809 Proteinuria, unspecified: Secondary | ICD-10-CM

## 2020-07-23 DIAGNOSIS — I10 Essential (primary) hypertension: Secondary | ICD-10-CM

## 2020-07-23 MED ORDER — LISINOPRIL 10 MG PO TABS: 10.0000 mg | ORAL_TABLET | Freq: Every day | ORAL | 0 refills | Status: DC

## 2020-08-05 ENCOUNTER — Encounter: Payer: No Typology Code available for payment source | Admitting: Family Medicine

## 2020-08-27 ENCOUNTER — Other Ambulatory Visit: Payer: Self-pay

## 2020-08-27 DIAGNOSIS — E1169 Type 2 diabetes mellitus with other specified complication: Secondary | ICD-10-CM

## 2020-08-27 DIAGNOSIS — E785 Hyperlipidemia, unspecified: Secondary | ICD-10-CM

## 2020-08-27 MED ORDER — ROSUVASTATIN CALCIUM 5 MG PO TABS: 5.0000 mg | ORAL_TABLET | Freq: Every day | ORAL | 0 refills | Status: DC

## 2020-08-29 ENCOUNTER — Other Ambulatory Visit: Payer: No Typology Code available for payment source

## 2020-09-02 ENCOUNTER — Ambulatory Visit: Admission: RE | Admit: 2020-09-02 | Payer: No Typology Code available for payment source | Source: Home / Self Care

## 2020-09-06 NOTE — Progress Notes (Signed)
Name: Debra Contreras   MRN: [id number]    DOB: 15-Jul-1966   Date:09/09/2020       Progress Note  Subjective  Chief Complaint  Annual Exam  HPI  Patient presents for annual CPE. She will return for regular follow up  Diet: eating a lot of junk food Exercise: discussed 150 minutes per week   [facility] Visit from 09/09/2020 in [facility]  AUDIT-C Score 0     Depression: Phq 9 is  negative Depression screen [facility] 2/9 09/09/2020 05/30/2020 04/24/2020 01/26/2020 10/25/2019  Decreased Interest 1 0 0 0 0  Down, Depressed, Hopeless 1 0 0 0 0  PHQ - 2 Score 2 0 0 0 0  Altered sleeping 2 - - 0 0  Tired, decreased energy 1 - - 0 0  Change in appetite 2 - - 0 0  Feeling bad or failure about yourself  0 - - 0 0  Trouble concentrating 1 - - 0 0  Moving slowly or fidgety/restless 0 - - 0 0  Suicidal thoughts 0 - - 0 0  PHQ-9 Score 8 - - 0 0  Difficult doing work/chores Not difficult at all - - - -  Some recent data might be hidden   Hypertension: BP Readings from Last 3 Encounters:  09/09/20 126/78  05/30/20 124/76  04/24/20 136/84   Obesity: Wt Readings from Last 3 Encounters:  09/09/20 249 lb 12.8 oz (113.3 kg)  05/30/20 240 lb 8 oz (109.1 kg)  04/24/20 239 lb 11.2 oz (108.7 kg)   BMI Readings from Last 3 Encounters:  09/09/20 47.20 kg/m  05/30/20 45.44 kg/m  04/24/20 45.29 kg/m     Vaccines:   Shingrix: 7-64 yo and ask insurance if covered when patient above 54 yo Hepatitis A today to complete series   Hep C Screening: 01/10/14 STD testing and prevention (HIV/chl/gon/syphilis): not interested  Intimate partner violence: neagtive Sexual History : not sexually active  Menstrual History/LMP/Abnormal Bleeding: s/p hysterectomy  Incontinence Symptoms: no urgency or stress incontinence symptoms   Breast cancer:  - Last Mammogram: 08/01/19 - BRCA gene screening: N/A  Osteoporosis: Discussed high calcium and vitamin D supplementation, weight  bearing exercises  Cervical cancer screening: N/A  Skin cancer: Discussed monitoring for atypical lesions  Colorectal cancer: she has it scheduled for March  Lung cancer: Low Dose CT Chest recommended if Age 54-80 years, 20 pack-year currently smoking OR have quit w/in 15years. Patient does not qualify.   ECG: 09/20/12  Advanced Care Planning: A voluntary discussion about advance care planning including the explanation and discussion of advance directives.  Discussed health care proxy and Living will, and the patient was able to identify a health care proxy as niece.    Lipids: Lab Results  Component Value Date   CHOL 199 01/26/2020   CHOL 297 (H) 10/25/2019   CHOL 275 (H) 07/25/2019   Lab Results  Component Value Date   HDL 51 01/26/2020   HDL 49 (L) 10/25/2019   HDL 47 (L) 07/25/2019   Lab Results  Component Value Date   LDLCALC 116 (H) 01/26/2020   LDLCALC 207 (H) 10/25/2019   LDLCALC 169 (H) 07/25/2019   Lab Results  Component Value Date   TRIG 200 (H) 01/26/2020   TRIG 215 (H) 10/25/2019   TRIG 347 (H) 07/25/2019   Lab Results  Component Value Date   CHOLHDL 3.9 01/26/2020   CHOLHDL 6.1 (H) 10/25/2019   CHOLHDL 5.9 (H) 07/25/2019  No results found for: LDLDIRECT  Glucose: Glucose, Bld  Date Value Ref Range Status  01/26/2020 90 65 - 99 mg/dL Final    Comment:    .            Fasting reference interval .   10/25/2019 91 65 - 99 mg/dL Final    Comment:    .            Fasting reference interval .   07/25/2019 89 65 - 99 mg/dL Final    Comment:    .            Fasting reference interval .     Patient Active Problem List   Diagnosis Date Noted  . 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 History of partial thyroidectomy 10/05/2015  . H/O thyroid nodule 10/03/2015  . Morbid obesity ([place]) 06/04/2015  . Breast lump in female 05/17/2015  . Acanthosis nigricans 03/09/2015  . Allergic rhinitis, seasonal 03/09/2015  . Diabetes mellitus with  renal manifestation ([place]) 03/09/2015  . Dyslipidemia 03/09/2015  . Gastro-esophageal reflux disease without esophagitis 03/09/2015  . Hiatal hernia 03/09/2015  . History of anemia 03/09/2015  . H/O: hysterectomy 03/09/2015  . Uterus, adenomyosis 03/09/2015  . Calculus of kidney 03/09/2015  . Benign hypertension 03/09/2015  . Extreme obesity 03/09/2015  . Vitamin D deficiency 03/09/2015  . Apnea, sleep 09/12/2014  . Microalbuminuria 05/29/2014    Past Surgical History:  Procedure Laterality Date  . ABDOMINAL HYSTERECTOMY  03/2012  . BREAST BIOPSY Right 12/2005   Normal-Cyst  . BREAST SURGERY Bilateral 1990   Reduction  . COLONOSCOPY WITH PROPOFOL N/A 05/07/2017   Procedure: COLONOSCOPY WITH PROPOFOL;  Surgeon: Jonathon Bellows, MD;  Location: [facility] ENDOSCOPY;  Service: Gastroenterology;  Laterality: N/A;  . OOPHORECTOMY Left 03/2011  . REDUCTION MAMMAPLASTY Bilateral   . SLEEVE GASTROPLASTY  09/2016   [place]  . thyroidectomy Left 10/08/2015    Family History  Problem Relation Age of Onset  . Hypertension Mother   . Cirrhosis Mother   . COPD Mother        Heavy Smoker  . Hepatitis C Mother   . Alcohol abuse Father   . Heart disease Father   . Heart attack Father   . Diabetes Brother   . Obesity Brother   . Lung cancer Maternal Grandmother        Snuff  . Diabetes Maternal Grandmother   . Alcoholism Maternal Grandmother   . Prostate cancer Paternal Grandfather   . Alcoholism Maternal Grandfather   . Diabetes Maternal Grandfather   . Breast cancer Cousin     Social History   Socioeconomic History  . Marital status: Divorced    Spouse name: Not on file  . Number of children: 0  . Years of education: Not on file  . Highest education level: Bachelor's degree (e.g., BA, AB, BS)  Occupational History  . Occupation: Designer, jewellery   Tobacco Use  . Smoking status: Never Smoker  . Smokeless tobacco: Never Used  Vaping Use  . Vaping Use: Never used  Substance and Sexual  Activity  . Alcohol use: Yes    Alcohol/week: 0.0 standard drinks    Comment: wine-social drinker  . Drug use: No  . Sexual activity: Yes    Partners: Male  Other Topics Concern  . Not on file  Social History Narrative   Lives alone   She is a Designer, jewellery.    Social Determinants of Health   Financial  Resource Strain: Low Risk   . Difficulty of Paying Living Expenses: Not hard at all  Food Insecurity: No Food Insecurity  . Worried About Charity fundraiser in the Last Year: Never true  . Ran Out of Food in the Last Year: Never true  Transportation Needs: No Transportation Needs  . Lack of Transportation (Medical): No  . Lack of Transportation (Non-Medical): No  Physical Activity: Inactive  . Days of Exercise per Week: 0 days  . Minutes of Exercise per Session: 0 min  Stress: Stress Concern Present  . Feeling of Stress : Very much  Social Connections: Moderately Integrated  . Frequency of Communication with Friends and Family: More than three times a week  . Frequency of Social Gatherings with Friends and Family: Never  . Attends Religious Services: More than 4 times per year  . Active Member of Clubs or Organizations: Yes  . Attends Archivist Meetings: More than 4 times per year  . Marital Status: Divorced  Human resources officer Violence: Not At Risk  . Fear of Current or Ex-Partner: No  . Emotionally Abused: No  . Physically Abused: No  . Sexually Abused: No     Current Outpatient Medications:  .  Biotin 5000 MCG TABS, Take 5,000 mg by mouth 3 (three) times a week., Disp: , Rfl:  .  Cholecalciferol (VITAMIN D) 2000 units CAPS, Take 1 capsule (2,000 Units total) by mouth daily., Disp: 30 capsule, Rfl:  .  ELDERBERRY PO, Take by mouth., Disp: , Rfl:  .  Flaxseed, Linseed, (FLAXSEED OIL PO), Take by mouth., Disp: , Rfl:  .  fluticasone (FLONASE) 50 MCG/ACT nasal spray, Place 2 sprays into both nostrils daily., Disp: 16 g, Rfl: 6 .  lisinopril (ZESTRIL) 10 MG  tablet, Take 1 tablet (10 mg total) by mouth daily., Disp: 90 tablet, Rfl: 0 .  loratadine (CLARITIN) 10 MG tablet, Take by mouth., Disp: , Rfl:  .  Multiple Vitamin (MULTIVITAMIN) tablet, Take 1 tablet by mouth daily., Disp: , Rfl:  .  rosuvastatin (CRESTOR) 5 MG tablet, Take 1 tablet (5 mg total) by mouth daily., Disp: 90 tablet, Rfl: 0 .  Thiamine HCl (VITAMIN B-1 PO), Take 500 mg by mouth daily., Disp: , Rfl:  .  triamcinolone cream (KENALOG) 0.1 %, Apply 1 application topically 2 (two) times daily., Disp: 30 g, Rfl: 0 .  Turmeric 500 MG CAPS, Take by mouth., Disp: , Rfl:  .  vitamin B-12 (CYANOCOBALAMIN) 1000 MCG tablet, Take 1,000 mcg by mouth 2 (two) times a week., Disp: , Rfl:  .  vitamin C (ASCORBIC ACID) 500 MG tablet, Take 500 mg by mouth daily., Disp: , Rfl:   Allergies  Allergen Reactions  . Zinc     Nausea, vomiting and diarrhea   . Latex Rash     ROS  Constitutional: Negative for fever , positive for weight change.  Respiratory: Negative for cough and shortness of breath.   Cardiovascular: Negative for chest pain or palpitations.  Gastrointestinal: Negative for abdominal pain, no bowel changes.  Musculoskeletal: Negative for gait problem or joint swelling. she has noticed right heel pain and foot pain, also continues to have left shoulder pain  Skin: Negative for rash.  Neurological: Negative for dizziness or headache.  No other specific complaints in a complete review of systems (except as listed in HPI above).  Objective  Vitals:   09/09/20 1508  BP: 126/78  Pulse: 84  Resp: 16  Temp: 98.2 F (36.8  C)  TempSrc: Oral  SpO2: 98%  Weight: 249 lb 12.8 oz (113.3 kg)  Height: _0  (1.549 m)    Body mass index is 47.2 kg/m.  Physical Exam  Constitutional: Patient appears well-developed and morbid obese. No distress.  HENT: Head: Normocephalic and atraumatic. Ears: B TMs ok, no erythema or effusion; Nose: Not done. Mouth/Throat: not done  Eyes:  Conjunctivae and EOM are normal. Pupils are equal, round, and reactive to light. No scleral icterus.  Neck: Normal range of motion. Neck supple. No JVD present. No thyromegaly present.  Cardiovascular: Normal rate, regular rhythm and normal heart sounds.  No murmur heard. No BLE edema. Pulmonary/Chest: Effort normal and breath sounds normal. No respiratory distress. Abdominal: Soft. Bowel sounds are normal, no distension. There is no tenderness. no masses Breast: no lumps or masses, no nipple discharge or rashes FEMALE GENITALIA:  Not done  RECTAL: not done Musculoskeletal: pain with rom of left shoulder, pain and nodule on right arch painful to touch, pain on right heel during palpation  Neurological: he is alert and oriented to person, place, and time. No cranial nerve deficit. Coordination, balance, strength, speech and gait are normal.  Skin: Skin is warm and dry. No rash noted. No erythema.  Psychiatric: Patient has a normal mood and affect. behavior is normal. Judgment and thought content normal.   Recent Results (from the past 2160 hour(s))  POCT HgB A1C     Status: Abnormal   Collection Time: 09/09/20  3:17 PM  Result Value Ref Range   Hemoglobin A1C 5.8 (A) 4.0 - 5.6 %   HbA1c POC (<> result, manual entry)     HbA1c, POC (prediabetic range)     HbA1c, POC (controlled diabetic range)       Fall Risk: Fall Risk  09/09/2020 05/30/2020 04/24/2020 01/26/2020 10/25/2019  Falls in the past year? 1 1 0 0 0  Number falls in past yr: 0 1 0 0 0  Injury with Fall? 1 0 0 0 0  Risk for fall due to : History of fall(s) History of fall(s) - - -  Follow up - Falls evaluation completed;Education provided - - -     Functional Status Survey: Is the patient deaf or have difficulty hearing?: No Does the patient have difficulty seeing, even when wearing glasses/contacts?: No Does the patient have difficulty concentrating, remembering, or making decisions?: No Does the patient have difficulty  walking or climbing stairs?: No Does the patient have difficulty dressing or bathing?: No Does the patient have difficulty doing errands alone such as visiting a doctor's office or shopping?: No   Assessment & Plan  1. Well adult health check   2. Type 2 diabetes mellitus with microalbuminuria, without long-term current use of insulin (HCC)  - POCT HgB A1C  3. Need for hepatitis A vaccination  today  4. Need for shingles vaccine  - Varicella-zoster vaccine IM  5. Encounter for screening mammogram for malignant neoplasm of breast  - MM 3D SCREEN BREAST BILATERAL; Future -USPSTF grade A and B recommendations reviewed with patient; age-appropriate recommendations, preventive care, screening tests, etc discussed and encouraged; healthy living encouraged; see AVS for patient education given to patient -Discussed importance of 150 minutes of physical activity weekly, eat two servings of fish weekly, eat one serving of tree nuts ( cashews, pistachios, pecans, almonds.Marland Kitchen) every other day, eat 6 servings of fruit/vegetables daily and drink plenty of water and avoid sweet beverages.

## 2020-09-09 ENCOUNTER — Encounter: Payer: Self-pay | Admitting: Family Medicine

## 2020-09-09 ENCOUNTER — Ambulatory Visit (INDEPENDENT_AMBULATORY_CARE_PROVIDER_SITE_OTHER): Payer: BC Managed Care – PPO | Admitting: Family Medicine

## 2020-09-09 VITALS — BP 126/78 | HR 84 | Temp 98.2°F | Resp 16 | Ht 61.0 in | Wt 249.8 lb

## 2020-09-09 DIAGNOSIS — Z23 Encounter for immunization: Secondary | ICD-10-CM | POA: Diagnosis not present

## 2020-09-09 DIAGNOSIS — Z1231 Encounter for screening mammogram for malignant neoplasm of breast: Secondary | ICD-10-CM

## 2020-09-09 DIAGNOSIS — Z0000 Encounter for general adult medical examination without abnormal findings: Secondary | ICD-10-CM | POA: Diagnosis not present

## 2020-09-09 DIAGNOSIS — R809 Proteinuria, unspecified: Secondary | ICD-10-CM

## 2020-09-09 DIAGNOSIS — Z1211 Encounter for screening for malignant neoplasm of colon: Secondary | ICD-10-CM

## 2020-09-09 DIAGNOSIS — E1129 Type 2 diabetes mellitus with other diabetic kidney complication: Secondary | ICD-10-CM

## 2020-09-09 LAB — POCT GLYCOSYLATED HEMOGLOBIN (HGB A1C): Hemoglobin A1C: 5.8 % — AB (ref 4.0–5.6)

## 2020-09-09 MED ORDER — HEPATITIS A VACCINE 1440 EL U/ML IM SUSP
1.0000 mL | Freq: Once | INTRAMUSCULAR | Status: AC
Start: 2020-09-09 — End: 2020-09-09
  Administered 2020-09-09: 1440 [IU] via INTRAMUSCULAR

## 2020-09-09 NOTE — Patient Instructions (Signed)
Preventive Care 54-54 Years Old, Female Preventive care refers to lifestyle choices and visits with your health care provider that can promote health and wellness. This includes:  A yearly physical exam. This is also called an annual wellness visit.  Regular dental and eye exams.  Immunizations.  Screening for certain conditions.  Healthy lifestyle choices, such as: ? Eating a healthy diet. ? Getting regular exercise. ? Not using drugs or products that contain nicotine and tobacco. ? Limiting alcohol use. What can I expect for my preventive care visit? Physical exam Your health care provider will check your:  Height and weight. These may be used to calculate your BMI (body mass index). BMI is a measurement that tells if you are at a healthy weight.  Heart rate and blood pressure.  Body temperature.  Skin for abnormal spots. Counseling Your health care provider may ask you questions about your:  Past medical problems.  Family's medical history.  Alcohol, tobacco, and drug use.  Emotional well-being.  Home life and relationship well-being.  Sexual activity.  Diet, exercise, and sleep habits.  Work and work Statistician.  Access to firearms.  Method of birth control.  Menstrual cycle.  Pregnancy history. What immunizations do I need? Vaccines are usually given at various ages, according to a schedule. Your health care provider will recommend vaccines for you based on your age, medical history, and lifestyle or other factors, such as travel or where you work.   What tests do I need? Blood tests  Lipid and cholesterol levels. These may be checked every 5 years, or more often if you are over 54 years old.  Hepatitis C test.  Hepatitis B test. Screening  Lung cancer screening. You may have this screening every year starting at age 54 if you have a 30-pack-year history of smoking and currently smoke or have quit within the past 15 years.  Colorectal cancer  screening. ? All adults should have this screening starting at age 54 and continuing until age 17. ? Your health care provider may recommend screening at age 54 if you are at increased risk. ? You will have tests every 1-10 years, depending on your results and the type of screening test.  Diabetes screening. ? This is done by checking your blood sugar (glucose) after you have not eaten for a while (fasting). ? You may have this done every 1-3 years.  Mammogram. ? This may be done every 1-2 years. ? Talk with your health care provider about when you should start having regular mammograms. This may depend on whether you have a family history of breast cancer.  BRCA-related cancer screening. This may be done if you have a family history of breast, ovarian, tubal, or peritoneal cancers.  Pelvic exam and Pap test. ? This may be done every 3 years starting at age 54. ? Starting at age 11, this may be done every 5 years if you have a Pap test in combination with an HPV test. Other tests  STD (sexually transmitted disease) testing, if you are at risk.  Bone density scan. This is done to screen for osteoporosis. You may have this scan if you are at high risk for osteoporosis. Talk with your health care provider about your test results, treatment options, and if necessary, the need for more tests. Follow these instructions at home: Eating and drinking  Eat a diet that includes fresh fruits and vegetables, whole grains, lean protein, and low-fat dairy products.  Take vitamin and mineral supplements  as recommended by your health care provider.  Do not drink alcohol if: ? Your health care provider tells you not to drink. ? You are pregnant, may be pregnant, or are planning to become pregnant.  If you drink alcohol: ? Limit how much you have to 0-1 drink a day. ? Be aware of how much alcohol is in your drink. In the [place] one drink equals one 12 oz bottle of beer (355 mL), one 5 oz glass of  wine (148 mL), or one 1 oz glass of hard liquor (44 mL).   Lifestyle  Take daily care of your teeth and gums. Brush your teeth every morning and night with fluoride toothpaste. Floss one time each day.  Stay active. Exercise for at least 30 minutes 5 or more days each week.  Do not use any products that contain nicotine or tobacco, such as cigarettes, e-cigarettes, and chewing tobacco. If you need help quitting, ask your health care provider.  Do not use drugs.  If you are sexually active, practice safe sex. Use a condom or other form of protection to prevent STIs (sexually transmitted infections).  If you do not wish to become pregnant, use a form of birth control. If you plan to become pregnant, see your health care provider for a prepregnancy visit.  If told by your health care provider, take low-dose aspirin daily starting at age 50.  Find healthy ways to cope with stress, such as: ? Meditation, yoga, or listening to music. ? Journaling. ? Talking to a trusted person. ? Spending time with friends and family. Safety  Always wear your seat belt while driving or riding in a vehicle.  Do not drive: ? If you have been drinking alcohol. Do not ride with someone who has been drinking. ? When you are tired or distracted. ? While texting.  Wear a helmet and other protective equipment during sports activities.  If you have firearms in your house, make sure you follow all gun safety procedures. What's next?  Visit your health care provider once a year for an annual wellness visit.  Ask your health care provider how often you should have your eyes and teeth checked.  Stay up to date on all vaccines. This information is not intended to replace advice given to you by your health care provider. Make sure you discuss any questions you have with your health care provider. Document Revised: 04/23/2020 Document Reviewed: 03/31/2018 Elsevier Patient Education  2021 Elsevier Inc.  

## 2020-10-10 ENCOUNTER — Other Ambulatory Visit
Admission: RE | Admit: 2020-10-10 | Discharge: 2020-10-10 | Disposition: A | Discharge status: Home / Self Care | Payer: BC Managed Care – PPO | Source: Ambulatory Visit | Attending: Gastroenterology | Admitting: Gastroenterology

## 2020-10-10 ENCOUNTER — Other Ambulatory Visit: Payer: Self-pay

## 2020-10-10 DIAGNOSIS — Z20822 Contact with and (suspected) exposure to covid-19: Secondary | ICD-10-CM | POA: Diagnosis not present

## 2020-10-10 DIAGNOSIS — Z01812 Encounter for preprocedural laboratory examination: Secondary | ICD-10-CM | POA: Diagnosis not present

## 2020-10-10 LAB — SARS CORONAVIRUS 2 (TAT 6-24 HRS): SARS Coronavirus 2: NEGATIVE

## 2020-10-11 ENCOUNTER — Encounter: Payer: Self-pay | Admitting: *Deleted

## 2020-10-14 ENCOUNTER — Other Ambulatory Visit: Payer: Self-pay

## 2020-10-14 ENCOUNTER — Ambulatory Visit
Admission: RE | Admit: 2020-10-14 | Discharge: 2020-10-14 | Disposition: A | Discharge status: Home / Self Care | Payer: BC Managed Care – PPO | Source: Ambulatory Visit | Attending: Gastroenterology | Admitting: Gastroenterology

## 2020-10-14 ENCOUNTER — Ambulatory Visit: Payer: BC Managed Care – PPO | Admitting: Certified Registered Nurse Anesthetist

## 2020-10-14 ENCOUNTER — Encounter: Admission: RE | Payer: Self-pay | Source: Home / Self Care

## 2020-10-14 ENCOUNTER — Encounter
Admission: RE | Disposition: A | Discharge status: Home / Self Care | Payer: Self-pay | Source: Ambulatory Visit | Attending: Gastroenterology

## 2020-10-14 ENCOUNTER — Encounter: Payer: Self-pay | Admitting: *Deleted

## 2020-10-14 DIAGNOSIS — Z91040 Latex allergy status: Secondary | ICD-10-CM | POA: Insufficient documentation

## 2020-10-14 DIAGNOSIS — I10 Essential (primary) hypertension: Secondary | ICD-10-CM | POA: Diagnosis not present

## 2020-10-14 DIAGNOSIS — Z09 Encounter for follow-up examination after completed treatment for conditions other than malignant neoplasm: Secondary | ICD-10-CM | POA: Diagnosis not present

## 2020-10-14 DIAGNOSIS — Z79899 Other long term (current) drug therapy: Secondary | ICD-10-CM | POA: Insufficient documentation

## 2020-10-14 DIAGNOSIS — D123 Benign neoplasm of transverse colon: Secondary | ICD-10-CM | POA: Diagnosis not present

## 2020-10-14 DIAGNOSIS — D120 Benign neoplasm of cecum: Secondary | ICD-10-CM | POA: Insufficient documentation

## 2020-10-14 DIAGNOSIS — K635 Polyp of colon: Secondary | ICD-10-CM | POA: Diagnosis not present

## 2020-10-14 DIAGNOSIS — K5790 Diverticulosis of intestine, part unspecified, without perforation or abscess without bleeding: Secondary | ICD-10-CM | POA: Diagnosis not present

## 2020-10-14 DIAGNOSIS — Z9884 Bariatric surgery status: Secondary | ICD-10-CM | POA: Insufficient documentation

## 2020-10-14 DIAGNOSIS — D122 Benign neoplasm of ascending colon: Secondary | ICD-10-CM | POA: Diagnosis not present

## 2020-10-14 DIAGNOSIS — Z1211 Encounter for screening for malignant neoplasm of colon: Secondary | ICD-10-CM | POA: Diagnosis not present

## 2020-10-14 DIAGNOSIS — K5730 Diverticulosis of large intestine without perforation or abscess without bleeding: Secondary | ICD-10-CM | POA: Insufficient documentation

## 2020-10-14 HISTORY — DX: Adenomyosis of the uterus: N80.03

## 2020-10-14 HISTORY — PX: COLONOSCOPY WITH PROPOFOL: SHX5780

## 2020-10-14 HISTORY — DX: Calculus of kidney: N20.0

## 2020-10-14 HISTORY — DX: Endometriosis of uterus: N80.0

## 2020-10-14 HISTORY — DX: Personal history of other endocrine, nutritional and metabolic disease: Z86.39

## 2020-10-14 HISTORY — DX: Prediabetes: R73.03

## 2020-10-14 HISTORY — DX: Unspecified lump in unspecified breast: N63.0

## 2020-10-14 HISTORY — DX: Endometriosis of the uterus, unspecified: N80.00

## 2020-10-14 HISTORY — DX: Personal history of urinary calculi: Z87.442

## 2020-10-14 SURGERY — COLONOSCOPY WITH PROPOFOL
Anesthesia: General

## 2020-10-14 MED ORDER — LIDOCAINE HCL (CARDIAC) PF 100 MG/5ML IV SOSY
PREFILLED_SYRINGE | INTRAVENOUS | Status: DC | PRN
  Administered 2020-10-14: 50 mg via INTRAVENOUS

## 2020-10-14 MED ORDER — SODIUM CHLORIDE 0.9 % IV SOLN: INTRAVENOUS | Status: DC

## 2020-10-14 MED ORDER — PROPOFOL 10 MG/ML IV BOLUS
INTRAVENOUS | Status: DC | PRN
  Administered 2020-10-14: 50 mg via INTRAVENOUS
  Administered 2020-10-14 (×2): 20 mg via INTRAVENOUS
  Administered 2020-10-14: 50 mg via INTRAVENOUS
  Administered 2020-10-14: 20 mg via INTRAVENOUS

## 2020-10-14 MED ORDER — LIDOCAINE HCL (PF) 1 % IJ SOLN
INTRAMUSCULAR | Status: AC
  Administered 2020-10-14: 0.05 mL
  Filled 2020-10-14: qty 2

## 2020-10-14 MED ORDER — PROPOFOL 500 MG/50ML IV EMUL
INTRAVENOUS | Status: DC | PRN
  Administered 2020-10-14: 125 ug/kg/min via INTRAVENOUS

## 2020-10-14 NOTE — Transfer of Care (Signed)
Immediate Anesthesia Transfer of Care Note  Patient: [facility]  Procedure(s) Performed: COLONOSCOPY WITH PROPOFOL (N/A )  Patient Location: PACU  Anesthesia Type:General  Level of Consciousness: awake, alert  and oriented  Airway & Oxygen Therapy: Patient Spontanous Breathing and Patient connected to nasal cannula oxygen  Post-op Assessment: Report given to RN and Post -op Vital signs reviewed and stable  Post vital signs: Reviewed and stable  Last Vitals:  Vitals Value Taken Time  BP    Temp    Pulse 95 10/14/20 1119  Resp 19 10/14/20 1119  SpO2 100 % 10/14/20 1119  Vitals shown include unvalidated device data.  Last Pain:  Vitals:   10/14/20 0928  TempSrc: Temporal  PainSc: 0-No pain         Complications: No complications documented.

## 2020-10-14 NOTE — Op Note (Addendum)
[facility] [address] [facility] Patient Name: Debra Contreras Procedure Date: 10/14/2020 10:12 AM MRN: [id number] Account #: [id number] Date of Birth: 1966-07-08 Admit Type: Outpatient Age: 55 Room: [facility] ENDO ROOM 3 Gender: Female Note Status: Supervisor Override Procedure:             Colonoscopy Indications:           Screening colon cancer Providers:             Andrey Farmer MD, MD Referring MD:          Bethena Roys. Sowles, MD (Referring MD) Medicines:             Monitored Anesthesia Care Complications:         No immediate complications. Estimated blood loss:                         Minimal. Procedure:             Pre-Anesthesia Assessment:                        - Prior to the procedure, a History and Physical was                         performed, and patient medications and allergies were                         reviewed. The patient is competent. The risks and                         benefits of the procedure and the sedation options and                         risks were discussed with the patient. All questions                         were answered and informed consent was obtained.                         Patient identification and proposed procedure were                         verified by the physician, the nurse, the anesthetist                         and the technician in the endoscopy suite. Mental                         Status Examination: alert and oriented. Airway                         Examination: normal oropharyngeal airway and neck                         mobility. Respiratory Examination: clear to                         auscultation. CV Examination: normal. Prophylactic  Antibiotics: The patient does not require prophylactic                         antibiotics. Prior Anticoagulants: The patient has                         taken no previous anticoagulant or antiplatelet                         agents. ASA Grade  Assessment: III - A patient with                         severe systemic disease. After reviewing the risks and                         benefits, the patient was deemed in satisfactory                         condition to undergo the procedure. The anesthesia                         plan was to use monitored anesthesia care (MAC).                         Immediately prior to administration of medications,                         the patient was re-assessed for adequacy to receive                         sedatives. The heart rate, respiratory rate, oxygen                         saturations, blood pressure, adequacy of pulmonary                         ventilation, and response to care were monitored                         throughout the procedure. The physical status of the                         patient was re-assessed after the procedure.                        After obtaining informed consent, the colonoscope was                         passed under direct vision. Throughout the procedure,                         the patient's blood pressure, pulse, and oxygen                         saturations were monitored continuously. The                         Colonoscope was introduced through the anus and  advanced to the the cecum, identified by appendiceal                         orifice and ileocecal valve. The colonoscopy was                         technically difficult and complex due to significant                         looping. Successful completion of the procedure was                         aided by applying abdominal pressure. The patient                         tolerated the procedure well. The quality of the bowel                         preparation was good. Findings:      The perianal and digital rectal examinations were normal.      A 13 mm polyp was found in the cecum. The polyp was sessile. The polyp       was removed with a hot snare. Resection  and retrieval were complete.       Estimated blood loss was minimal. To prevent bleeding after the       polypectomy, one hemostatic clip was successfully placed. There was no       bleeding during, or at the end, of the procedure. This polyp corresponds       to polyp that was not removed 4 years ago.      A 10 mm polyp was found in the ileocecal valve. The polyp was sessile.       The polyp was removed with a hot snare. Resection and retrieval were       complete. This seems to be the same location as here previously       documented 3 cm polyp that was removed      A 27 mm polyp was found in the ascending colon. The polyp was sessile.       The polyp was removed with a piecemeal technique using a hot snare.       Resection and retrieval were thought to be complete. Edges of the       resulting polypectomy were burned with the tip of the hot snare. To       prevent bleeding after the polypectomy, three hemostatic clips were       successfully placed. There was no bleeding during, or at the end, of the       procedure. This polyp corresponds to ascending colon polyp that was not       removed 4 years ago.      A 12 mm polyp was found in the hepatic flexure. The polyp was       semi-pedunculated. The polyp was removed with a hot snare. Resection and       retrieval were complete. To prevent bleeding post-intervention, one       hemostatic clip was successfully placed. There was no bleeding during,       or at the end, of the procedure.      Multiple small-mouthed diverticula were found in the sigmoid  colon,       descending colon, transverse colon and ascending colon. Impression:            - One 13 mm polyp in the cecum, removed with a hot                         snare. Resected and retrieved. Clip was placed.                        - One 10 mm polyp at the ileocecal valve, removed with                         a hot snare. Resected and retrieved.                        - One 27 mm polyp  in the ascending colon, removed                         piecemeal using a hot snare. Resected and retrieved.                         Clips were placed.                        - One 12 mm polyp at the hepatic flexure, removed with                         a hot snare. Resected and retrieved. Clip was placed.                        - Diverticulosis in the sigmoid colon, in the                         descending colon, in the transverse colon and in the                         ascending colon. Recommendation:        - Discharge patient to home.                        - Resume previous diet.                        - Continue present medications.                        - Await pathology results.                        - Repeat colonoscopy in 6 months for surveillance                         after piecemeal polypectomy.                        - Return to referring physician as previously                         scheduled. Procedure Code(s):     --- Professional ---  45385, Colonoscopy, flexible; with removal of                         tumor(s), polyp(s), or other lesion(s) by snare                         technique Diagnosis Code(s):     --- Professional ---                        K63.5, Polyp of colon                        Z86.010, Personal history of colonic polyps                        K57.30, Diverticulosis of large intestine without                         perforation or abscess without bleeding CPT copyright 2019 [employer]. All rights reserved. The codes documented in this report are preliminary and upon coder review may  be revised to meet current compliance requirements. Andrey Farmer MD, MD 10/14/2020 11:31:34 AM Number of Addenda: 0 Note Initiated On: 10/14/2020 10:12 AM Scope Withdrawal Time: 0 hours 37 minutes 35 seconds  Total Procedure Duration: 0 hours 47 minutes 9 seconds  Estimated Blood Loss:  Estimated blood loss was minimal.       [facility]

## 2020-10-14 NOTE — Anesthesia Preprocedure Evaluation (Signed)
Anesthesia Evaluation  Patient identified by MRN, date of birth, ID band Patient awake    Reviewed: Allergy & Precautions, H&P , NPO status , Patient's Chart, lab work & pertinent test results  History of Anesthesia Complications Negative for: history of anesthetic complications  Airway Mallampati: III  TM Distance: >3 FB Neck ROM: full    Dental  (+) Chipped   Pulmonary neg shortness of breath, sleep apnea ,    Pulmonary exam normal        Cardiovascular Exercise Tolerance: Good hypertension, (-) angina(-) Past MI Normal cardiovascular exam     Neuro/Psych negative neurological ROS  negative psych ROS   GI/Hepatic Neg liver ROS, hiatal hernia, GERD  Medicated and Controlled,  Endo/Other  diabetes, Type 2  Renal/GU Renal disease  negative genitourinary   Musculoskeletal   Abdominal   Peds  Hematology negative hematology ROS (+)   Anesthesia Other Findings Past Medical History: 03/09/2015: Acanthosis nigricans 03/09/2015: Allergic rhinitis No date: Allergy 10/142016: Breast lump in female No date: Calculus of kidney No date: GERD (gastroesophageal reflux disease) 03/09/2015: Hiatal hernia 03/09/2015: History of anemia No date: History of kidney stones No date: History of PCOS No date: History of thyroid nodule No date: Hyperlipidemia No date: Hypertension No date: Kidney stones No date: Microalbuminuria     Comment:  DM 05/29/2014: Microalbuminuria 06/04/2015: Morbid obesity ([place]) No date: Pre-diabetes No date: Sleep apnea No date: Snoring No date: Uterus, adenomyosis No date: Vitamin D deficiency 03/09/2015: Vitamin D deficiency  Past Surgical History: 03/2012: ABDOMINAL HYSTERECTOMY 12/2005: BREAST BIOPSY; Right     Comment:  Normal-Cyst 12/2005: BREAST BIOPSY; Right No date: BREAST REDUCTION SURGERY 1990: BREAST SURGERY; Bilateral     Comment:  Reduction 1990: BREAST SURGERY;  Bilateral 05/07/2017: COLONOSCOPY WITH PROPOFOL; N/A     Comment:  Procedure: COLONOSCOPY WITH PROPOFOL;  Surgeon: Jonathon Bellows, MD;  Location[facility] ENDOSCOPY;  Service:               Gastroenterology;  Laterality: N/A; 03/2011: OOPHORECTOMY; Left No date: REDUCTION MAMMAPLASTY; Bilateral No date: REDUCTION MAMMAPLASTY; Bilateral 09/2016: SLEEVE GASTROPLASTY     Comment:  [place] 10/08/2015: thyroidectomy; Left  BMI    Body Mass Index: 47.05 kg/m      Reproductive/Obstetrics negative OB ROS                             Anesthesia Physical Anesthesia Plan  ASA: III  Anesthesia Plan: General   Post-op Pain Management:    Induction: Intravenous  PONV Risk Score and Plan: Propofol infusion and TIVA  Airway Management Planned: Natural Airway and Nasal Cannula  Additional Equipment:   Intra-op Plan:   Post-operative Plan:   Informed Consent: I have reviewed the patients History and Physical, chart, labs and discussed the procedure including the risks, benefits and alternatives for the proposed anesthesia with the patient or authorized representative who has indicated his/her understanding and acceptance.     Dental Advisory Given  Plan Discussed with: Anesthesiologist, CRNA and Surgeon  Anesthesia Plan Comments: (Patient consented for risks of anesthesia including but not limited to:  - adverse reactions to medications - risk of airway placement if required - damage to eyes, teeth, lips or other oral mucosa - nerve damage due to positioning  - sore throat or hoarseness - Damage to heart, brain, nerves, lungs, other parts of body  or loss of life  Patient voiced understanding.)        Anesthesia Quick Evaluation

## 2020-10-14 NOTE — H&P (Signed)
Outpatient short stay form Pre-procedure 10/14/2020 10:17 AM Raylene Miyamoto MD, MPH  Primary Physician: Dr. Ancil Boozer  Reason for visit:  Hx of polyps  History of present illness:   55 y/o lady that had a very large polyp removed 4 years ago but per report had two 10-12 mm polyps remaining that were not removed due to colon spasm. Patient was supposed to follow-up a few months later but she never did. History of hysterectomy and sleeve gastrectomy. No known family history of GI malignancies. No blood thinners.    Current Facility-Administered Medications:  .  0.9 %  sodium chloride infusion, , Intravenous, Continuous, Garin Mata, Hilton Cork, MD, Last Rate: 20 mL/hr at 10/14/20 0959, New Bag at 10/14/20 0959  Medications Prior to Admission  Medication Sig Dispense Refill Last Dose  . Biotin 5000 MCG TABS Take 5,000 mg by mouth 3 (three) times a week.   Past Week at Unknown time  . Cholecalciferol (VITAMIN D) 2000 units CAPS Take 1 capsule (2,000 Units total) by mouth daily. 30 capsule  Past Week at Unknown time  . ELDERBERRY PO Take by mouth.   Past Week at Unknown time  . Flaxseed, Linseed, (FLAXSEED OIL PO) Take by mouth.   Past Week at Unknown time  . fluticasone (FLONASE) 50 MCG/ACT nasal spray Place 2 sprays into both nostrils daily. 16 g 6 Past Week at Unknown time  . hydrocortisone cream 1 % Apply 1 application topically as needed for itching.   Past Week at Unknown time  . lisinopril (ZESTRIL) 10 MG tablet Take 1 tablet (10 mg total) by mouth daily. 90 tablet 0 10/14/2020 at 0800  . loratadine (CLARITIN) 10 MG tablet Take by mouth.   Past Week at Unknown time  . Multiple Vitamin (MULTIVITAMIN) tablet Take 1 tablet by mouth daily.   Past Week at Unknown time  . rosuvastatin (CRESTOR) 5 MG tablet Take 1 tablet (5 mg total) by mouth daily. 90 tablet 0 Past Week at Unknown time  . Thiamine HCl (VITAMIN B-1 PO) Take 500 mg by mouth daily.   Past Week at Unknown time  . Turmeric 500 MG CAPS  Take by mouth.   Past Week at Unknown time  . vitamin B-12 (CYANOCOBALAMIN) 1000 MCG tablet Take 1,000 mcg by mouth 2 (two) times a week.   Past Week at Unknown time  . vitamin C (ASCORBIC ACID) 500 MG tablet Take 500 mg by mouth daily.   Past Week at Unknown time  . triamcinolone cream (KENALOG) 0.1 % Apply 1 application topically 2 (two) times daily. (Patient not taking: Reported on 10/14/2020) 30 g 0 Not Taking at Unknown time     Allergies  Allergen Reactions  . Zinc     Nausea, vomiting and diarrhea   . Latex Rash     Past Medical History:  Diagnosis Date  . Acanthosis nigricans 03/09/2015  . Allergic rhinitis 03/09/2015  . Allergy   . Breast lump in female 10/142016  . Calculus of kidney   . GERD (gastroesophageal reflux disease)   . Hiatal hernia 03/09/2015  . History of anemia 03/09/2015  . History of kidney stones   . History of PCOS   . History of thyroid nodule   . Hyperlipidemia   . Hypertension   . Kidney stones   . Microalbuminuria    DM  . Microalbuminuria 05/29/2014  . Morbid obesity ([place]) 06/04/2015  . Pre-diabetes   . Sleep apnea   . Snoring   . Uterus,  adenomyosis   . Vitamin D deficiency   . Vitamin D deficiency 03/09/2015    Review of systems:  Otherwise negative.    Physical Exam  Gen: Alert, oriented. Appears stated age.  HEENT: PERRLA. Lungs: No respiratory distress CV: RRR Abd: soft, benign, no masses Ext: No edema    Planned procedures: Proceed with colonoscopy. The patient understands the nature of the planned procedure, indications, risks, alternatives and potential complications including but not limited to bleeding, infection, perforation, damage to internal organs and possible oversedation/side effects from anesthesia. The patient agrees and gives consent to proceed.  Please refer to procedure notes for findings, recommendations and patient disposition/instructions.     Raylene Miyamoto MD, MPH Gastroenterology 10/14/2020   10:17 AM

## 2020-10-14 NOTE — Interval H&P Note (Signed)
History and Physical Interval Note:  10/14/2020 10:20 AM  Debra Contreras  has presented today for surgery, with the diagnosis of SCREENING.  The various methods of treatment have been discussed with the patient and family. After consideration of risks, benefits and other options for treatment, the patient has consented to  Procedure(s): COLONOSCOPY WITH PROPOFOL (N/A) as a surgical intervention.  The patient's history has been reviewed, patient examined, no change in status, stable for surgery.  I have reviewed the patient's chart and labs.  Questions were answered to the patient's satisfaction.     Lesly Rubenstein  Ok to proceed with colonoscopy

## 2020-10-14 NOTE — Anesthesia Postprocedure Evaluation (Signed)
Anesthesia Post Note  Patient: Debra Contreras  Procedure(s) Performed: COLONOSCOPY WITH PROPOFOL (N/A )  Patient location during evaluation: Endoscopy Anesthesia Type: General Level of consciousness: awake and alert Pain management: pain level controlled Vital Signs Assessment: post-procedure vital signs reviewed and stable Respiratory status: spontaneous breathing, nonlabored ventilation, respiratory function stable and patient connected to nasal cannula oxygen Cardiovascular status: blood pressure returned to baseline and stable Postop Assessment: no apparent nausea or vomiting Anesthetic complications: no   No complications documented.   Last Vitals:  Vitals:   10/14/20 1140 10/14/20 1150  BP: 112/74 111/81  Pulse: 74 67  Resp: 16 16  Temp:    SpO2: 99% 100%    Last Pain:  Vitals:   10/14/20 1140  TempSrc:   PainSc: 0-No pain                 Precious Haws Nilam Quakenbush

## 2020-10-15 ENCOUNTER — Encounter: Payer: Self-pay | Admitting: Gastroenterology

## 2020-10-15 LAB — SURGICAL PATHOLOGY

## 2020-10-21 LAB — HM COLONOSCOPY

## 2020-10-29 NOTE — Progress Notes (Signed)
Name: Debra Contreras   MRN: [id number]    DOB: Jan 04, 1966   Date:10/30/2020       Progress Note  Subjective  Chief Complaint  Follow up  HPI    Thyroid nodule: she was sitting on her desk working on a report and felt a large nodule on the left side back in October 2016, she had a partial thyroidectomy ( removal of left side) in March 2017, and is doing well since. Last TSH was at goal; we will recheck level today. She denies palpations , skin changes.   DMII with renal manifestation, gingivitis, obesity, dyslipidemia: no longer taking Metforminbut still takes Lisinopril 10 mg for proteinuria, urine micro was elevated in the past at50 and we will recheck it today. She is not checking glucose at home.  She denies polyphagia, polydipsia or polyuria. Her A1C was 5.6 %up to  6.1 % and last visit was down to 5.8 % . She is on Crestor and we will check lipid panel . 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 Weight went down to 186 lbs ( lowest after bariatric surgery ). Unchanged.   HTN:she is compliant with medication, denies chest pain, palpitation or dizziness. BP is at goal.   Dyslipidemia: she is taking  5 mg Crestor and denies myalgia. Last LDL was 116, goal is below 70 , she will have labs done today   OSA:  Symptoms improved with weight loss, but she has gained it back,  she wakes up feeling tired, she was supposed to have a sleep study done, she is waiting to meet her deductible and will let me know when ready.  Frozen Shoulder:  she states she woke up about 6  months ago with left shoulder pain, she has decrease rom of left shoulder, worse with abduction, pain is worse on left  anterior shoulder. Pain is described as sharp, tender to touch. Sometimes radiates towards her neck but never down her left arm. She will go back to Ortho   Colon polyps: she will go back in 6 months for repeat colonoscopy   Patient Active Problem List   Diagnosis Date Noted  . 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 History of partial thyroidectomy 10/05/2015  . H/O thyroid nodule 10/03/2015  . Morbid obesity ([place]) 06/04/2015  . Breast lump in female 05/17/2015  . Acanthosis nigricans 03/09/2015  . Allergic rhinitis, seasonal 03/09/2015  . Diabetes mellitus with renal manifestation ([place]) 03/09/2015  . Dyslipidemia 03/09/2015  . Gastro-esophageal reflux disease without esophagitis 03/09/2015  . Hiatal hernia 03/09/2015  . History of anemia 03/09/2015  . H/O: hysterectomy 03/09/2015  . Uterus, adenomyosis 03/09/2015  . Calculus of kidney 03/09/2015  . Benign hypertension 03/09/2015  . Extreme obesity 03/09/2015  . Vitamin D deficiency 03/09/2015  . Apnea, sleep 09/12/2014  . Microalbuminuria 05/29/2014    Past Surgical History:  Procedure Laterality Date  . ABDOMINAL HYSTERECTOMY  03/2012  . BREAST BIOPSY Right 12/2005   Normal-Cyst  . BREAST BIOPSY Right 12/2005  . BREAST REDUCTION SURGERY    . BREAST SURGERY Bilateral 1990   Reduction  . BREAST SURGERY Bilateral 1990  . COLONOSCOPY WITH PROPOFOL N/A 05/07/2017  Procedure: COLONOSCOPY WITH PROPOFOL;  Surgeon: Jonathon Bellows, MD;  [facility] ENDOSCOPY;  Service: Gastroenterology;  Laterality: N/A;  . COLONOSCOPY WITH PROPOFOL N/A 10/14/2020   Procedure: COLONOSCOPY WITH PROPOFOL;  Surgeon: Lesly Rubenstein, MD;  Location: [facility] ENDOSCOPY;  Service: Endoscopy;  Laterality: N/A;  . OOPHORECTOMY Left 03/2011  . REDUCTION MAMMAPLASTY Bilateral   . REDUCTION MAMMAPLASTY Bilateral   . SLEEVE GASTROPLASTY  09/2016   [place]  . thyroidectomy Left 10/08/2015    Family History  Problem Relation Age of Onset  . Hypertension  Mother   . Cirrhosis Mother   . COPD Mother        Heavy Smoker  . Hepatitis C Mother   . Alcohol abuse Father   . Heart disease Father   . Heart attack Father   . Diabetes Brother   . Obesity Brother   . Lung cancer Maternal Grandmother        Snuff  . Diabetes Maternal Grandmother   . Alcoholism Maternal Grandmother   . Prostate cancer Paternal Grandfather   . Alcoholism Maternal Grandfather   . Diabetes Maternal Grandfather   . Breast cancer Cousin     Social History   Tobacco Use  . Smoking status: Never Smoker  . Smokeless tobacco: Never Used  Substance Use Topics  . Alcohol use: Yes    Alcohol/week: 0.0 standard drinks    Comment: wine-social drinker     Current Outpatient Medications:  .  Biotin 5000 MCG TABS, Take 5,000 mg by mouth 3 (three) times a week., Disp: , Rfl:  .  Cholecalciferol (VITAMIN D) 2000 units CAPS, Take 1 capsule (2,000 Units total) by mouth daily., Disp: 30 capsule, Rfl:  .  ELDERBERRY PO, Take by mouth., Disp: , Rfl:  .  Flaxseed, Linseed, (FLAXSEED OIL PO), Take by mouth., Disp: , Rfl:  .  fluticasone (FLONASE) 50 MCG/ACT nasal spray, Place 2 sprays into both nostrils daily., Disp: 16 g, Rfl: 6 .  hydrocortisone cream 1 %, Apply 1 application topically as needed for itching., Disp: , Rfl:  .  lisinopril (ZESTRIL) 10 MG tablet, Take 1 tablet (10 mg total) by mouth daily., Disp: 90 tablet, Rfl: 0 .  loratadine (CLARITIN) 10 MG tablet, Take by mouth., Disp: , Rfl:  .  Multiple Vitamin (MULTIVITAMIN) tablet, Take 1 tablet by mouth daily., Disp: , Rfl:  .  rosuvastatin (CRESTOR) 5 MG tablet, Take 1 tablet (5 mg total) by mouth daily., Disp: 90 tablet, Rfl: 0 .  Thiamine HCl (VITAMIN B-1 PO), Take 500 mg by mouth daily., Disp: , Rfl:  .  Turmeric 500 MG CAPS, Take by mouth., Disp: , Rfl:  .  vitamin B-12 (CYANOCOBALAMIN) 1000 MCG tablet, Take 1,000 mcg by mouth 2 (two) times a week., Disp: , Rfl:  .  vitamin C (ASCORBIC ACID) 500 MG tablet, Take  500 mg by mouth daily., Disp: , Rfl:  .  triamcinolone cream (KENALOG) 0.1 %, Apply 1 application topically 2 (two) times daily. (Patient not taking: No sig reported), Disp: 30 g, Rfl: 0  Allergies  Allergen Reactions  . Zinc     Nausea, vomiting and diarrhea   . Latex Rash    I personally reviewed active problem list, medication list, allergies, family history, social history, health maintenance with the patient/caregiver today.   ROS  Constitutional: Negative for fever or weight change.  Respiratory: Negative for cough and shortness of breath.   Cardiovascular: Negative for chest pain or palpitations.  Gastrointestinal: Negative  for abdominal pain, no bowel changes.  Musculoskeletal: Negative for gait problem or joint swelling.  Skin: Negative for rash.  Neurological: Negative for dizziness or headache.  No other specific complaints in a complete review of systems (except as listed in HPI above).  Objective  Vitals:   10/30/20 0806  BP: 130/82  Pulse: 94  Resp: 16  Temp: 98.4 F (36.9 C)  TempSrc: Oral  SpO2: 97%  Weight: 250 lb (113.4 kg)  Height: 5\' 1"  (1.549 m)    Body mass index is 47.24 kg/m.  Physical Exam  Constitutional: Patient appears well-developed and well-nourished. Obese  No distress.  HEENT: head atraumatic, normocephalic, pupils equal and reactive to light,  neck supple Cardiovascular: Normal rate, regular rhythm and normal heart sounds.  No murmur heard. No BLE edema. Pulmonary/Chest: Effort normal and breath sounds normal. No respiratory distress. Abdominal: Soft.  There is no tenderness. Psychiatric: Patient has a normal mood and affect. behavior is normal. Judgment and thought content normal.  Recent Results (from the past 2160 hour(s))  POCT HgB A1C     Status: Abnormal   Collection Time: 09/09/20  3:17 PM  Result Value Ref Range   Hemoglobin A1C 5.8 (A) 4.0 - 5.6 %   HbA1c POC (<> result, manual entry)     HbA1c, POC (prediabetic range)      HbA1c, POC (controlled diabetic range)    SARS CORONAVIRUS 2 (TAT 6-24 HRS) Nasopharyngeal Nasopharyngeal Swab     Status: None   Collection Time: 10/10/20  8:47 AM   Specimen: Nasopharyngeal Swab  Result Value Ref Range   SARS Coronavirus 2 NEGATIVE NEGATIVE    Comment: (NOTE) SARS-CoV-2 target nucleic acids are NOT DETECTED.  The SARS-CoV-2 RNA is generally detectable in upper and lower respiratory specimens during the acute phase of infection. Negative results do not preclude SARS-CoV-2 infection, do not rule out co-infections with other pathogens, and should not be used as the sole basis for treatment or other patient management decisions. Negative results must be combined with clinical observations, patient history, and epidemiological information. The expected result is Negative.  Fact Sheet for Patients: SugarRoll.be  Fact Sheet for Healthcare Providers: https://www.woods-mathews.com/  This test is not yet approved or cleared by the [place] FDA and  has been authorized for detection and/or diagnosis of SARS-CoV-2 by FDA under an Emergency Use Authorization (EUA). This EUA will remain  in effect (meaning this test can be used) for the duration of the COVID-19 declaration under Se ction 564(b)(1) of the Act, 21 U.S.C. section 360bbb-3(b)(1), unless the authorization is terminated or revoked sooner.  Performed at [facility], [place] [address] [place], [place] [address]   Surgical pathology     Status: None   Collection Time: 10/14/20 10:37 AM  Result Value Ref Range   SURGICAL PATHOLOGY      SURGICAL PATHOLOGY CASE: [id number] PATIENT: [facility] Surgical Pathology Report     Specimen Submitted: A. Colon polyp x3, cecum(2) asc(1); h snare B. Colon polyp, hepatic flexure; hot snare  Clinical History: Screening.  Diverticulosis, colon polyps      DIAGNOSIS: A. COLON POLYP X3, CECUM (2) AND  ASCENDING (1); HOT SNARE: - TUBULOVILLOUS ADENOMA (LARGEST FRAGMENT). - TUBULAR ADENOMA, NUMEROUS FRAGMENTS. - NEGATIVE FOR HIGH-GRADE DYSPLASIA AND MALIGNANCY.  B. COLON POLYP, HEPATIC FLEXURE; HOT SNARE: - TUBULAR ADENOMA. - NEGATIVE FOR HIGH-GRADE DYSPLASIA AND MALIGNANCY.   GROSS DESCRIPTION: A. Labeled: Hot snare cecal colon polyp x2, hot snare ascending colon polyp  x1 Received: Formalin Collection time: 10:37 AM on 10/14/2020 Placed into formalin time: 10:37 AM on 10/14/2020 Tissue fragment(s): Multiple Size: Aggregate, 2.5 x 2.5 x 1.1 cm Description: Received are fragments of tan-pink soft tissue admixed with intestinal debris.  The rati o of soft tissue to intestinal debris is 90: 10.  The largest soft tissue fragment has a resection margin which is inked blue.  This fragment is serially sectioned. Entirely submitted in cassettes 1-4 with the serially sectioned fragment in cassettes 1-3 and the remaining fragments in cassette 4.  B. Labeled: Hot snare hepatic flexure colon polyp x1 Received: Formalin Collection time: 11:05 AM on 10/14/2020 Placed into formalin time: 11:05 AM on 10/14/2020 Tissue fragment(s): 1 Size: 1.4 x 1 x 0.9 cm Description: Received is a polypoid portion of tan-pink soft tissue. There is a resection margin which is inked green.  The specimen is trisected. Entirely submitted in 1 cassette.  RB 10/14/2020   Final Diagnosis performed by Betsy Pries, MD.   Electronically signed 10/15/2020 3:35:17PM The electronic signature indicates that the named Attending Pathologist has evaluated the specimen Technical component performed at [facility], [address], [place], [place] [address] [place] b: [phone] Dir: Rush Farmer, MD, MMM  Professional component performed at [facility], [facility], [place], [place], [place] [address] Lab: [phone] Dir: Dellia Nims. Rubinas, MD       PHQ2/9: Depression screen [facility] 2/9 10/30/2020  09/09/2020 05/30/2020 04/24/2020 01/26/2020  Decreased Interest 0 1 0 0 0  Down, Depressed, Hopeless 1 1 0 0 0  PHQ - 2 Score 1 2 0 0 0  Altered sleeping 0 2 - - 0  Tired, decreased energy 0 1 - - 0  Change in appetite 0 2 - - 0  Feeling bad or failure about yourself  0 0 - - 0  Trouble concentrating 0 1 - - 0  Moving slowly or fidgety/restless 0 0 - - 0  Suicidal thoughts 0 0 - - 0  PHQ-9 Score 1 8 - - 0  Difficult doing work/chores - Not difficult at all - - -  Some recent data might be hidden    phq 9 is positive   Fall Risk: Fall Risk  10/30/2020 09/09/2020 05/30/2020 04/24/2020 01/26/2020  Falls in the past year? 1 1 1  0 0  Number falls in past yr: 0 0 1 0 0  Injury with Fall? 0 1 0 0 0  Risk for fall due to : - History of fall(s) History of fall(s) - -  Follow up - - Falls evaluation completed;Education provided - -     Functional Status Survey: Is the patient deaf or have difficulty hearing?: No Does the patient have difficulty seeing, even when wearing glasses/contacts?: No Does the patient have difficulty concentrating, remembering, or making decisions?: No Does the patient have difficulty walking or climbing stairs?: No Does the patient have difficulty dressing or bathing?: No Does the patient have difficulty doing errands alone such as visiting a doctor's office or shopping?: No    Assessment & Plan  1. Type 2 diabetes mellitus with microalbuminuria, without long-term current use of insulin (HCC)  - Microalbumin / creatinine urine ratio - COMPLETE METABOLIC PANEL WITH GFR  Starting Ozempic, discussed possible side effects   2. B12 deficiency  - Vitamin B12  3. Vitamin D deficiency  Continue vitamin D supplementation   4. Dyslipidemia   5. Vitamin B1 deficiency  - Vitamin B1  6. History of bariatric surgery  7. Benign hypertension  - COMPLETE METABOLIC PANEL WITH GFR - CBC with Differential/Platelet  8. OSA (obstructive sleep apnea)   9.  Morbid obesity ([place])  Discussed with the patient the risk posed by an increased BMI. Discussed importance of portion control, calorie counting and at least 150 minutes of physical activity weekly. Avoid sweet beverages and drink more water. Eat at least 6 servings of fruit and vegetables daily   10. Dyslipidemia associated with type 2 diabetes mellitus (HCC)  - Lipid panel  11. History of partial thyroidectomy  - TSH  12. Foot pain, right  - Ambulatory referral to Podiatry

## 2020-10-30 ENCOUNTER — Encounter: Payer: Self-pay | Admitting: Family Medicine

## 2020-10-30 ENCOUNTER — Ambulatory Visit (INDEPENDENT_AMBULATORY_CARE_PROVIDER_SITE_OTHER): Payer: BC Managed Care – PPO | Admitting: Family Medicine

## 2020-10-30 ENCOUNTER — Other Ambulatory Visit: Payer: Self-pay

## 2020-10-30 VITALS — BP 130/82 | HR 94 | Temp 98.4°F | Resp 16 | Ht 61.0 in | Wt 250.0 lb

## 2020-10-30 DIAGNOSIS — E1129 Type 2 diabetes mellitus with other diabetic kidney complication: Secondary | ICD-10-CM | POA: Diagnosis not present

## 2020-10-30 DIAGNOSIS — E1169 Type 2 diabetes mellitus with other specified complication: Secondary | ICD-10-CM | POA: Diagnosis not present

## 2020-10-30 DIAGNOSIS — R809 Proteinuria, unspecified: Secondary | ICD-10-CM

## 2020-10-30 DIAGNOSIS — E559 Vitamin D deficiency, unspecified: Secondary | ICD-10-CM

## 2020-10-30 DIAGNOSIS — I10 Essential (primary) hypertension: Secondary | ICD-10-CM | POA: Diagnosis not present

## 2020-10-30 DIAGNOSIS — E538 Deficiency of other specified B group vitamins: Secondary | ICD-10-CM | POA: Diagnosis not present

## 2020-10-30 DIAGNOSIS — G4733 Obstructive sleep apnea (adult) (pediatric): Secondary | ICD-10-CM

## 2020-10-30 DIAGNOSIS — Z9884 Bariatric surgery status: Secondary | ICD-10-CM

## 2020-10-30 DIAGNOSIS — E785 Hyperlipidemia, unspecified: Secondary | ICD-10-CM

## 2020-10-30 DIAGNOSIS — M79671 Pain in right foot: Secondary | ICD-10-CM

## 2020-10-30 DIAGNOSIS — E519 Thiamine deficiency, unspecified: Secondary | ICD-10-CM

## 2020-10-30 DIAGNOSIS — E890 Postprocedural hypothyroidism: Secondary | ICD-10-CM

## 2020-10-30 MED ORDER — LISINOPRIL 10 MG PO TABS: 10.0000 mg | ORAL_TABLET | Freq: Every day | ORAL | 1 refills | Status: DC

## 2020-10-30 MED ORDER — ROSUVASTATIN CALCIUM 5 MG PO TABS: 5.0000 mg | ORAL_TABLET | Freq: Every day | ORAL | 1 refills | Status: DC

## 2020-10-30 MED ORDER — OZEMPIC (0.25 OR 0.5 MG/DOSE) 2 MG/1.5ML ~~LOC~~ SOPN: 0.5000 mg | PEN_INJECTOR | SUBCUTANEOUS | 1 refills | Status: DC

## 2020-11-04 LAB — COMPLETE METABOLIC PANEL WITH GFR
AG Ratio: 1.9 (calc) (ref 1.0–2.5)
ALT: 29 U/L (ref 6–29)
AST: 19 U/L (ref 10–35)
Albumin: 4.7 g/dL (ref 3.6–5.1)
Alkaline phosphatase (APISO): 66 U/L (ref 37–153)
BUN: 11 mg/dL (ref 7–25)
CO2: 30 mmol/L (ref 20–32)
Calcium: 10 mg/dL (ref 8.6–10.4)
Chloride: 103 mmol/L (ref 98–110)
Creat: 0.64 mg/dL (ref 0.50–1.05)
GFR, Est African American: 118 mL/min/{1.73_m2} (ref >=60)
GFR, Est Non African American: 102 mL/min/{1.73_m2} (ref >=60)
Globulin: 2.5 g/dL (calc) (ref 1.9–3.7)
Glucose, Bld: 119 mg/dL — ABNORMAL HIGH (ref 65–99)
Potassium: 4.2 mmol/L (ref 3.5–5.3)
Sodium: 141 mmol/L (ref 135–146)
Total Bilirubin: 0.2 mg/dL (ref 0.2–1.2)
Total Protein: 7.2 g/dL (ref 6.1–8.1)

## 2020-11-04 LAB — CBC WITH DIFFERENTIAL/PLATELET
Absolute Monocytes: 328 cells/uL (ref 200–950)
Basophils Absolute: 29 cells/uL (ref 0–200)
Basophils Relative: 0.7 %
Eosinophils Absolute: 88 cells/uL (ref 15–500)
Eosinophils Relative: 2.1 %
HCT: 39.1 % (ref 35.0–45.0)
Hemoglobin: 13 g/dL (ref 11.7–15.5)
Lymphs Abs: 2356 cells/uL (ref 850–3900)
MCH: 29.1 pg (ref 27.0–33.0)
MCHC: 33.2 g/dL (ref 32.0–36.0)
MCV: 87.5 fL (ref 80.0–100.0)
MPV: 10.5 fL (ref 7.5–12.5)
Monocytes Relative: 7.8 %
Neutro Abs: 1399 cells/uL — ABNORMAL LOW (ref 1500–7800)
Neutrophils Relative %: 33.3 %
Platelets: 321 10*3/uL (ref 140–400)
RBC: 4.47 10*6/uL (ref 3.80–5.10)
RDW: 12.9 % (ref 11.0–15.0)
Total Lymphocyte: 56.1 %
WBC: 4.2 10*3/uL (ref 3.8–10.8)

## 2020-11-04 LAB — VITAMIN B1: Vitamin B1 (Thiamine): 29 nmol/L (ref 8–30)

## 2020-11-04 LAB — MICROALBUMIN / CREATININE URINE RATIO
Creatinine, Urine: 177 mg/dL (ref 20–275)
Microalb Creat Ratio: 10 mcg/mg creat (ref <30)
Microalb, Ur: 1.8 mg/dL

## 2020-11-04 LAB — TSH: TSH: 2.81 mIU/L

## 2020-11-04 LAB — LIPID PANEL
Cholesterol: 208 mg/dL — ABNORMAL HIGH (ref <200)
HDL: 47 mg/dL — ABNORMAL LOW (ref >=50)
LDL Cholesterol (Calc): 118 mg/dL (calc) — ABNORMAL HIGH
Non-HDL Cholesterol (Calc): 161 mg/dL (calc) — ABNORMAL HIGH (ref <130)
Total CHOL/HDL Ratio: 4.4 (calc) (ref <5.0)
Triglycerides: 302 mg/dL — ABNORMAL HIGH (ref <150)

## 2020-11-04 LAB — VITAMIN B12: Vitamin B-12: 445 pg/mL (ref 200–1100)

## 2020-11-27 ENCOUNTER — Other Ambulatory Visit: Payer: Self-pay

## 2020-11-27 ENCOUNTER — Encounter: Payer: Self-pay | Admitting: Podiatry

## 2020-11-27 ENCOUNTER — Ambulatory Visit (INDEPENDENT_AMBULATORY_CARE_PROVIDER_SITE_OTHER): Payer: BC Managed Care – PPO | Admitting: Podiatry

## 2020-11-27 ENCOUNTER — Ambulatory Visit (INDEPENDENT_AMBULATORY_CARE_PROVIDER_SITE_OTHER): Payer: BC Managed Care – PPO

## 2020-11-27 DIAGNOSIS — M722 Plantar fascial fibromatosis: Secondary | ICD-10-CM | POA: Diagnosis not present

## 2020-11-27 MED ORDER — TRIAMCINOLONE ACETONIDE 40 MG/ML IJ SUSP
20.0000 mg | Freq: Once | INTRAMUSCULAR | Status: AC
  Administered 2020-11-27: 20 mg

## 2020-11-27 NOTE — Progress Notes (Signed)
Subjective:  Patient ID: Desma Maxim, female    DOB: August 26, 1965,  MRN: [id number] HPI Chief Complaint  Patient presents with  . Foot Pain    Plantar arch, lateral foot, achilles right - aching x 2-3 months, initially was in arch, noticing later other areas painful, tried Tylenol and rest  . New Patient (Initial Visit)    55 y.o. female presents with the above complaint.   ROS: Denies fever chills nausea vomiting muscle aches pains calf pain back pain chest pain shortness of breath.  Past Medical History:  Diagnosis Date  . Acanthosis nigricans 03/09/2015  . Allergic rhinitis 03/09/2015  . Allergy   . Breast lump in female 10/142016  . Calculus of kidney   . GERD (gastroesophageal reflux disease)   . Hiatal hernia 03/09/2015  . History of anemia 03/09/2015  . History of kidney stones   . History of PCOS   . History of thyroid nodule   . Hyperlipidemia   . Hypertension   . Kidney stones   . Microalbuminuria    DM  . Microalbuminuria 05/29/2014  . Morbid obesity ([place]) 06/04/2015  . Pre-diabetes   . Sleep apnea   . Snoring   . Uterus, adenomyosis   . Vitamin D deficiency   . Vitamin D deficiency 03/09/2015   Past Surgical History:  Procedure Laterality Date  . ABDOMINAL HYSTERECTOMY  03/2012  . BREAST BIOPSY Right 12/2005   Normal-Cyst  . BREAST BIOPSY Right 12/2005  . BREAST REDUCTION SURGERY    . BREAST SURGERY Bilateral 1990   Reduction  . BREAST SURGERY Bilateral 1990  . COLONOSCOPY WITH PROPOFOL N/A 05/07/2017   Procedure: COLONOSCOPY WITH PROPOFOL;  Surgeon: Jonathon Bellows, MD;  Location: [facility] ENDOSCOPY;  Service: Gastroenterology;  Laterality: N/A;  . COLONOSCOPY WITH PROPOFOL N/A 10/14/2020   Procedure: COLONOSCOPY WITH PROPOFOL;  Surgeon: Lesly Rubenstein, MD;  Location: [facility] ENDOSCOPY;  Service: Endoscopy;  Laterality: N/A;  . OOPHORECTOMY Left 03/2011  . REDUCTION MAMMAPLASTY Bilateral   . REDUCTION MAMMAPLASTY Bilateral   . SLEEVE GASTROPLASTY   09/2016   [place]  . thyroidectomy Left 10/08/2015    Current Outpatient Medications:  .  Biotin 5000 MCG TABS, Take 5,000 mg by mouth 3 (three) times a week., Disp: , Rfl:  .  Cholecalciferol (VITAMIN D) 2000 units CAPS, Take 1 capsule (2,000 Units total) by mouth daily., Disp: 30 capsule, Rfl:  .  ELDERBERRY PO, Take by mouth., Disp: , Rfl:  .  Flaxseed, Linseed, (FLAXSEED OIL PO), Take by mouth., Disp: , Rfl:  .  fluticasone (FLONASE) 50 MCG/ACT nasal spray, Place 2 sprays into both nostrils daily., Disp: 16 g, Rfl: 6 .  hydrocortisone cream 1 %, Apply 1 application topically as needed for itching., Disp: , Rfl:  .  lisinopril (ZESTRIL) 10 MG tablet, Take 1 tablet (10 mg total) by mouth daily., Disp: 90 tablet, Rfl: 1 .  loratadine (CLARITIN) 10 MG tablet, Take by mouth., Disp: , Rfl:  .  Multiple Vitamin (MULTIVITAMIN) tablet, Take 1 tablet by mouth daily., Disp: , Rfl:  .  rosuvastatin (CRESTOR) 5 MG tablet, Take 1 tablet (5 mg total) by mouth daily., Disp: 90 tablet, Rfl: 1 .  Semaglutide,0.25 or 0.5MG /DOS, (OZEMPIC, 0.25 OR 0.5 MG/DOSE,) 2 MG/1.5ML SOPN, Inject 0.5 mg into the skin once a week., Disp: 4.5 mL, Rfl: 1 .  Thiamine HCl (VITAMIN B-1 PO), Take 500 mg by mouth daily., Disp: , Rfl:  .  Turmeric 500 MG CAPS, Take by mouth.,  Disp: , Rfl:  .  vitamin B-12 (CYANOCOBALAMIN) 1000 MCG tablet, Take 1,000 mcg by mouth 2 (two) times a week., Disp: , Rfl:  .  vitamin C (ASCORBIC ACID) 500 MG tablet, Take 500 mg by mouth daily., Disp: , Rfl:   Allergies  Allergen Reactions  . Zinc     Nausea, vomiting and diarrhea   . Latex Rash   Review of Systems Objective:  There were no vitals filed for this visit.  General: Well developed, nourished, in no acute distress, alert and oriented x3   Dermatological: Skin is warm, dry and supple bilateral. Nails x 10 are well maintained; remaining integument appears unremarkable at this time. There are no open sores, no preulcerative lesions, no  rash or signs of infection present.  Vascular: Dorsalis Pedis artery and Posterior Tibial artery pedal pulses are 2/4 bilateral with immedate capillary fill time. Pedal hair growth present. No varicosities and no lower extremity edema present bilateral.   Neruologic: Grossly intact via light touch bilateral. Vibratory intact via tuning fork bilateral. Protective threshold with Semmes Wienstein monofilament intact to all pedal sites bilateral. Patellar and Achilles deep tendon reflexes 2+ bilateral. No Babinski or clonus noted bilateral.   Musculoskeletal: No gross boney pedal deformities bilateral. No pain, crepitus, or limitation noted with foot and ankle range of motion bilateral. Muscular strength 5/5 in all groups tested bilateral.  Palpable dermatofibroma right arch.  Mild pain on palpation medial calcaneal tubercle.  Gait: Unassisted, Nonantalgic.    Radiographs:  Radiographs demonstrate osseously mature individual no significant osseous abnormalities noted.  Assessment & Plan:   Assessment: Plantar fibroma and plantar fasciitis  Plan: Discussed etiology pathology conservative surgical therapies.  At this point I injected the plantar fibroma I will follow-up with her as needed.     Roann Merk T. [place], [place]

## 2021-01-23 ENCOUNTER — Encounter: Payer: Self-pay | Admitting: Family Medicine

## 2021-01-23 ENCOUNTER — Other Ambulatory Visit: Payer: Self-pay

## 2021-01-23 ENCOUNTER — Emergency Department
Admission: EM | Admit: 2021-01-23 | Discharge: 2021-01-23 | Disposition: A | Discharge status: Home / Self Care | Payer: BC Managed Care – PPO | Attending: Emergency Medicine | Admitting: Emergency Medicine

## 2021-01-23 ENCOUNTER — Encounter: Payer: Self-pay | Admitting: *Deleted

## 2021-01-23 DIAGNOSIS — Z79899 Other long term (current) drug therapy: Secondary | ICD-10-CM | POA: Diagnosis not present

## 2021-01-23 DIAGNOSIS — J029 Acute pharyngitis, unspecified: Secondary | ICD-10-CM

## 2021-01-23 DIAGNOSIS — Z794 Long term (current) use of insulin: Secondary | ICD-10-CM | POA: Diagnosis not present

## 2021-01-23 DIAGNOSIS — I10 Essential (primary) hypertension: Secondary | ICD-10-CM | POA: Insufficient documentation

## 2021-01-23 DIAGNOSIS — E1129 Type 2 diabetes mellitus with other diabetic kidney complication: Secondary | ICD-10-CM | POA: Insufficient documentation

## 2021-01-23 DIAGNOSIS — Z91040 Latex allergy status: Secondary | ICD-10-CM | POA: Diagnosis not present

## 2021-01-23 DIAGNOSIS — U071 COVID-19: Secondary | ICD-10-CM | POA: Insufficient documentation

## 2021-01-23 LAB — SARS CORONAVIRUS 2 (TAT 6-24 HRS): SARS Coronavirus 2: POSITIVE — AB

## 2021-01-23 LAB — GROUP A STREP BY PCR: Group A Strep by PCR: NOT DETECTED

## 2021-01-23 MED ORDER — DEXAMETHASONE 10 MG/ML FOR PEDIATRIC ORAL USE
10.0000 mg | Freq: Once | INTRAMUSCULAR | Status: AC
  Administered 2021-01-23: 10 mg via ORAL
  Filled 2021-01-23: qty 1

## 2021-01-23 NOTE — ED Triage Notes (Signed)
Pt reports that she started with sore throat last night. Says she woke up this morning feeling like she has some swelling in her throat. Used ibuprofen and salt water gargles. COVID negative at home just PTA

## 2021-01-23 NOTE — ED Provider Notes (Signed)
[facility] Emergency Department Provider Note  ____________________________________________   Event Date/Time   First MD Initiated Contact with Patient 01/23/21 [phone]     (approximate)  I have reviewed the triage vital signs and the nursing notes.   HISTORY  Chief Complaint Sore Throat    HPI Debra Contreras is a 55 y.o. female who presents for evaluation of sore throat.  She reports that she developed a sore throat last night and it was significantly worse by this morning.  She had an episode where she woke up feeling like she could not breathe because she felt like her throat was closing although when she woke up that has been better.  She feels like her voice is hoarse.  She does not appreciate any swelling in her throat or her mouth.  She has tried taking ibuprofen and using salt water.  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  The patient has had no shortness of breath other than the episode while she was asleep.  She has not had any chest pain.  She denies nausea and vomiting.  She describes the symptoms as severe nothing in particular makes them better or worse.     Past Medical History:  Diagnosis Date   Acanthosis nigricans 03/09/2015   Allergic rhinitis 03/09/2015   Allergy    Breast lump in female 10/142016   Calculus of kidney    GERD (gastroesophageal reflux disease)    Hiatal hernia 03/09/2015   History of anemia 03/09/2015   History of kidney stones    History of PCOS    History of thyroid nodule    Hyperlipidemia    Hypertension    Kidney stones    Microalbuminuria    DM   Microalbuminuria 05/29/2014   Morbid obesity ([place]) 06/04/2015   Pre-diabetes    Sleep apnea    Snoring    Uterus, adenomyosis    Vitamin D deficiency    Vitamin D deficiency 03/09/2015    Patient Active Problem List    Diagnosis Date Noted   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   History of partial thyroidectomy 10/05/2015   H/O thyroid nodule 10/03/2015   Morbid obesity ([place]) 06/04/2015   Breast lump in female 05/17/2015   Acanthosis nigricans 03/09/2015   Allergic rhinitis, seasonal 03/09/2015   Diabetes mellitus with renal manifestation ([place]) 03/09/2015   Dyslipidemia 03/09/2015   Gastro-esophageal reflux disease without esophagitis 03/09/2015   Hiatal hernia 03/09/2015   History of anemia 03/09/2015   H/O: hysterectomy 03/09/2015   Uterus, adenomyosis 03/09/2015   Calculus of kidney 03/09/2015   Benign hypertension 03/09/2015   Extreme obesity 03/09/2015   Vitamin D deficiency 03/09/2015   Apnea, sleep 09/12/2014   Microalbuminuria 05/29/2014    Past Surgical History:  Procedure Laterality Date   ABDOMINAL HYSTERECTOMY  03/2012   BREAST BIOPSY Right 12/2005   Normal-Cyst   BREAST BIOPSY Right 12/2005   BREAST REDUCTION SURGERY     BREAST SURGERY Bilateral 1990   Reduction   BREAST SURGERY Bilateral 1990   COLONOSCOPY WITH PROPOFOL N/A 05/07/2017   Procedure: COLONOSCOPY WITH PROPOFOL;  Surgeon: Jonathon Bellows, MD;  Location: [facility] ENDOSCOPY;  Service: Gastroenterology;  Laterality: N/A;   COLONOSCOPY WITH PROPOFOL N/A 10/14/2020   Procedure: COLONOSCOPY WITH PROPOFOL;  Surgeon: Lesly Rubenstein, MD;  Location: [facility] ENDOSCOPY;  Service: Endoscopy;  Laterality: N/A;   OOPHORECTOMY Left 03/2011   REDUCTION MAMMAPLASTY Bilateral    REDUCTION MAMMAPLASTY Bilateral    SLEEVE GASTROPLASTY  09/2016   [place]   thyroidectomy Left 10/08/2015    Prior to Admission medications   Medication Sig Start Date End Date Taking? Authorizing Provider  Biotin 5000 MCG TABS Take 5,000 mg by mouth 3 (three) times a week.    [provider]  Cholecalciferol (VITAMIN D) 2000 units CAPS Take 1 capsule (2,000 Units total) by mouth daily. 12/08/16   Hubbard Hartshorn, FNP  ELDERBERRY PO Take by mouth.     [provider]  Flaxseed, Linseed, (FLAXSEED OIL PO) Take by mouth.    [provider]  fluticasone (FLONASE) 50 MCG/ACT nasal spray Place 2 sprays into both nostrils daily. 10/06/18   Steele Sizer, MD  hydrocortisone cream 1 % Apply 1 application topically as needed for itching.    [provider]  lisinopril (ZESTRIL) 10 MG tablet Take 1 tablet (10 mg total) by mouth daily. 10/30/20   Steele Sizer, MD  loratadine (CLARITIN) 10 MG tablet Take by mouth.    [provider]  Multiple Vitamin (MULTIVITAMIN) tablet Take 1 tablet by mouth daily.    [provider]  rosuvastatin (CRESTOR) 5 MG tablet Take 1 tablet (5 mg total) by mouth daily. 10/30/20   Steele Sizer, MD  Semaglutide,0.25 or 0.5MG /DOS, (OZEMPIC, 0.25 OR 0.5 MG/DOSE,) 2 MG/1.5ML SOPN Inject 0.5 mg into the skin once a week. 10/30/20   Steele Sizer, MD  Thiamine HCl (VITAMIN B-1 PO) Take 500 mg by mouth daily.    [provider]  Turmeric 500 MG CAPS Take by mouth.    [provider]  vitamin B-12 (CYANOCOBALAMIN) 1000 MCG tablet Take 1,000 mcg by mouth 2 (two) times a week.    [provider]  vitamin C (ASCORBIC ACID) 500 MG tablet Take 500 mg by mouth daily.    [provider]    Allergies Zinc and Latex  Family History  Problem Relation Age of Onset   Hypertension Mother    Cirrhosis Mother    COPD Mother        Heavy Smoker   Hepatitis C Mother    Alcohol abuse Father    Heart disease Father    Heart attack Father    Diabetes Brother    Obesity Brother    Lung cancer Maternal Grandmother        Snuff   Diabetes Maternal Grandmother    Alcoholism Maternal Grandmother    Prostate cancer Paternal Grandfather    Alcoholism Maternal Grandfather    Diabetes Maternal Grandfather    Breast cancer Cousin     Social History Social History   Tobacco Use   Smoking status: Never   Smokeless tobacco: Never  Vaping Use   Vaping Use:  Never used  Substance Use Topics   Alcohol use: Yes    Alcohol/week: 0.0 standard drinks    Comment: wine-social drinker   Drug use: No    Review of Systems Constitutional: No fever/chills Eyes: No visual changes. ENT: +sore throat. Cardiovascular: Denies chest pain. Respiratory: Episode of shortness of breath and sore throat while sleeping. Gastrointestinal: No abdominal pain.  No nausea, no vomiting.  No diarrhea.  No constipation. Genitourinary: Negative for dysuria. Musculoskeletal: Negative for neck pain.  Negative for back pain. Integumentary: Negative for rash. Neurological: Negative for headaches, focal weakness or numbness.   ____________________________________________  PHYSICAL EXAM:  VITAL SIGNS: ED Triage Vitals [01/23/21 0133]  Enc Vitals Group     BP 122/86     Pulse Rate 84     Resp 18     Temp 98 F (36.7 C)     Temp src      SpO2 97 %     Weight      Height      Head Circumference      Peak Flow      Pain Score 4     Pain Loc      Pain Edu?      Excl. in [place]?     Constitutional: Alert and oriented.  Eyes: Conjunctivae are normal.  Head: Atraumatic. Nose: No congestion/rhinnorhea. Mouth/Throat: Oropharynx is normal in appearance with no swelling, no erythema, no petechiae, no tonsillar hypertrophy.  Airway is patent.  No suggestion of peritonsillar abscess nor Ludwig's angina. Neck: No stridor.  No meningeal signs.   Cardiovascular: Normal rate, regular rhythm. Good peripheral circulation. Respiratory: Normal respiratory effort.  No retractions. Gastrointestinal: Soft and nontender. No distention.  Musculoskeletal: No lower extremity tenderness nor edema. No gross deformities of extremities. Neurologic:  Normal speech and language. No gross focal neurologic deficits are appreciated.  Skin:  Skin is warm, dry and intact.   ____________________________________________   LABS (all labs ordered are listed, but only abnormal results are  displayed)  Labs Reviewed  GROUP A STREP BY PCR  SARS CORONAVIRUS 2 (TAT 6-24 HRS)   ____________________________________________    INITIAL IMPRESSION / MDM / ASSESSMENT AND PLAN / ED COURSE  As part of my medical decision making, I reviewed the following data within the [place] notes reviewed and incorporated, Labs reviewed , Old chart reviewed, and Notes from prior ED visits   Viral infection most likely, possibly COVID-19, although patient has no other symptoms.  However she is also fully vaccinated and boosted so symptoms might be milder.  There is no indication that she has a bacterial pharyngitis requiring antibiotics.  Given her iodine aphasia, I am providing Decadron 10 mg by mouth.  She is getting up COVID PCR test and she knows to check her MyChart for the results later today.  She is comfortable with the plan.  I gave my usual and customary return precautions.  No evidence of any emergent medical condition at this time.      ____________________________________________  FINAL CLINICAL IMPRESSION(S) / ED DIAGNOSES  Final diagnoses:  Pharyngitis, unspecified etiology     MEDICATIONS GIVEN DURING THIS VISIT:  Medications  dexamethasone (DECADRON) 10 MG/ML injection for Pediatric ORAL use 10 mg (10 mg Oral Given 01/23/21 0433)     ED Discharge Orders     None        Note:  This document was prepared using Dragon voice recognition software and may include unintentional dictation errors.   Hinda Kehr, MD 01/23/21 [phone]

## 2021-01-23 NOTE — ED Notes (Signed)
Discharge instructions reviewed with pt. Pt verbalized understanding. Pt ambulated to waiting area without difficulty.    Ruben Reason, RN

## 2021-01-23 NOTE — Discharge Instructions (Addendum)
Please check MyChart later today to see the results of your COVID PCR test.  Read through the included information for additional recommendations for dealing with a sore throat.  The dose of Decadron 10 mg by mouth you received today should also help.  Follow up with your regular doctor.

## 2021-01-29 ENCOUNTER — Ambulatory Visit: Payer: Self-pay

## 2021-01-29 NOTE — Telephone Encounter (Signed)
Patient called and says she was tested for COVID when she went to the ED for SOB on last Thursday. She says she's been out of work and her symptoms are not improving and they told her she could come back to work. She says she still has moderate SOB all the time no matter what she's doing. She says this morning she had the highest low grade fever of 100.2, low grade for a week. She has a cough that is a 6/10 on the scale, headache and fatigue are other symptoms. Advised she will need a follow up visit. MyChart video scheduled for tomorrow 01/30/21 at 0820 with Dr. Ancil Boozer, care advice given, patient verbalized understanding.   Reason for Disposition  [1] PERSISTING SYMPTOMS OF COVID-19 AND [2] symptoms WORSE  Answer Assessment - Initial Assessment Questions 1. COVID-19 ONSET: "When did the symptoms of COVID-19 first start?"     Last Wednesday 01/22/21 2. DIAGNOSIS CONFIRMATION: "How were you diagnosed?" (e.g., COVID-19 oral or nasal viral test; COVID-19 antibody test; doctor visit)     PCR test in the ED on Thursday 01/23/21 3. MAIN SYMPTOM:  "What is your main concern or symptom right now?" (e.g., breathing difficulty, cough, fatigue. loss of smell)     Still having SOB, headache, fever, gastric issues w/soft stool, temp 100.2, cough 4. SYMPTOM ONSET: "When did the symptoms start?"     N/A 5. BETTER-SAME-WORSE: "Are you getting better, staying the same, or getting worse over the last 1 to 2 weeks?"     Worse 6. RECENT MEDICAL VISIT: "Have you been seen by a healthcare provider (doctor, NP, PA) for these persisting COVID-19 symptoms?" If Yes, ask: "When were you seen?" (e.g., date)     ED provider on 01/23/21 7. COUGH: "Do you have a cough?" If Yes, ask: "How bad is the cough?"       Yes 6 on scale 1-10 8. FEVER: "Do you have a fever?" If Yes, ask: "What is your temperature, how was it measured, and when did it start?"     100.2 9. BREATHING DIFFICULTY: "Are you having any trouble breathing?" If  Yes, ask: "How bad is your breathing?" (e.g., mild, moderate, severe)    - MILD: No SOB at rest, mild SOB with walking, speaks normally in sentences, can lie down, no retractions, pulse < 100.    - MODERATE: SOB at rest, SOB with minimal exertion and prefers to sit, cannot lie down flat, speaks in phrases, mild retractions, audible wheezing, pulse 100-120.    - SEVERE: Very SOB at rest, speaks in single words, struggling to breathe, sitting hunched forward, retractions, pulse > 120       Moderate 10. HIGH RISK DISEASE: "Do you have any chronic medical problems?" (e.g., asthma, heart or lung disease, weak immune system, obesity, etc.)       Yes 11. VACCINE: "Have you gotten the COVID-19 vaccine?" If Yes, ask: "Which one, how many shots, when did you get it?"       Yes, Moderna 2 shots Jan/Feb 2021 12. BOOSTER: "Have you received your COVID-19 booster?" If Yes, ask: "Which one and when did you get it?"       Yes, Moderna x 1 June 18, 2020 13. PREGNANCY: "Is there any chance you are pregnant?" "When was your last menstrual period?"       No 14. OTHER SYMPTOMS: "Do you have any other symptoms?"  (e.g., fatigue, headache, muscle pain, weakness)       Headache, fatigue  15. O2 SATURATION MONITOR:  "Do you use an oxygen saturation monitor (pulse oximeter) at home?" If Yes, ask "What is your reading (oxygen level) today?" "What is your usual oxygen saturation reading?" (e.g., 95%)       No  Protocols used: Coronavirus (COVID-19) Persisting Symptoms Follow-up Call-A-[facility]

## 2021-01-29 NOTE — Progress Notes (Signed)
Name: Debra Contreras   MRN: [id number]    DOB: 01/12/1966   Date:01/29/2021       Progress Note  Subjective  Chief Complaint  COVID Positive  I connected with  Desma Maxim  on 01/29/21 at  8:20 AM EDT by a video enabled telemedicine application and verified that I am speaking with the correct person using two identifiers.  I discussed the limitations of evaluation and management by telemedicine and the availability of in person appointments. The patient expressed understanding and agreed to proceed with the virtual visit  Staff also discussed with the patient that there may be a patient responsible charge related to this service. 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 She states worse symptom was sore throat and SOB. She has been out of work since last week. She is still not feeling back to normal . She continues to have a fever, yesterday was 102. She continues to feel tired, has headaches and SOB that is worse at night. She has wheezing but that is improving. She feels congested in her chest and has a cough that is mostly dry. She has a change in taste ( can only taste something that is very sweet or very salt) unable to smell. She is not sure if she is losing weight. Drinking plenty of water but urine is still very dark. She is struggling with mental fogginess.    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   History of partial thyroidectomy 10/05/2015   H/O thyroid nodule 10/03/2015   Morbid obesity ([place]) 06/04/2015   Breast lump in female 05/17/2015   Acanthosis nigricans 03/09/2015   Allergic rhinitis, seasonal 03/09/2015   Diabetes mellitus with renal manifestation ([place])  03/09/2015   Dyslipidemia 03/09/2015   Gastro-esophageal reflux disease without esophagitis 03/09/2015   Hiatal hernia 03/09/2015   History of anemia 03/09/2015   H/O: hysterectomy 03/09/2015   Uterus, adenomyosis 03/09/2015   Calculus of kidney 03/09/2015   Benign hypertension 03/09/2015   Extreme obesity 03/09/2015   Vitamin D deficiency 03/09/2015   Apnea, sleep 09/12/2014   Microalbuminuria 05/29/2014    Past Surgical History:  Procedure Laterality Date   ABDOMINAL HYSTERECTOMY  03/2012   BREAST BIOPSY Right 12/2005   Normal-Cyst   BREAST BIOPSY Right 12/2005   BREAST REDUCTION SURGERY     BREAST SURGERY Bilateral 1990   Reduction   BREAST SURGERY Bilateral 1990   COLONOSCOPY WITH PROPOFOL N/A 05/07/2017   Procedure: COLONOSCOPY WITH PROPOFOL;  Surgeon: Jonathon Bellows, MD;  Location: [facility] ENDOSCOPY;  Service: Gastroenterology;  Laterality: N/A;   COLONOSCOPY WITH PROPOFOL N/A 10/14/2020   Procedure: COLONOSCOPY WITH PROPOFOL;  Surgeon: Lesly Rubenstein, MD;  Location: [facility] ENDOSCOPY;  Service: Endoscopy;  Laterality: N/A;   OOPHORECTOMY Left 03/2011   REDUCTION MAMMAPLASTY Bilateral    REDUCTION MAMMAPLASTY Bilateral    SLEEVE GASTROPLASTY  09/2016   [place]   thyroidectomy Left 10/08/2015    Family History  Problem Relation Age of Onset   Hypertension Mother    Cirrhosis Mother    COPD Mother        Heavy Smoker   Hepatitis C Mother    Alcohol abuse Father  Heart disease Father    Heart attack Father    Diabetes Brother    Obesity Brother    Lung cancer Maternal Grandmother        Snuff   Diabetes Maternal Grandmother    Alcoholism Maternal Grandmother    Prostate cancer Paternal Grandfather    Alcoholism Maternal Grandfather    Diabetes Maternal Grandfather    Breast cancer Cousin     Social History   Socioeconomic History   Marital status: Divorced    Spouse name: Not on file   Number of children: 0   Years of education: Not on file   Highest  education level: Bachelor's degree (e.g., BA, AB, BS)  Occupational History   Occupation: Designer, jewellery   Tobacco Use   Smoking status: Never   Smokeless tobacco: Never  Vaping Use   Vaping Use: Never used  Substance and Sexual Activity   Alcohol use: Yes    Alcohol/week: 0.0 standard drinks    Comment: wine-social drinker   Drug use: No   Sexual activity: Yes    Partners: Male  Other Topics Concern   Not on file  Social History Narrative   Her grown up niece moved in with her May 2020    She is a Designer, jewellery.    Social Determinants of Health   Financial Resource Strain: Low Risk    Difficulty of Paying Living Expenses: Not hard at all  Food Insecurity: No Food Insecurity   Worried About Charity fundraiser in the Last Year: Never true   [place] in the Last Year: Never true  Transportation Needs: No Transportation Needs   Lack of Transportation (Medical): No   Lack of Transportation (Non-Medical): No  Physical Activity: Inactive   Days of Exercise per Week: 0 days   Minutes of Exercise per Session: 0 min  Stress: Stress Concern Present   Feeling of Stress : Very much  Social Connections: Moderately Integrated   Frequency of Communication with Friends and Family: More than three times a week   Frequency of Social Gatherings with Friends and Family: Never   Attends Religious Services: More than 4 times per year   Active Member of [employer] or Organizations: Yes   Attends Music therapist: More than 4 times per year   Marital Status: Divorced  Human resources officer Violence: Not At Risk   Fear of Current or Ex-Partner: No   Emotionally Abused: No   Physically Abused: No   Sexually Abused: No     Current Outpatient Medications:    Biotin 5000 MCG TABS, Take 5,000 mg by mouth 3 (three) times a week., Disp: , Rfl:    Cholecalciferol (VITAMIN D) 2000 units CAPS, Take 1 capsule (2,000 Units total) by mouth daily., Disp: 30 capsule, Rfl:    ELDERBERRY  PO, Take by mouth., Disp: , Rfl:    Flaxseed, Linseed, (FLAXSEED OIL PO), Take by mouth., Disp: , Rfl:    fluticasone (FLONASE) 50 MCG/ACT nasal spray, Place 2 sprays into both nostrils daily., Disp: 16 g, Rfl: 6   hydrocortisone cream 1 %, Apply 1 application topically as needed for itching., Disp: , Rfl:    lisinopril (ZESTRIL) 10 MG tablet, Take 1 tablet (10 mg total) by mouth daily., Disp: 90 tablet, Rfl: 1   loratadine (CLARITIN) 10 MG tablet, Take by mouth., Disp: , Rfl:    Multiple Vitamin (MULTIVITAMIN) tablet, Take 1 tablet by mouth daily., Disp: , Rfl:  rosuvastatin (CRESTOR) 5 MG tablet, Take 1 tablet (5 mg total) by mouth daily., Disp: 90 tablet, Rfl: 1   Semaglutide,0.25 or 0.5MG /DOS, (OZEMPIC, 0.25 OR 0.5 MG/DOSE,) 2 MG/1.5ML SOPN, Inject 0.5 mg into the skin once a week., Disp: 4.5 mL, Rfl: 1   Thiamine HCl (VITAMIN B-1 PO), Take 500 mg by mouth daily., Disp: , Rfl:    Turmeric 500 MG CAPS, Take by mouth., Disp: , Rfl:    vitamin B-12 (CYANOCOBALAMIN) 1000 MCG tablet, Take 1,000 mcg by mouth 2 (two) times a week., Disp: , Rfl:    vitamin C (ASCORBIC ACID) 500 MG tablet, Take 500 mg by mouth daily., Disp: , Rfl:   Allergies  Allergen Reactions   Zinc     Nausea, vomiting and diarrhea    Latex Rash    I personally reviewed active problem list, medication list, allergies, family history, social history with the patient/caregiver today.   ROS  Ten systems reviewed and is negative except as mentioned in HPI  Objective  Virtual encounter, vitals not obtained.  There is no height or weight on file to calculate BMI.  Physical Exam  Awake, alert and oriented    PHQ2/9: Depression screen [facility] 2/9 10/30/2020 09/09/2020 05/30/2020 04/24/2020 01/26/2020  Decreased Interest 0 1 0 0 0  Down, Depressed, Hopeless 1 1 0 0 0  PHQ - 2 Score 1 2 0 0 0  Altered sleeping 0 2 - - 0  Tired, decreased energy 0 1 - - 0  Change in appetite 0 2 - - 0  Feeling bad or failure about  yourself  0 0 - - 0  Trouble concentrating 0 1 - - 0  Moving slowly or fidgety/restless 0 0 - - 0  Suicidal thoughts 0 0 - - 0  PHQ-9 Score 1 8 - - 0  Difficult doing work/chores - Not difficult at all - - -  Some recent data might be hidden   PHQ-2/9 Result is positive   Fall Risk: Fall Risk  10/30/2020 09/09/2020 05/30/2020 04/24/2020 01/26/2020  Falls in the past year? 1 1 1  0 0  Number falls in past yr: 0 0 1 0 0  Injury with Fall? 0 1 0 0 0  Risk for fall due to : - History of fall(s) History of fall(s) - -  Follow up - - Falls evaluation completed;Education provided - -     Assessment & Plan  1. COVID-19  - benzonatate (TESSALON) 100 MG capsule; Take 1-2 capsules (100-200 mg total) by mouth 3 (three) times daily as needed.  Dispense: 60 capsule; Refill: 0 - Fluticasone-Umeclidin-Vilant (TRELEGY ELLIPTA) 100-62.5-25 MCG/INH AEPB; Inhale 1 puff into the lungs daily.  Dispense: 60 each; Refill: 0 - DG Chest 2 View; Future - CBC with Differential/Platelet; Future - Basic Metabolic Panel (BMET); Future Discussed importance of getting pulse oximeter and also to self quarantine. She is not returning to work until Monday but explained if mental fogginess affects her ability to work to let me know and we can extend her time off   2. SOB (shortness of breath)  - benzonatate (TESSALON) 100 MG capsule; Take 1-2 capsules (100-200 mg total) by mouth 3 (three) times daily as needed.  Dispense: 60 capsule; Refill: 0 - Fluticasone-Umeclidin-Vilant (TRELEGY ELLIPTA) 100-62.5-25 MCG/INH AEPB; Inhale 1 puff into the lungs daily.  Dispense: 60 each; Refill: 0 - DG Chest 2 View; Future - CBC with Differential/Platelet; Future - Basic Metabolic Panel (BMET); Future   I discussed the  assessment and treatment plan with the patient. The patient was provided an opportunity to ask questions and all were answered. The patient agreed with the plan and demonstrated an understanding of the  instructions.  The patient was advised to call back or seek an in-person evaluation if the symptoms worsen or if the condition fails to improve as anticipated.  I provided 25  minutes of non-face-to-face time during this encounter.

## 2021-01-30 ENCOUNTER — Ambulatory Visit
Admission: RE | Admit: 2021-01-30 | Discharge: 2021-01-30 | Disposition: A | Discharge status: Home / Self Care | Payer: BC Managed Care – PPO | Attending: Family Medicine | Admitting: Family Medicine

## 2021-01-30 ENCOUNTER — Encounter: Payer: Self-pay | Admitting: Family Medicine

## 2021-01-30 ENCOUNTER — Ambulatory Visit
Admission: RE | Admit: 2021-01-30 | Discharge: 2021-01-30 | Disposition: A | Discharge status: Home / Self Care | Payer: BC Managed Care – PPO | Source: Ambulatory Visit | Attending: Family Medicine | Admitting: Family Medicine

## 2021-01-30 ENCOUNTER — Other Ambulatory Visit
Admission: RE | Admit: 2021-01-30 | Discharge: 2021-01-30 | Disposition: A | Discharge status: Home / Self Care | Payer: BC Managed Care – PPO | Source: Home / Self Care | Attending: Family Medicine | Admitting: Family Medicine

## 2021-01-30 ENCOUNTER — Telehealth (INDEPENDENT_AMBULATORY_CARE_PROVIDER_SITE_OTHER): Payer: BC Managed Care – PPO | Admitting: Family Medicine

## 2021-01-30 DIAGNOSIS — U071 COVID-19: Secondary | ICD-10-CM | POA: Diagnosis not present

## 2021-01-30 DIAGNOSIS — R0602 Shortness of breath: Secondary | ICD-10-CM

## 2021-01-30 DIAGNOSIS — R059 Cough, unspecified: Secondary | ICD-10-CM | POA: Diagnosis not present

## 2021-01-30 DIAGNOSIS — I10 Essential (primary) hypertension: Secondary | ICD-10-CM | POA: Diagnosis not present

## 2021-01-30 LAB — CBC WITH DIFFERENTIAL/PLATELET
Abs Immature Granulocytes: 0.01 K/uL (ref 0.00–0.07)
Basophils Absolute: 0 K/uL (ref 0.0–0.1)
Basophils Relative: 1 %
Eosinophils Absolute: 0.1 K/uL (ref 0.0–0.5)
Eosinophils Relative: 2 %
HCT: 41.8 % (ref 36.0–46.0)
Hemoglobin: 13.9 g/dL (ref 12.0–15.0)
Immature Granulocytes: 0 %
Lymphocytes Relative: 58 %
Lymphs Abs: 2.3 K/uL (ref 0.7–4.0)
MCH: 29.1 pg (ref 26.0–34.0)
MCHC: 33.3 g/dL (ref 30.0–36.0)
MCV: 87.4 fL (ref 80.0–100.0)
Monocytes Absolute: 0.3 K/uL (ref 0.1–1.0)
Monocytes Relative: 8 %
Neutro Abs: 1.2 K/uL — ABNORMAL LOW (ref 1.7–7.7)
Neutrophils Relative %: 31 %
Platelets: 309 K/uL (ref 150–400)
RBC: 4.78 MIL/uL (ref 3.87–5.11)
RDW: 13.2 % (ref 11.5–15.5)
WBC: 4 K/uL (ref 4.0–10.5)
nRBC: 0 % (ref 0.0–0.2)

## 2021-01-30 LAB — BASIC METABOLIC PANEL
Anion gap: 11 (ref 5–15)
BUN: 10 mg/dL (ref 6–20)
CO2: 26 mmol/L (ref 22–32)
Calcium: 9.7 mg/dL (ref 8.9–10.3)
Chloride: 103 mmol/L (ref 98–111)
Creatinine, Ser: 0.47 mg/dL (ref 0.44–1.00)
GFR, Estimated: >60 mL/min (ref >60)
Glucose, Bld: 105 mg/dL — ABNORMAL HIGH (ref 70–99)
Potassium: 3.9 mmol/L (ref 3.5–5.1)
Sodium: 140 mmol/L (ref 135–145)

## 2021-01-30 MED ORDER — BENZONATATE 100 MG PO CAPS: 100.0000 mg | ORAL_CAPSULE | Freq: Three times a day (TID) | ORAL | 0 refills | Status: DC | PRN

## 2021-01-30 MED ORDER — TRELEGY ELLIPTA 100-62.5-25 MCG/INH IN AEPB: 1.0000 | INHALATION_SPRAY | Freq: Every day | RESPIRATORY_TRACT | 0 refills | Status: DC

## 2021-02-06 ENCOUNTER — Ambulatory Visit: Payer: BC Managed Care – PPO | Admitting: Family Medicine

## 2021-02-17 ENCOUNTER — Encounter: Payer: Self-pay | Admitting: Family Medicine

## 2021-02-18 ENCOUNTER — Ambulatory Visit: Payer: BC Managed Care – PPO | Admitting: Family Medicine

## 2021-03-12 ENCOUNTER — Encounter: Payer: Self-pay | Admitting: Family Medicine

## 2021-03-13 ENCOUNTER — Encounter: Payer: Self-pay | Admitting: Family Medicine

## 2021-03-19 NOTE — Progress Notes (Signed)
Name: Debra Contreras   MRN: [id number]    DOB: 04/29/1966   Date:03/21/2021       Progress Note  Subjective  Chief Complaint  Follow Up  HPI  Thyroid nodule: she was sitting on her desk working on a report and felt a large nodule on the left side back in October 2016, she had a partial thyroidectomy ( removal of left side) in March 2017, and is doing well since. Last TSH was at goal. She denies palpations , skin changes or change in bowel movements    DMII with renal manifestation, gingivitis, obesity, dyslipidemia: no longer taking Metformin but still takes Lisinopril 10 mg for proteinuria, urine micro was elevated in the past at  50 and we will recheck it today. She is not checking glucose at home.  She denies polyphagia, polydipsia or polyuria. Her A1C was 5.6 % up to  6.1 % down to 5.8 % and today is up to 6.1 % again .  She is on Crestor 5 mg but LDL not at goal we will adjust dose to 10 mg. . She gave her Ozempic but not covered by insurance , we will try another GLP-1 agonist . We will try Mounjaro , giving her samples.    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 Weight went down to 186 lbs ( lowest after bariatric surgery ). Today is stable at 251.7 lbs   HTN: she is compliant with medication, denies chest pain, palpitation or dizziness. BP is at goal.    Dyslipidemia: she is taking  5 mg Crestor and denies myalgia. Last LDL was 118, explained still not at goal. Discussed going up on dose    OSA:   Symptoms improved with weight loss, but she has gained it back,  she wakes up feeling tired, she was supposed to have a sleep study done, but is changing insurance soon and does not want to have it done   Frozen Shoulder:  she states she woke up about 6  months ago  with left shoulder pain, she has decrease rom of left shoulder, worse with abduction, pain is worse on left anterior shoulder. Pain is described as sharp, tender to touch. Sometimes radiates towards her neck but never down her left arm. She will follow up with them.   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   History of partial thyroidectomy 10/05/2015   H/O thyroid nodule 10/03/2015   Morbid obesity ([place]) 06/04/2015   Breast lump in female 05/17/2015   Acanthosis nigricans 03/09/2015   Allergic rhinitis, seasonal 03/09/2015   Diabetes mellitus with renal manifestation ([place]) 03/09/2015   Dyslipidemia 03/09/2015   Gastro-esophageal reflux disease without esophagitis 03/09/2015   Hiatal hernia 03/09/2015   History of anemia 03/09/2015   H/O: hysterectomy 03/09/2015   Uterus, adenomyosis 03/09/2015   Calculus of kidney 03/09/2015   Benign hypertension 03/09/2015   Extreme obesity 03/09/2015   Vitamin D deficiency 03/09/2015   Apnea, sleep 09/12/2014   Microalbuminuria 05/29/2014    Past Surgical History:  Procedure Laterality Date   ABDOMINAL HYSTERECTOMY  03/2012   BREAST BIOPSY  Right 12/2005   Normal-Cyst   BREAST BIOPSY Right 12/2005   BREAST REDUCTION SURGERY     BREAST SURGERY Bilateral 1990   Reduction   BREAST SURGERY Bilateral 1990   COLONOSCOPY WITH PROPOFOL N/A 05/07/2017   Procedure: COLONOSCOPY WITH PROPOFOL;  Surgeon: Jonathon Bellows, MD;  Location: [facility] ENDOSCOPY;  Service: Gastroenterology;  Laterality: N/A;   COLONOSCOPY WITH PROPOFOL N/A 10/14/2020   Procedure: COLONOSCOPY WITH PROPOFOL;  Surgeon: Lesly Rubenstein, MD;  Location: [facility] ENDOSCOPY;  Service: Endoscopy;  Laterality: N/A;   OOPHORECTOMY Left 03/2011   REDUCTION MAMMAPLASTY Bilateral    REDUCTION MAMMAPLASTY Bilateral    SLEEVE GASTROPLASTY  09/2016   [place]   thyroidectomy Left  10/08/2015    Family History  Problem Relation Age of Onset   Hypertension Mother    Cirrhosis Mother    COPD Mother        Heavy Smoker   Hepatitis C Mother    Alcohol abuse Father    Heart disease Father    Heart attack Father    Diabetes Brother    Obesity Brother    Lung cancer Maternal Grandmother        Snuff   Diabetes Maternal Grandmother    Alcoholism Maternal Grandmother    Prostate cancer Paternal Grandfather    Alcoholism Maternal Grandfather    Diabetes Maternal Grandfather    Breast cancer Cousin     Social History   Tobacco Use   Smoking status: Never   Smokeless tobacco: Never  Substance Use Topics   Alcohol use: Yes    Alcohol/week: 0.0 standard drinks    Comment: wine-social drinker     Current Outpatient Medications:    Biotin 5000 MCG TABS, Take 5,000 mg by mouth 3 (three) times a week., Disp: , Rfl:    Cholecalciferol (VITAMIN D) 2000 units CAPS, Take 1 capsule (2,000 Units total) by mouth daily., Disp: 30 capsule, Rfl:    ELDERBERRY PO, Take by mouth., Disp: , Rfl:    Flaxseed, Linseed, (FLAXSEED OIL PO), Take by mouth., Disp: , Rfl:    fluticasone (FLONASE) 50 MCG/ACT nasal spray, Place 2 sprays into both nostrils daily., Disp: 16 g, Rfl: 6   hydrocortisone cream 1 %, Apply 1 application topically as needed for itching., Disp: , Rfl:    loratadine (CLARITIN) 10 MG tablet, Take by mouth., Disp: , Rfl:    Multiple Vitamin (MULTIVITAMIN) tablet, Take 1 tablet by mouth daily., Disp: , Rfl:    Thiamine HCl (VITAMIN B-1 PO), Take 500 mg by mouth daily., Disp: , Rfl:    Turmeric 500 MG CAPS, Take by mouth., Disp: , Rfl:    vitamin B-12 (CYANOCOBALAMIN) 1000 MCG tablet, Take 1,000 mcg by mouth 2 (two) times a week., Disp: , Rfl:    vitamin C (ASCORBIC ACID) 500 MG tablet, Take 500 mg by mouth daily., Disp: , Rfl:    lisinopril (ZESTRIL) 10 MG tablet, Take 1 tablet (10 mg total) by mouth daily., Disp: 90 tablet, Rfl: 1   rosuvastatin (CRESTOR) 10 MG  tablet, Take 1 tablet (10 mg total) by mouth daily., Disp: 90 tablet, Rfl: 1  Allergies  Allergen Reactions   Zinc     Nausea, vomiting and diarrhea    Latex Rash    I personally reviewed active problem list, medication list, allergies, family history, social history, health maintenance with the patient/caregiver today.   ROS  Constitutional: Negative for fever or weight change.  Respiratory: Negative for cough and shortness  of breath.   Cardiovascular: Negative for chest pain or palpitations.  Gastrointestinal: Negative for abdominal pain, no bowel changes.  Musculoskeletal: Negative for gait problem or joint swelling.  Skin: Negative for rash.  Neurological: Negative for dizziness or headache.  No other specific complaints in a complete review of systems (except as listed in HPI above).   Objective  Vitals:   03/21/21 0948  BP: 132/78  Pulse: 83  Resp: 18  Temp: 98.6 F (37 C)  TempSrc: Oral  SpO2: 98%  Weight: 251 lb 11.2 oz (114.2 kg)  Height: '5\' 1"'$  (1.549 m)    Body mass index is 47.56 kg/m.  Physical Exam  Constitutional: Patient appears well-developed and well-nourished. Obese  No distress.  HEENT: head atraumatic, normocephalic, pupils equal and reactive to light, neck supple Cardiovascular: Normal rate, regular rhythm and normal heart sounds.  No murmur heard. No BLE edema. Pulmonary/Chest: Effort normal and breath sounds normal. No respiratory distress. Abdominal: Soft.  There is no tenderness. Psychiatric: Patient has a normal mood and affect. behavior is normal. Judgment and thought content normal.   Recent Results (from the past 2160 hour(s))  Group A Strep by PCR     Status: None   Collection Time: 01/23/21  1:37 AM   Specimen: Throat; Sterile Swab  Result Value Ref Range   Group A Strep by PCR NOT DETECTED NOT DETECTED    Comment: Performed at [facility], [address] [place], [place] [address]  SARS CORONAVIRUS 2 (TAT 6-24 HRS)  Nasopharyngeal Nasopharyngeal Swab     Status: Abnormal   Collection Time: 01/23/21  4:38 AM   Specimen: Nasopharyngeal Swab  Result Value Ref Range   SARS Coronavirus 2 POSITIVE (A) NEGATIVE    Comment: (NOTE) SARS-CoV-2 target nucleic acids are DETECTED.  The SARS-CoV-2 RNA is generally detectable in upper and lower respiratory specimens during the acute phase of infection. Positive results are indicative of the presence of SARS-CoV-2 RNA. Clinical correlation with patient history and other diagnostic information is  necessary to determine patient infection status. Positive results do not rule out bacterial infection or co-infection with other viruses.  The expected result is Negative.  Fact Sheet for Patients: SugarRoll.be  Fact Sheet for Healthcare Providers: https://www.woods-mathews.com/  This test is not yet approved or cleared by the [place] FDA and  has been authorized for detection and/or diagnosis of SARS-CoV-2 by FDA under an Emergency Use Authorization (EUA). This EUA will remain  in effect (meaning this test can be used) for the duration of the COVID-19 declaration under Section 564(b)(1) of the Act, 21 U. S.C. section 360bbb-3(b)(1), unless the authorization is terminated or revoked sooner.   Performed at [facility], [place] [address], [place], [place] [id number]   Basic Metabolic Panel (BMET)     Status: Abnormal   Collection Time: 01/30/21  9:57 AM  Result Value Ref Range   Sodium 140 135 - 145 mmol/L   Potassium 3.9 3.5 - 5.1 mmol/L   Chloride 103 98 - 111 mmol/L   CO2 26 22 - 32 mmol/L   Glucose, Bld 105 (H) 70 - 99 mg/dL    Comment: Glucose reference range applies only to samples taken after fasting for at least 8 hours.   BUN 10 6 - 20 mg/dL   Creatinine, Ser 0.47 0.44 - 1.00 mg/dL   Calcium 9.7 8.9 - 10.3 mg/dL   GFR, Estimated >60 >60 mL/min    Comment: (NOTE) Calculated using the CKD-EPI  Creatinine Equation (2021)    Anion gap 11 5 - 15    Comment: Performed at [facility], [address] [place], [place] [address]  CBC with Differential/Platelet     Status: Abnormal   Collection Time: 01/30/21  9:57 AM  Result Value Ref Range   WBC 4.0 4.0 - 10.5 K/uL   RBC 4.78 3.87 - 5.11 MIL/uL   Hemoglobin 13.9 12.0 - 15.0 g/dL   HCT 41.8 36.0 - 46.0 %   MCV 87.4 80.0 - 100.0 fL   MCH 29.1 26.0 - 34.0 pg   MCHC 33.3 30.0 - 36.0 g/dL   RDW 13.2 11.5 - 15.5 %   Platelets 309 150 - 400 K/uL   nRBC 0.0 0.0 - 0.2 %   Neutrophils Relative % 31 %   Neutro Abs 1.2 (L) 1.7 - 7.7 K/uL   Lymphocytes Relative 58 %   Lymphs Abs 2.3 0.7 - 4.0 K/uL   Monocytes Relative 8 %   Monocytes Absolute 0.3 0.1 - 1.0 K/uL   Eosinophils Relative 2 %   Eosinophils Absolute 0.1 0.0 - 0.5 K/uL   Basophils Relative 1 %   Basophils Absolute 0.0 0.0 - 0.1 K/uL   Immature Granulocytes 0 %   Abs Immature Granulocytes 0.01 0.00 - 0.07 K/uL    Comment: Performed at [facility], [address] [place], [place] [address]  POCT HgB A1C     Status: Abnormal   Collection Time: 03/21/21 10:07 AM  Result Value Ref Range   Hemoglobin A1C 6.1 (A) 4.0 - 5.6 %   HbA1c POC (<> result, manual entry)     HbA1c, POC (prediabetic range)     HbA1c, POC (controlled diabetic range)       PHQ2/9: Depression screen [facility] 2/9 03/21/2021 01/30/2021 10/30/2020 09/09/2020 05/30/2020  Decreased Interest 0 0 0 1 0  Down, Depressed, Hopeless 0 0 1 1 0  PHQ - 2 Score 0 0 1 2 0  Altered sleeping - - 0 2 -  Tired, decreased energy - - 0 1 -  Change in appetite - - 0 2 -  Feeling bad or failure about yourself  - - 0 0 -  Trouble concentrating - - 0 1 -  Moving slowly or fidgety/restless - - 0 0 -  Suicidal thoughts - - 0 0 -  PHQ-9 Score - - 1 8 -  Difficult doing work/chores - - - Not difficult at all -  Some recent data might be hidden    phq 9 is negative   Fall Risk: Fall Risk  03/21/2021 01/30/2021  10/30/2020 09/09/2020 05/30/2020  Falls in the past year? 0 0 '1 1 1  '$ Number falls in past yr: 0 0 0 0 1  Injury with Fall? 0 0 0 1 0  Risk for fall due to : - - - History of fall(s) History of fall(s)  Follow up Falls evaluation completed - - - Falls evaluation completed;Education provided     Functional Status Survey: Is the patient deaf or have difficulty hearing?: No Does the patient have difficulty seeing, even when wearing glasses/contacts?: No Does the patient have difficulty concentrating, remembering, or making decisions?: No Does the patient have difficulty walking or climbing stairs?: No Does the patient have difficulty dressing or bathing?: No Does the patient have difficulty doing errands alone such as visiting a doctor's office or shopping?: No    Assessment & Plan  1. Type 2 diabetes mellitus with microalbuminuria,  without long-term current use of insulin (HCC)  - POCT HgB A1C - tirzepatide (MOUNJARO) 5 MG/0.5ML Pen; Inject 5 mg into the skin once a week.  Dispense: 6 mL; Refill: 0  2. Need for pneumococcal vaccination  - Pneumococcal conjugate vaccine 20-valent (Prevnar 20)  3. Need for shingles vaccine  - Varicella-zoster vaccine IM (Shingrix)  4. Dyslipidemia  - rosuvastatin (CRESTOR) 10 MG tablet; Take 1 tablet (10 mg total) by mouth daily.  Dispense: 90 tablet; Refill: 1  5. Dyslipidemia associated with type 2 diabetes mellitus (HCC)  - rosuvastatin (CRESTOR) 10 MG tablet; Take 1 tablet (10 mg total) by mouth daily.  Dispense: 90 tablet; Refill: 1 - tirzepatide (MOUNJARO) 5 MG/0.5ML Pen; Inject 5 mg into the skin once a week.  Dispense: 6 mL; Refill: 0  6. Benign hypertension  - lisinopril (ZESTRIL) 10 MG tablet; Take 1 tablet (10 mg total) by mouth daily.  Dispense: 90 tablet; Refill: 1  7. Microalbuminuria  - lisinopril (ZESTRIL) 10 MG tablet; Take 1 tablet (10 mg total) by mouth daily.  Dispense: 90 tablet; Refill: 1

## 2021-03-21 ENCOUNTER — Ambulatory Visit (INDEPENDENT_AMBULATORY_CARE_PROVIDER_SITE_OTHER): Payer: BC Managed Care – PPO | Admitting: Family Medicine

## 2021-03-21 ENCOUNTER — Other Ambulatory Visit: Payer: Self-pay

## 2021-03-21 ENCOUNTER — Encounter: Payer: Self-pay | Admitting: Family Medicine

## 2021-03-21 VITALS — BP 132/78 | HR 83 | Temp 98.6°F | Resp 18 | Ht 61.0 in | Wt 251.7 lb

## 2021-03-21 DIAGNOSIS — E785 Hyperlipidemia, unspecified: Secondary | ICD-10-CM

## 2021-03-21 DIAGNOSIS — I10 Essential (primary) hypertension: Secondary | ICD-10-CM

## 2021-03-21 DIAGNOSIS — R809 Proteinuria, unspecified: Secondary | ICD-10-CM

## 2021-03-21 DIAGNOSIS — Z23 Encounter for immunization: Secondary | ICD-10-CM

## 2021-03-21 DIAGNOSIS — E1169 Type 2 diabetes mellitus with other specified complication: Secondary | ICD-10-CM | POA: Diagnosis not present

## 2021-03-21 DIAGNOSIS — E1129 Type 2 diabetes mellitus with other diabetic kidney complication: Secondary | ICD-10-CM | POA: Diagnosis not present

## 2021-03-21 LAB — POCT GLYCOSYLATED HEMOGLOBIN (HGB A1C): Hemoglobin A1C: 6.1 % — AB (ref 4.0–5.6)

## 2021-03-21 MED ORDER — ROSUVASTATIN CALCIUM 10 MG PO TABS: 10.0000 mg | ORAL_TABLET | Freq: Every day | ORAL | 1 refills | Status: DC

## 2021-03-21 MED ORDER — LISINOPRIL 10 MG PO TABS: 10.0000 mg | ORAL_TABLET | Freq: Every day | ORAL | 1 refills | Status: DC

## 2021-03-21 MED ORDER — MOUNJARO 5 MG/0.5ML ~~LOC~~ SOAJ
5.0000 mg | SUBCUTANEOUS | 0 refills | Status: DC
Start: 2021-03-21 — End: 2021-09-09

## 2021-03-21 NOTE — Patient Instructions (Signed)
Debra Farmer MD- gastroenterologist

## 2021-03-26 ENCOUNTER — Ambulatory Visit
Admission: RE | Admit: 2021-03-26 | Discharge: 2021-03-26 | Disposition: A | Discharge status: Home / Self Care | Payer: BC Managed Care – PPO | Source: Ambulatory Visit | Attending: Family Medicine | Admitting: Family Medicine

## 2021-03-26 ENCOUNTER — Other Ambulatory Visit: Payer: Self-pay

## 2021-03-26 DIAGNOSIS — Z1231 Encounter for screening mammogram for malignant neoplasm of breast: Secondary | ICD-10-CM | POA: Insufficient documentation

## 2021-04-26 ENCOUNTER — Other Ambulatory Visit: Payer: Self-pay | Admitting: Family Medicine

## 2021-04-26 DIAGNOSIS — E1169 Type 2 diabetes mellitus with other specified complication: Secondary | ICD-10-CM

## 2021-04-26 DIAGNOSIS — E785 Hyperlipidemia, unspecified: Secondary | ICD-10-CM

## 2021-04-26 NOTE — Telephone Encounter (Signed)
Requested medications are due for refill today NO  Requested medications are on the active medication list NO  Last refill 01/25/21  Last visit 03/21/21  Future visit scheduled 07/01/21  Notes to clinic Dose inconsistent with current med list, please assess.

## 2021-04-28 NOTE — Telephone Encounter (Signed)
Spoke with pharmacy, Patient picked up 90-day supply on 04/02/2021. Pharmacy updated refill request status.

## 2021-07-01 ENCOUNTER — Ambulatory Visit: Payer: BC Managed Care – PPO | Admitting: Family Medicine

## 2021-07-24 ENCOUNTER — Other Ambulatory Visit: Payer: Self-pay | Admitting: Family Medicine

## 2021-07-24 DIAGNOSIS — E785 Hyperlipidemia, unspecified: Secondary | ICD-10-CM

## 2021-07-24 NOTE — Telephone Encounter (Signed)
Requested medications are due for refill today.  no  Requested medications are on the active medications list.  no  Last refill. 10/30/2020  Future visit scheduled.   yes  Notes to clinic.  The dosage of this medication was changed to 10 mg on 03/21/2021.    Requested Prescriptions  Pending Prescriptions Disp Refills   rosuvastatin (CRESTOR) 5 MG tablet [Pharmacy Med Name: Rosuvastatin Calcium 5 MG Oral Tablet] 90 tablet 0    Sig: Take 1 tablet by mouth once daily     Cardiovascular:  Antilipid - Statins Failed - 07/24/2021  2:39 AM      Failed - Total Cholesterol in normal range and within 360 days    Cholesterol, Total  Date Value Ref Range Status  06/04/2015 160 100 - 199 mg/dL Final   Cholesterol  Date Value Ref Range Status  10/30/2020 208 (H) <200 mg/dL Final          Failed - LDL in normal range and within 360 days    LDL Cholesterol (Calc)  Date Value Ref Range Status  10/30/2020 118 (H) mg/dL (calc) Final    Comment:    Reference range: <100 . Desirable range <100 mg/dL for primary prevention;   <70 mg/dL for patients with CHD or diabetic patients  with > or = 2 CHD risk factors. Marland Kitchen LDL-C is now calculated using the Martin-Hopkins  calculation, which is a validated novel method providing  better accuracy than the Friedewald equation in the  estimation of LDL-C.  Cresenciano Genre et al. Annamaria Helling. 9450;388(82): 2061-2068  (http://education.[employer].com/faq/FAQ164)           Failed - HDL in normal range and within 360 days    HDL  Date Value Ref Range Status  10/30/2020 47 (L) > OR = 50 mg/dL Final  06/04/2015 40 >39 mg/dL Final    Comment:    According to ATP-III Guidelines, HDL-C >59 mg/dL is considered a negative risk factor for CHD.           Failed - Triglycerides in normal range and within 360 days    Triglycerides  Date Value Ref Range Status  10/30/2020 302 (H) <150 mg/dL Final    Comment:    . If a non-fasting specimen was collected,  consider repeat triglyceride testing on a fasting specimen if clinically indicated.  Yates Decamp et al. J. of Clin. Lipidol. 8003;4:917-915. Marland Kitchen           Passed - Patient is not pregnant      Passed - Valid encounter within last 12 months    Recent Outpatient Visits           4 months ago Type 2 diabetes mellitus with microalbuminuria, without long-term current use of insulin [facility])   [facility] Steele Sizer, MD   5 months ago COVID-19   [facility] Steele Sizer, MD   8 months ago Type 2 diabetes mellitus with microalbuminuria, without long-term current use of insulin [facility])   [facility] Steele Sizer, MD   10 months ago Well adult health check   [facility] Steele Sizer, MD   1 year ago Type 2 diabetes mellitus with microalbuminuria, without long-term current use of insulin [facility])   [facility] Steele Sizer, MD       Future Appointments             In 1 month Ancil Boozer, Drue Stager, MD [facility]

## 2021-07-24 NOTE — Telephone Encounter (Signed)
Called pt to confirm her dosage, pt states she's currently not home but she remembers her dosage changed from 5 to 10mg . Pt clarifies she was taking what was last prescribed which shows 10mg .

## 2021-09-08 NOTE — Progress Notes (Signed)
Name: Debra Contreras   MRN: [id number]    DOB: 1966/06/15   Date:09/09/2021       Progress Note  Subjective  Chief Complaint  Follow Up  HPI  Thyroid nodule: she was sitting on her desk working on a report and felt a large nodule on the left side back in October 2016, she had a partial thyroidectomy ( removal of left side) in March 2017, and is doing well since. Last TSH was at goal. Denies dysphagia, hair loss or change in bowel movements    DMII with renal manifestation, gingivitis, obesity, dyslipidemia: no longer taking Metformin but still takes Lisinopril 10 mg for proteinuria, urine micro was elevated in the past at  50 but last level was back to normal. She is not checking glucose at home.  She denies polyphagia, polydipsia or polyuria Today it is 6 % .  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 Weight went down to 186 lbs ( lowest after bariatric surgery ). Weight was 251.7 lbs but today is up to 261 lbs. She was fasting for church in January .She has not been physically active lately. 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 Currently working in [employer]   HTN: she is compliant with medication, denies chest pain, palpitation or dizziness. BP is at goal    Dyslipidemia: she is on higher dose of Crestor now , we went up from 5 mg to 10 mg daily. 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   History of partial thyroidectomy 10/05/2015   Morbid obesity ([place]) 06/04/2015   Breast lump in female 05/17/2015   Acanthosis nigricans 03/09/2015   Allergic rhinitis, seasonal 03/09/2015   Diabetes mellitus with renal manifestation ([place]) 03/09/2015   Dyslipidemia 03/09/2015   Gastro-esophageal reflux disease without esophagitis 03/09/2015   Hiatal hernia 03/09/2015   History of anemia 03/09/2015   H/O: hysterectomy 03/09/2015   Calculus of kidney 03/09/2015   Benign hypertension 03/09/2015   Vitamin D deficiency 03/09/2015   Apnea, sleep 09/12/2014   Microalbuminuria 05/29/2014    Past Surgical History:  Procedure Laterality Date   ABDOMINAL HYSTERECTOMY  03/2012   BREAST BIOPSY Right 12/2005   Normal-Cyst   BREAST BIOPSY Right 12/2005   BREAST REDUCTION SURGERY     BREAST SURGERY Bilateral 1990   Reduction   BREAST SURGERY Bilateral 1990   COLONOSCOPY WITH PROPOFOL N/A 05/07/2017   Procedure: COLONOSCOPY WITH PROPOFOL;  Surgeon: Jonathon Bellows, MD;  Location: [facility] ENDOSCOPY;  Service: Gastroenterology;  Laterality: N/A;   COLONOSCOPY WITH PROPOFOL N/A  10/14/2020   Procedure: COLONOSCOPY WITH PROPOFOL;  Surgeon: Lesly Rubenstein, MD;  Location: [facility] ENDOSCOPY;  Service: Endoscopy;  Laterality: N/A;   OOPHORECTOMY Left 03/2011   REDUCTION MAMMAPLASTY Bilateral    REDUCTION MAMMAPLASTY Bilateral    SLEEVE GASTROPLASTY  09/2016   [place]   thyroidectomy Left 10/08/2015    Family History  Problem Relation Age of Onset   Hypertension Mother    Cirrhosis Mother    COPD Mother        Heavy Smoker   Hepatitis C Mother    Alcohol abuse Father    Heart disease Father    Heart attack Father    Diabetes Brother    Obesity Brother    Lung cancer Maternal Grandmother        Snuff   Diabetes Maternal  Grandmother    Alcoholism Maternal Grandmother    Prostate cancer Paternal Grandfather    Alcoholism Maternal Grandfather    Diabetes Maternal Grandfather    Breast cancer Cousin     Social History   Tobacco Use   Smoking status: Never   Smokeless tobacco: Never  Substance Use Topics   Alcohol use: Yes    Alcohol/week: 0.0 standard drinks    Comment: wine-social drinker     Current Outpatient Medications:    Biotin 5000 MCG TABS, Take 5,000 mg by mouth 3 (three) times a week., Disp: , Rfl:    Cholecalciferol (VITAMIN D) 2000 units CAPS, Take 1 capsule (2,000 Units total) by mouth daily., Disp: 30 capsule, Rfl:    ELDERBERRY PO, Take by mouth., Disp: , Rfl:    Flaxseed, Linseed, (FLAXSEED OIL PO), Take by mouth., Disp: , Rfl:    fluticasone (FLONASE) 50 MCG/ACT nasal spray, Place 2 sprays into both nostrils daily., Disp: 16 g, Rfl: 6   hydrocortisone cream 1 %, Apply 1 application topically as needed for itching., Disp: , Rfl:    loratadine (CLARITIN) 10 MG tablet, Take by mouth., Disp: , Rfl:    Multiple Vitamin (MULTIVITAMIN) tablet, Take 1 tablet by mouth daily., Disp: , Rfl:    Semaglutide, 1 MG/DOSE, 4 MG/3ML SOPN, Inject 1 mg as directed once a week., Disp: 9 mL, Rfl: 0   Thiamine HCl (VITAMIN B-1 PO), Take 500 mg by mouth daily., Disp: , Rfl:    Turmeric 500 MG CAPS, Take by mouth., Disp: , Rfl:    vitamin B-12 (CYANOCOBALAMIN) 1000 MCG tablet, Take 1,000 mcg by mouth 2 (two) times a week., Disp: , Rfl:    vitamin C (ASCORBIC ACID) 500 MG tablet, Take 500 mg by mouth daily., Disp: , Rfl:    lisinopril (ZESTRIL) 10 MG tablet, Take 1 tablet (10 mg total) by mouth daily., Disp: 90 tablet, Rfl: 1   rosuvastatin (CRESTOR) 10 MG tablet, Take 1 tablet (10 mg total) by mouth daily., Disp: 90 tablet, Rfl: 1  Allergies  Allergen Reactions   Zinc     Nausea, vomiting and diarrhea    Latex Rash    I personally reviewed active problem list, medication list, allergies, family  history, social history, health maintenance with the patient/caregiver today.   ROS  Constitutional: Negative for fever , positive for  weight change.  Respiratory: Negative for cough and shortness of breath.   Cardiovascular: Negative for chest pain or palpitations.  Gastrointestinal: Negative for abdominal pain, no bowel changes.  Musculoskeletal: Negative for gait problem or joint swelling.  Skin: Negative for rash.  Neurological: Negative for dizziness or headache.  No  other specific complaints in a complete review of systems (except as listed in HPI above).   Objective  Vitals:   09/09/21 1333  BP: 120/78  Pulse: 94  Resp: 16  SpO2: 97%  Weight: 261 lb (118.4 kg)  Height: 5\' 1"  (1.549 m)     Body mass index is 49.32 kg/m.  Physical Exam  Constitutional: Patient appears well-developed and well-nourished. Obese  No distress.  HEENT: head atraumatic, normocephalic, pupils equal and reactive to light, neck supple Cardiovascular: Normal rate, regular rhythm and normal heart sounds.  No murmur heard. No BLE edema. Pulmonary/Chest: Effort normal and breath sounds normal. No respiratory distress. Abdominal: Soft.  There is no tenderness. Psychiatric: Patient has a normal mood and affect. behavior is normal. Judgment and thought content normal.   Recent Results (from the past 2160 hour(s))  POCT HgB A1C     Status: Abnormal   Collection Time: 09/09/21  1:42 PM  Result Value Ref Range   Hemoglobin A1C 6.0 (A) 4.0 - 5.6 %   HbA1c POC (<> result, manual entry)     HbA1c, POC (prediabetic range)     HbA1c, POC (controlled diabetic range)      PHQ2/9: Depression screen [facility] 2/9 09/09/2021 03/21/2021 01/30/2021 10/30/2020 09/09/2020  Decreased Interest 0 0 0 0 1  Down, Depressed, Hopeless 0 0 0 1 1  PHQ - 2 Score 0 0 0 1 2  Altered sleeping 0 - - 0 2  Tired, decreased energy 0 - - 0 1  Change in appetite 0 - - 0 2  Feeling bad or failure about yourself  0 - - 0 0  Trouble  concentrating 0 - - 0 1  Moving slowly or fidgety/restless 0 - - 0 0  Suicidal thoughts 0 - - 0 0  PHQ-9 Score 0 - - 1 8  Difficult doing work/chores - - - - Not difficult at all  Some recent data might be hidden    phq 9 is negative   Fall Risk: Fall Risk  09/09/2021 03/21/2021 01/30/2021 10/30/2020 09/09/2020  Falls in the past year? 0 0 0 1 1  Number falls in past yr: 0 0 0 0 0  Injury with Fall? 0 0 0 0 1  Risk for fall due to : No Fall Risks - - - History of fall(s)  Follow up Falls prevention discussed Falls evaluation completed - - -      Functional Status Survey: Is the patient deaf or have difficulty hearing?: No Does the patient have difficulty seeing, even when wearing glasses/contacts?: No Does the patient have difficulty concentrating, remembering, or making decisions?: No Does the patient have difficulty walking or climbing stairs?: No Does the patient have difficulty dressing or bathing?: No Does the patient have difficulty doing errands alone such as visiting a doctor's office or shopping?: No    Assessment & Plan  1. Type 2 diabetes mellitus with microalbuminuria, without long-term current use of insulin (HCC)  - POCT HgB A1C - Semaglutide, 1 MG/DOSE, 4 MG/3ML SOPN; Inject 1 mg as directed once a week.  Dispense: 9 mL; Refill: 0  2. Dyslipidemia associated with type 2 diabetes mellitus (HCC)  - Semaglutide, 1 MG/DOSE, 4 MG/3ML SOPN; Inject 1 mg as directed once a week.  Dispense: 9 mL; Refill: 0 - rosuvastatin (CRESTOR) 10 MG tablet; Take 1 tablet (10 mg total) by mouth daily.  Dispense: 90 tablet; Refill: 1  3. Morbid obesity ([place])  Discussed with the patient the  risk posed by an increased BMI. Discussed importance of portion control, calorie counting and at least 150 minutes of physical activity weekly. Avoid sweet beverages and drink more water. Eat at least 6 servings of fruit and vegetables daily    4. B12 deficiency   5. Benign hypertension  -  lisinopril (ZESTRIL) 10 MG tablet; Take 1 tablet (10 mg total) by mouth daily.  Dispense: 90 tablet; Refill: 1  6. Vitamin D deficiency   7. Microalbuminuria  - lisinopril (ZESTRIL) 10 MG tablet; Take 1 tablet (10 mg total) by mouth daily.  Dispense: 90 tablet; Refill: 1  8. Vitamin B1 deficiency   9. OSA (obstructive sleep apnea)   10. History of bariatric surgery   11. History of partial thyroidectomy   12. History of colonic polyps  - Ambulatory referral to Gastroenterology  13. Dyslipidemia  - rosuvastatin (CRESTOR) 10 MG tablet; Take 1 tablet (10 mg total) by mouth daily.  Dispense: 90 tablet; Refill: 1

## 2021-09-09 ENCOUNTER — Ambulatory Visit (INDEPENDENT_AMBULATORY_CARE_PROVIDER_SITE_OTHER): Payer: Self-pay | Admitting: Family Medicine

## 2021-09-09 ENCOUNTER — Encounter: Payer: Self-pay | Admitting: Family Medicine

## 2021-09-09 VITALS — BP 120/78 | HR 94 | Resp 16 | Ht 61.0 in | Wt 261.0 lb

## 2021-09-09 DIAGNOSIS — G4733 Obstructive sleep apnea (adult) (pediatric): Secondary | ICD-10-CM

## 2021-09-09 DIAGNOSIS — Z9884 Bariatric surgery status: Secondary | ICD-10-CM

## 2021-09-09 DIAGNOSIS — E785 Hyperlipidemia, unspecified: Secondary | ICD-10-CM

## 2021-09-09 DIAGNOSIS — I10 Essential (primary) hypertension: Secondary | ICD-10-CM

## 2021-09-09 DIAGNOSIS — E538 Deficiency of other specified B group vitamins: Secondary | ICD-10-CM

## 2021-09-09 DIAGNOSIS — E1169 Type 2 diabetes mellitus with other specified complication: Secondary | ICD-10-CM

## 2021-09-09 DIAGNOSIS — E519 Thiamine deficiency, unspecified: Secondary | ICD-10-CM

## 2021-09-09 DIAGNOSIS — Z86010 Personal history of colonic polyps: Secondary | ICD-10-CM

## 2021-09-09 DIAGNOSIS — E559 Vitamin D deficiency, unspecified: Secondary | ICD-10-CM

## 2021-09-09 DIAGNOSIS — E890 Postprocedural hypothyroidism: Secondary | ICD-10-CM

## 2021-09-09 DIAGNOSIS — E1129 Type 2 diabetes mellitus with other diabetic kidney complication: Secondary | ICD-10-CM

## 2021-09-09 DIAGNOSIS — R809 Proteinuria, unspecified: Secondary | ICD-10-CM

## 2021-09-09 LAB — POCT GLYCOSYLATED HEMOGLOBIN (HGB A1C): Hemoglobin A1C: 6 % — AB (ref 4.0–5.6)

## 2021-09-09 MED ORDER — SEMAGLUTIDE (1 MG/DOSE) 4 MG/3ML ~~LOC~~ SOPN: 1.0000 mg | PEN_INJECTOR | SUBCUTANEOUS | 0 refills | Status: DC

## 2021-09-09 MED ORDER — ROSUVASTATIN CALCIUM 10 MG PO TABS: 10.0000 mg | ORAL_TABLET | Freq: Every day | ORAL | 1 refills | Status: DC

## 2021-09-09 MED ORDER — LISINOPRIL 10 MG PO TABS: 10.0000 mg | ORAL_TABLET | Freq: Every day | ORAL | 1 refills | Status: DC

## 2021-09-11 ENCOUNTER — Encounter: Payer: Self-pay | Admitting: Family Medicine

## 2021-09-30 ENCOUNTER — Telehealth: Payer: Self-pay | Admitting: Family Medicine

## 2021-09-30 NOTE — Telephone Encounter (Signed)
Copied from [place] [id number]. Topic: General - Other >> Sep 30, 2021  1:45 PM Leward Quan A wrote: Reason for CRM: Antony Madura with [facility] called in to inquire of Dr Ancil Boozer about fax that was sent a fax on 09/29/21 in reference to  [facility], 1 MG/DOSE, 4 MG/3ML SOPN and again today 09/30/21 for Dx and office notes Ph# [phone]

## 2021-09-30 NOTE — Telephone Encounter (Signed)
Awaiting signature from Dr. Ancil Boozer.

## 2021-11-24 NOTE — Progress Notes (Signed)
Name: Debra Contreras   MRN: [id number]    DOB: 11/30/1965   Date:11/25/2021 ? ?     Progress Note ? ?Subjective ? ?Chief Complaint ? ?Annual Exam ? ?HPI ? ?Patient presents for annual CPE. ? ?Diet: reminded her of diabetic diet  ?Exercise: discussed 150 minutes per week   ? ?[place] Office Visit from 03/21/2021 in [facility]  ?AUDIT-C Score 0  ? ?  ? ?Depression: Phq 9 is  negative ? ?  11/25/2021  ?  8:46 AM 09/09/2021  ?  1:38 PM 03/21/2021  ?  9:50 AM 01/30/2021  ?  7:39 AM 10/30/2020  ?  8:06 AM  ?Depression screen PHQ 2/9  ?Decreased Interest 0 0 0 0 0  ?Down, Depressed, Hopeless 0 0 0 0 1  ?PHQ - 2 Score 0 0 0 0 1  ?Altered sleeping 0 0   0  ?Tired, decreased energy 0 0   0  ?Change in appetite 0 0   0  ?Feeling bad or failure about yourself  0 0   0  ?Trouble concentrating 0 0   0  ?Moving slowly or fidgety/restless 0 0   0  ?Suicidal thoughts 0 0   0  ?PHQ-9 Score 0 0   1  ? ?Hypertension: ?BP Readings from Last 3 Encounters:  ?11/25/21 132/76  ?09/09/21 120/78  ?03/21/21 132/78  ? ?Obesity: ?Wt Readings from Last 3 Encounters:  ?11/25/21 266 lb (120.7 kg)  ?09/09/21 261 lb (118.4 kg)  ?03/21/21 251 lb 11.2 oz (114.2 kg)  ? ?BMI Readings from Last 3 Encounters:  ?11/25/21 50.26 kg/m?  ?09/09/21 49.32 kg/m?  ?03/21/21 47.56 kg/m?  ?  ? ?Vaccines:  ? ?HPV: N/A ?Tdap: up to date ?Shingrix: up to date ?Pneumonia: up to date  ?Flu: up to date ?COVID-19: discussed bivalent booster  ? ? ?Hep C Screening: 01/10/14 ?STD testing and prevention (HIV/chl/gon/syphilis): N/A ?Intimate partner violence: negative screen  ?Sexual History :not currently sexually active  ?Menstrual History/LMP/Abnormal Bleeding: s/p hysterectomy for benign causes  ?Discussed importance of follow up if any post-menopausal bleeding: not applicable  ?Incontinence Symptoms: negative for symptoms  ? ?Breast cancer:  ?- Last Mammogram: 03/26/21 ?- BRCA gene screening: colon cancer maternal uncle and maternal cousin , also a second  cousin on mother's side. She is due for colonoscopy and discussed genetic testing - she will contact them for a follow up ? ?Osteoporosis Prevention : Discussed high calcium and vitamin D supplementation, weight bearing exercises ?Bone density :discussed with patient today  ? ?Cervical cancer screening: N/A - s/p hysterectomy  ? ?Skin cancer: Discussed monitoring for atypical lesions  ?Colorectal cancer: 10/21/20   ?Lung cancer:  Low Dose CT Chest recommended if Age 24-80 years, 20 pack-year currently smoking OR have quit w/in 15years. Patient does not qualify for screen   ?ECG: 09/20/12 ? ?Advanced Care Planning: A voluntary discussion about advance care planning including the explanation and discussion of advance directives.  Discussed health care proxy and Living will, and the patient was able to identify a health care proxy as mother .  Patient does not  have a living will and power of attorney of health care  ? ?Lipids: ?Lab Results  ?Component Value Date  ? CHOL 208 (H) 10/30/2020  ? CHOL 199 01/26/2020  ? CHOL 297 (H) 10/25/2019  ? ?Lab Results  ?Component Value Date  ? HDL 47 (L) 10/30/2020  ? HDL 51 01/26/2020  ? HDL 49 (L) 10/25/2019  ? ?  Lab Results  ?Component Value Date  ? LDLCALC 118 (H) 10/30/2020  ? LDLCALC 116 (H) 01/26/2020  ? [place] 207 (H) 10/25/2019  ? ?Lab Results  ?Component Value Date  ? TRIG 302 (H) 10/30/2020  ? TRIG 200 (H) 01/26/2020  ? TRIG 215 (H) 10/25/2019  ? ?Lab Results  ?Component Value Date  ? CHOLHDL 4.4 10/30/2020  ? CHOLHDL 3.9 01/26/2020  ? CHOLHDL 6.1 (H) 10/25/2019  ? ?No results found for: LDLDIRECT ? ?Glucose: ?Glucose, Bld  ?Date Value Ref Range Status  ?01/30/2021 105 (H) 70 - 99 mg/dL Final  ?  Comment:  ?  Glucose reference range applies only to samples taken after fasting for at least 8 hours.  ?10/30/2020 119 (H) 65 - 99 mg/dL Final  ?  Comment:  ?  . ?           Fasting reference interval ?. ?For someone without known diabetes, a glucose value ?between 100 and 125  mg/dL is consistent with ?prediabetes and should be confirmed with a ?follow-up test. ?. ?  ?01/26/2020 90 65 - 99 mg/dL Final  ?  Comment:  ?  . ?           Fasting reference interval ?. ?  ? ? ?Patient Active Problem List  ? Diagnosis Date Noted  ? 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  ? History of partial thyroidectomy 10/05/2015  ? Morbid obesity ([place]) 06/04/2015  ? Breast lump in female 05/17/2015  ? Acanthosis nigricans 03/09/2015  ? Allergic rhinitis, seasonal 03/09/2015  ? Diabetes mellitus with renal manifestation ([place]) 03/09/2015  ? Dyslipidemia 03/09/2015  ? Gastro-esophageal reflux disease without esophagitis 03/09/2015  ? Hiatal hernia 03/09/2015  ? History of anemia 03/09/2015  ? H/O: hysterectomy 03/09/2015  ? Calculus of kidney 03/09/2015  ? Benign hypertension 03/09/2015  ? Vitamin D deficiency 03/09/2015  ? Apnea, sleep 09/12/2014  ? Microalbuminuria 05/29/2014  ? ? ?Past Surgical History:  ?Procedure Laterality Date  ? ABDOMINAL HYSTERECTOMY  03/2012  ? BREAST BIOPSY Right 12/2005  ? Normal-Cyst  ? BREAST BIOPSY Right 12/2005  ? BREAST REDUCTION SURGERY    ? BREAST SURGERY Bilateral 1990  ? Reduction  ? BREAST SURGERY Bilateral 1990  ? COLONOSCOPY WITH PROPOFOL N/A 05/07/2017  ? Procedure: COLONOSCOPY WITH PROPOFOL;  Surgeon: Jonathon Bellows, MD;  Location: [facility] ENDOSCOPY;  Service: Gastroenterology;  Laterality: N/A;  ? COLONOSCOPY WITH PROPOFOL N/A 10/14/2020  ? Procedure: COLONOSCOPY WITH PROPOFOL;  Surgeon: Lesly Rubenstein, MD;  Location: [facility] ENDOSCOPY;  Service: Endoscopy;  Laterality: N/A;  ? OOPHORECTOMY Left 03/2011  ? REDUCTION MAMMAPLASTY Bilateral   ? REDUCTION MAMMAPLASTY Bilateral   ? SLEEVE GASTROPLASTY  09/2016  ? [place]  ? thyroidectomy Left 10/08/2015  ? ? ?Family History  ?Problem Relation Age of Onset  ? Hypertension Mother   ? Cirrhosis Mother   ? COPD Mother   ?     Heavy Smoker  ? Hepatitis C Mother   ? Alcohol abuse Father   ? Heart disease Father   ? Heart attack Father   ?  Diabetes Brother   ? Obesity Brother   ? Lung cancer Maternal Grandmother   ?     Snuff  ? Diabetes Maternal Grandmother   ? Alcoholism Maternal Grandmother   ? Prostate cancer Paternal Grandfather   ? Alcoholism Maternal Grandfather   ? Diabetes Maternal Grandfather   ? Breast cancer Cousin   ? ? ?Social History  ? ?Socioeconomic History  ? Marital  status: Divorced  ?  Spouse name: Not on file  ? Number of children: 0  ? Years of education: Not on file  ? Highest education level: Bachelor's degree (e.g., BA, AB, BS)  ?Occupational History  ? Occupation: Designer, jewellery   ?Tobacco Use  ? Smoking status: Never  ? Smokeless tobacco: Never  ?Vaping Use  ? Vaping Use: Never used  ?Substance and Sexual Activity  ? Alcohol use: Yes  ?  Alcohol/week: 0.0 standard drinks  ?  Comment: wine-social drinker  ? Drug use: No  ? Sexual activity: Yes  ?  Partners: Male  ?Other Topics Concern  ? Not on file  ?Social History Narrative  ? Her grown up niece moved in with her May 2020   ? She is a Designer, jewellery.   ? ?Social Determinants of Health  ? ?Financial Resource Strain: Low Risk   ? Difficulty of Paying Living Expenses: Not very hard  ?Food Insecurity: No Food Insecurity  ? Worried About Charity fundraiser in the Last Year: Never true  ? Ran Out of Food in the Last Year: Never true  ?Transportation Needs: No Transportation Needs  ? Lack of Transportation (Medical): No  ? Lack of Transportation (Non-Medical): No  ?Physical Activity: Inactive  ? Days of Exercise per Week: 0 days  ? Minutes of Exercise per Session: 0 min  ?Stress: Stress Concern Present  ? Feeling of Stress : Rather much  ?Social Connections: Moderately Integrated  ? Frequency of Communication with Friends and Family: More than three times a week  ? Frequency of Social Gatherings with Friends and Family: Once a week  ? Attends Religious Services: More than 4 times per year  ? Active Member of Clubs or Organizations: Yes  ? Attends Archivist Meetings:  1 to 4 times per year  ? Marital Status: Divorced  ?Intimate Partner Violence: Not on file  ? ? ? ?Current Outpatient Medications:  ?  Biotin 5000 MCG TABS, Take 5,000 mg by mouth 3 (three) times a week

## 2021-11-24 NOTE — Patient Instructions (Addendum)
GLP-1 agonist  ? ?Preventive Care 39-55 Years Old, Female ?Preventive care refers to lifestyle choices and visits with your health care provider that can promote health and wellness. Preventive care visits are also called wellness exams. ?What can I expect for my preventive care visit? ?Counseling ?Your health care provider may ask you questions about your: ?Medical history, including: ?Past medical problems. ?Family medical history. ?Pregnancy history. ?Current health, including: ?Menstrual cycle. ?Method of birth control. ?Emotional well-being. ?Home life and relationship well-being. ?Sexual activity and sexual health. ?Lifestyle, including: ?Alcohol, nicotine or tobacco, and drug use. ?Access to firearms. ?Diet, exercise, and sleep habits. ?Work and work Statistician. ?Sunscreen use. ?Safety issues such as seatbelt and bike helmet use. ?Physical exam ?Your health care provider will check your: ?Height and weight. These may be used to calculate your BMI (body mass index). BMI is a measurement that tells if you are at a healthy weight. ?Waist circumference. This measures the distance around your waistline. This measurement also tells if you are at a healthy weight and may help predict your risk of certain diseases, such as type 2 diabetes and high blood pressure. ?Heart rate and blood pressure. ?Body temperature. ?Skin for abnormal spots. ?What immunizations do I need? ? ?Vaccines are usually given at various ages, according to a schedule. Your health care provider will recommend vaccines for you based on your age, medical history, and lifestyle or other factors, such as travel or where you work. ?What tests do I need? ?Screening ?Your health care provider may recommend screening tests for certain conditions. This may include: ?Lipid and cholesterol levels. ?Diabetes screening. This is done by checking your blood sugar (glucose) after you have not eaten for a while (fasting). ?Pelvic exam and Pap test. ?Hepatitis B  test. ?Hepatitis C test. ?HIV (human immunodeficiency virus) test. ?STI (sexually transmitted infection) testing, if you are at risk. ?Lung cancer screening. ?Colorectal cancer screening. ?Mammogram. Talk with your health care provider about when you should start having regular mammograms. This may depend on whether you have a family history of breast cancer. ?BRCA-related cancer screening. This may be done if you have a family history of breast, ovarian, tubal, or peritoneal cancers. ?Bone density scan. This is done to screen for osteoporosis. ?Talk with your health care provider about your test results, treatment options, and if necessary, the need for more tests. ?Follow these instructions at home: ?Eating and drinking ? ?Eat a diet that includes fresh fruits and vegetables, whole grains, lean protein, and low-fat dairy products. ?Take vitamin and mineral supplements as recommended by your health care provider. ?Do not drink alcohol if: ?Your health care provider tells you not to drink. ?You are pregnant, may be pregnant, or are planning to become pregnant. ?If you drink alcohol: ?Limit how much you have to 0-1 drink a day. ?Know how much alcohol is in your drink. In the [place] one drink equals one 12 oz bottle of beer (355 mL), one 5 oz glass of wine (148 mL), or one 1? oz glass of hard liquor (44 mL). ?Lifestyle ?Brush your teeth every morning and night with fluoride toothpaste. Floss one time each day. ?Exercise for at least 30 minutes 5 or more days each week. ?Do not use any products that contain nicotine or tobacco. These products include cigarettes, chewing tobacco, and vaping devices, such as e-cigarettes. If you need help quitting, ask your health care provider. ?Do not use drugs. ?If you are sexually active, practice safe sex. Use a condom or other  form of protection to prevent STIs. ?If you do not wish to become pregnant, use a form of birth control. If you plan to become pregnant, see your health care  provider for a prepregnancy visit. ?Take aspirin only as told by your health care provider. Make sure that you understand how much to take and what form to take. Work with your health care provider to find out whether it is safe and beneficial for you to take aspirin daily. ?Find healthy ways to manage stress, such as: ?Meditation, yoga, or listening to music. ?Journaling. ?Talking to a trusted person. ?Spending time with friends and family. ?Minimize exposure to UV radiation to reduce your risk of skin cancer. ?Safety ?Always wear your seat belt while driving or riding in a vehicle. ?Do not drive: ?If you have been drinking alcohol. Do not ride with someone who has been drinking. ?When you are tired or distracted. ?While texting. ?If you have been using any mind-altering substances or drugs. ?Wear a helmet and other protective equipment during sports activities. ?If you have firearms in your house, make sure you follow all gun safety procedures. ?Seek help if you have been physically or sexually abused. ?What's next? ?Visit your health care provider once a year for an annual wellness visit. ?Ask your health care provider how often you should have your eyes and teeth checked. ?Stay up to date on all vaccines. ?This information is not intended to replace advice given to you by your health care provider. Make sure you discuss any questions you have with your health care provider. ?Document Revised: 01/15/2021 Document Reviewed: 01/15/2021 ?Elsevier Patient Education ? [employer]. ? ?

## 2021-11-25 ENCOUNTER — Ambulatory Visit (INDEPENDENT_AMBULATORY_CARE_PROVIDER_SITE_OTHER): Payer: PRIVATE HEALTH INSURANCE | Admitting: Family Medicine

## 2021-11-25 ENCOUNTER — Encounter: Payer: Self-pay | Admitting: Family Medicine

## 2021-11-25 VITALS — BP 132/76 | HR 95 | Resp 16 | Ht 61.0 in | Wt 266.0 lb

## 2021-11-25 DIAGNOSIS — E1169 Type 2 diabetes mellitus with other specified complication: Secondary | ICD-10-CM

## 2021-11-25 DIAGNOSIS — Z0000 Encounter for general adult medical examination without abnormal findings: Secondary | ICD-10-CM

## 2021-11-25 DIAGNOSIS — E890 Postprocedural hypothyroidism: Secondary | ICD-10-CM

## 2021-11-25 DIAGNOSIS — E519 Thiamine deficiency, unspecified: Secondary | ICD-10-CM | POA: Diagnosis not present

## 2021-11-25 DIAGNOSIS — E559 Vitamin D deficiency, unspecified: Secondary | ICD-10-CM

## 2021-11-25 DIAGNOSIS — Z79899 Other long term (current) drug therapy: Secondary | ICD-10-CM

## 2021-11-25 DIAGNOSIS — Z1211 Encounter for screening for malignant neoplasm of colon: Secondary | ICD-10-CM | POA: Diagnosis not present

## 2021-11-25 DIAGNOSIS — Z13820 Encounter for screening for osteoporosis: Secondary | ICD-10-CM

## 2021-11-25 DIAGNOSIS — E538 Deficiency of other specified B group vitamins: Secondary | ICD-10-CM

## 2021-11-25 DIAGNOSIS — E2839 Other primary ovarian failure: Secondary | ICD-10-CM

## 2021-11-25 DIAGNOSIS — Z9884 Bariatric surgery status: Secondary | ICD-10-CM

## 2021-11-25 DIAGNOSIS — E785 Hyperlipidemia, unspecified: Secondary | ICD-10-CM

## 2021-11-25 DIAGNOSIS — Z1231 Encounter for screening mammogram for malignant neoplasm of breast: Secondary | ICD-10-CM

## 2021-12-03 LAB — LIPID PANEL
Cholesterol: 201 mg/dL — ABNORMAL HIGH (ref <200)
HDL: 47 mg/dL — ABNORMAL LOW (ref >=50)
LDL Cholesterol (Calc): 118 mg/dL (calc) — ABNORMAL HIGH
Non-HDL Cholesterol (Calc): 154 mg/dL (calc) — ABNORMAL HIGH (ref <130)
Total CHOL/HDL Ratio: 4.3 (calc) (ref <5.0)
Triglycerides: 252 mg/dL — ABNORMAL HIGH (ref <150)

## 2021-12-03 LAB — CBC WITH DIFFERENTIAL/PLATELET
Absolute Monocytes: 350 {cells}/uL (ref 200–950)
Basophils Absolute: 32 {cells}/uL (ref 0–200)
Basophils Relative: 0.7 %
Eosinophils Absolute: 101 {cells}/uL (ref 15–500)
Eosinophils Relative: 2.2 %
HCT: 41.9 % (ref 35.0–45.0)
Hemoglobin: 14.2 g/dL (ref 11.7–15.5)
Lymphs Abs: 2746 {cells}/uL (ref 850–3900)
MCH: 29.6 pg (ref 27.0–33.0)
MCHC: 33.9 g/dL (ref 32.0–36.0)
MCV: 87.3 fL (ref 80.0–100.0)
MPV: 10.5 fL (ref 7.5–12.5)
Monocytes Relative: 7.6 %
Neutro Abs: 1371 {cells}/uL — ABNORMAL LOW (ref 1500–7800)
Neutrophils Relative %: 29.8 %
Platelets: 320 10*3/uL (ref 140–400)
RBC: 4.8 Million/uL (ref 3.80–5.10)
RDW: 12.8 % (ref 11.0–15.0)
Total Lymphocyte: 59.7 %
WBC: 4.6 10*3/uL (ref 3.8–10.8)

## 2021-12-03 LAB — COMPLETE METABOLIC PANEL WITH GFR
AG Ratio: 1.7 (calc) (ref 1.0–2.5)
ALT: 28 U/L (ref 6–29)
AST: 20 U/L (ref 10–35)
Albumin: 4.9 g/dL (ref 3.6–5.1)
Alkaline phosphatase (APISO): 62 U/L (ref 37–153)
BUN: 10 mg/dL (ref 7–25)
CO2: 30 mmol/L (ref 20–32)
Calcium: 10.2 mg/dL (ref 8.6–10.4)
Chloride: 102 mmol/L (ref 98–110)
Creat: 0.76 mg/dL (ref 0.50–1.03)
Globulin: 2.9 g/dL (calc) (ref 1.9–3.7)
Glucose, Bld: 110 mg/dL — ABNORMAL HIGH (ref 65–99)
Potassium: 4.7 mmol/L (ref 3.5–5.3)
Sodium: 141 mmol/L (ref 135–146)
Total Bilirubin: 0.4 mg/dL (ref 0.2–1.2)
Total Protein: 7.8 g/dL (ref 6.1–8.1)
eGFR: 93 mL/min/{1.73_m2} (ref >=60)

## 2021-12-03 LAB — MICROALBUMIN / CREATININE URINE RATIO
Creatinine, Urine: 214 mg/dL (ref 20–275)
Microalb Creat Ratio: 13 mcg/mg creat (ref <30)
Microalb, Ur: 2.7 mg/dL

## 2021-12-03 LAB — VITAMIN B12: Vitamin B-12: 485 pg/mL (ref 200–1100)

## 2021-12-03 LAB — VITAMIN B1: Vitamin B1 (Thiamine): 11 nmol/L (ref 8–30)

## 2021-12-03 LAB — VITAMIN D 25 HYDROXY (VIT D DEFICIENCY, FRACTURES): Vit D, 25-Hydroxy: 36 ng/mL (ref 30–100)

## 2021-12-03 LAB — TSH: TSH: 3.83 mIU/L

## 2022-01-03 IMAGING — MG MM DIGITAL SCREENING BILAT W/ TOMO AND CAD
6 of 10 series · 6 of 30 positions shown · non-contrast
Comparison: Previous exam(s).

CLINICAL DATA: Screening.

EXAM:
DIGITAL SCREENING BILATERAL MAMMOGRAM WITH TOMOSYNTHESIS AND CAD
TECHNIQUE: Bilateral screening digital craniocaudal and mediolateral oblique
mammograms were obtained. Bilateral screening digital breast
tomosynthesis was performed. The images were evaluated with
computer-aided detection.

[R CC synth-2D (1 of 2)]
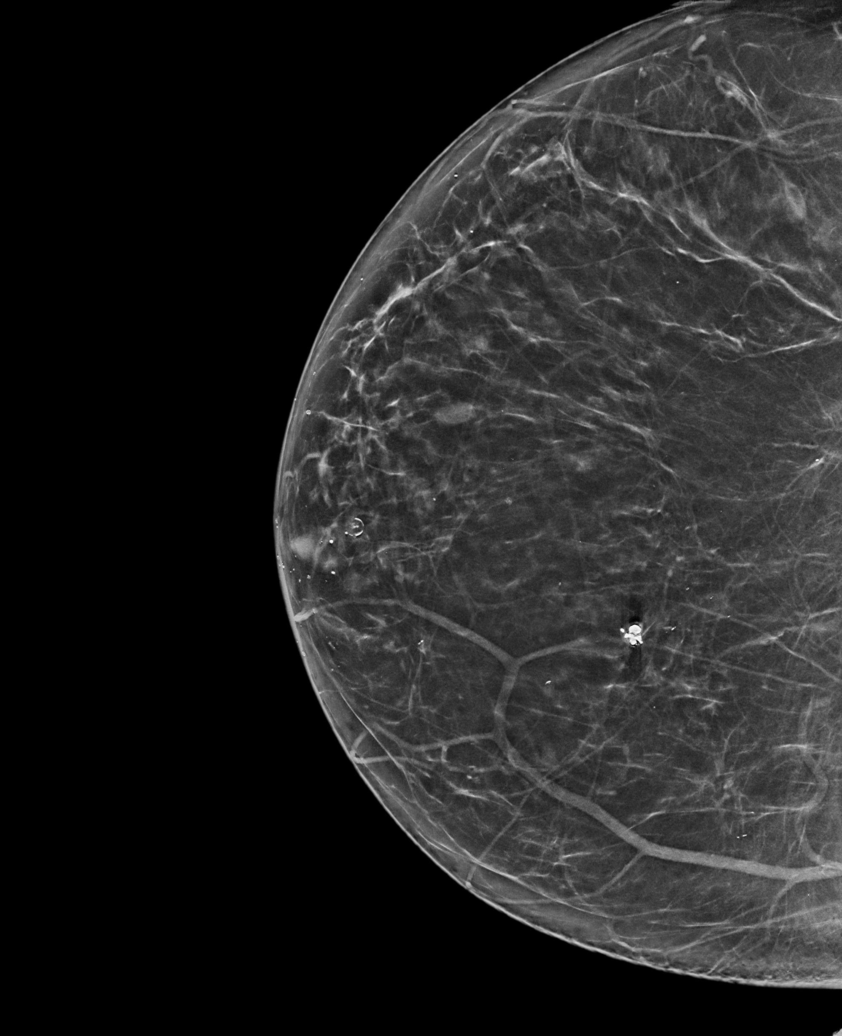

[R CC synth-2D (2 of 2)]
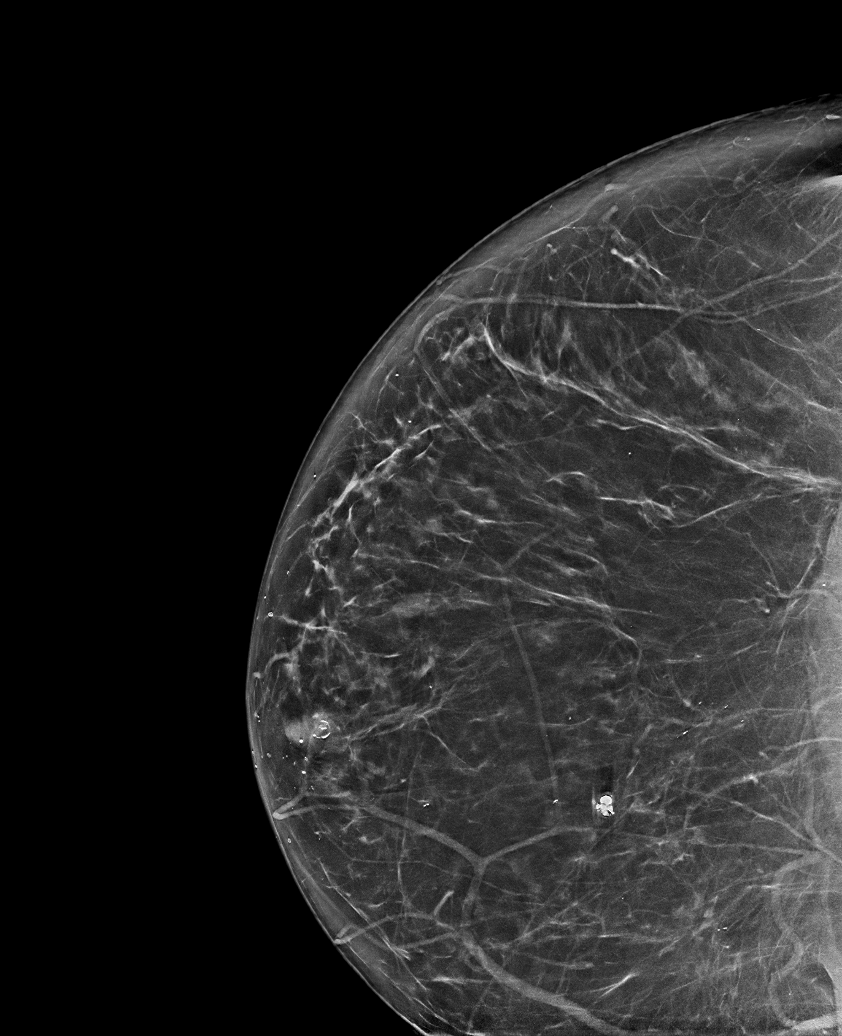

[R MLO synth-2D]
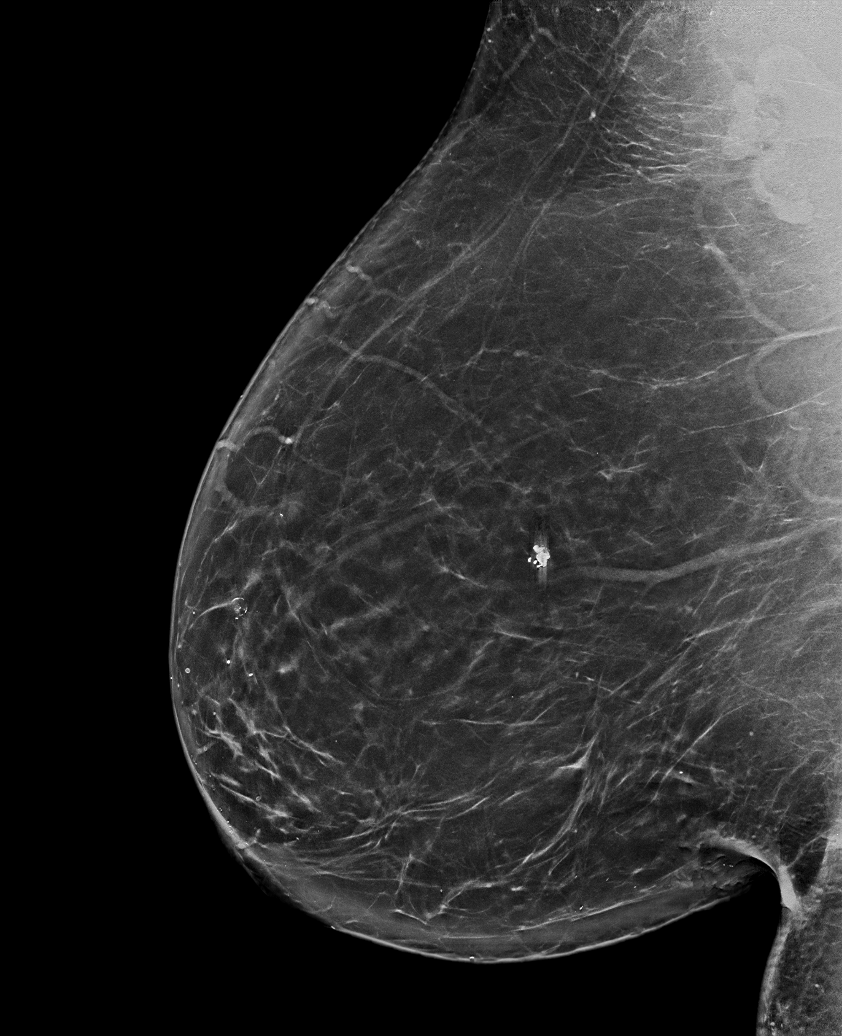

[L CC synth-2D]
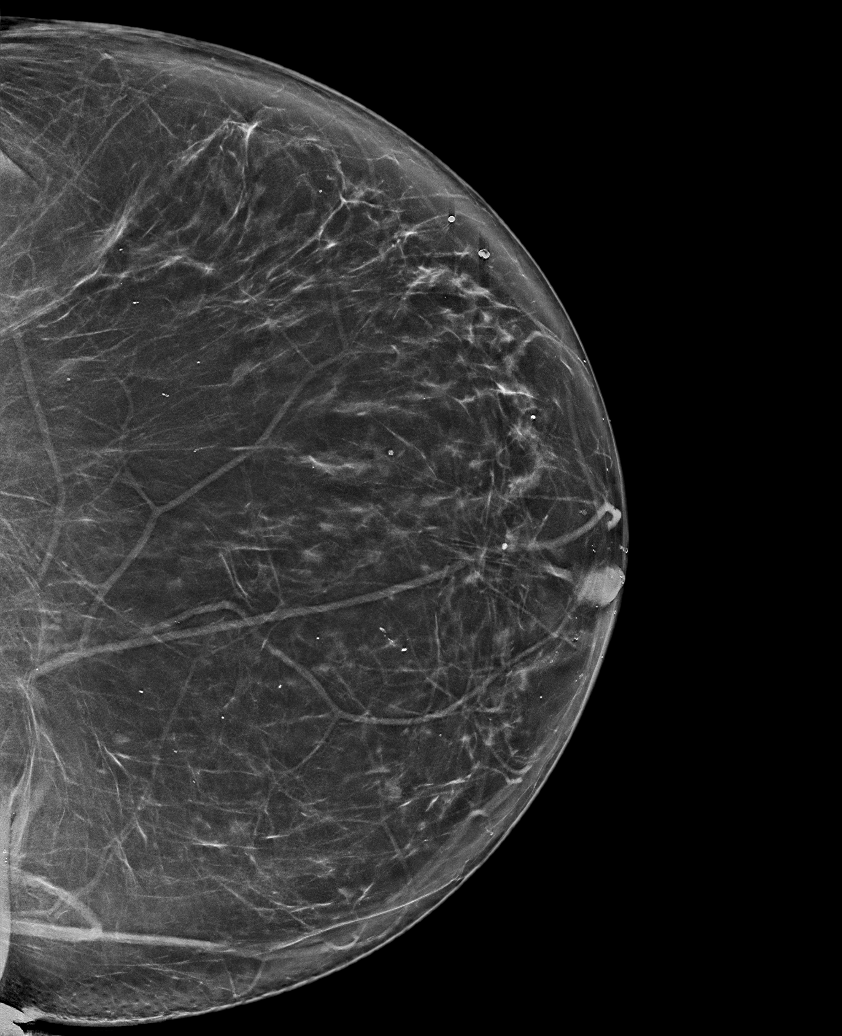

[L MLO synth-2D]
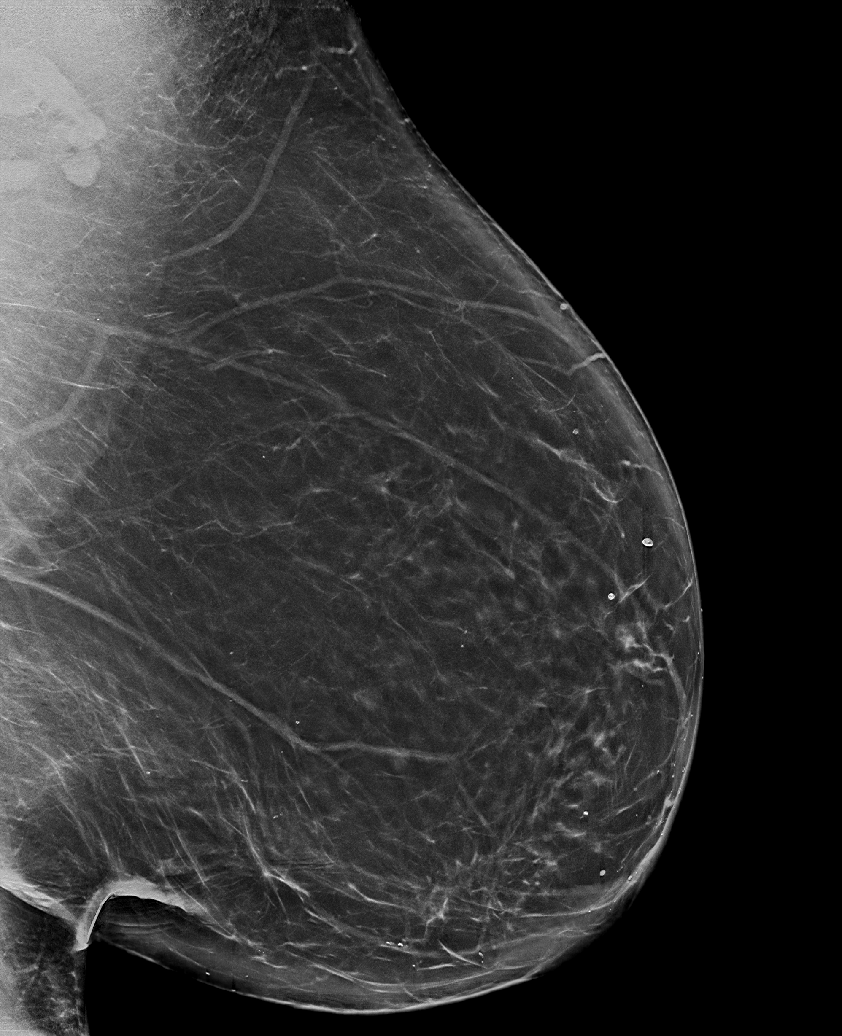

[R MLO tomo · tomo slice 47/94.0]
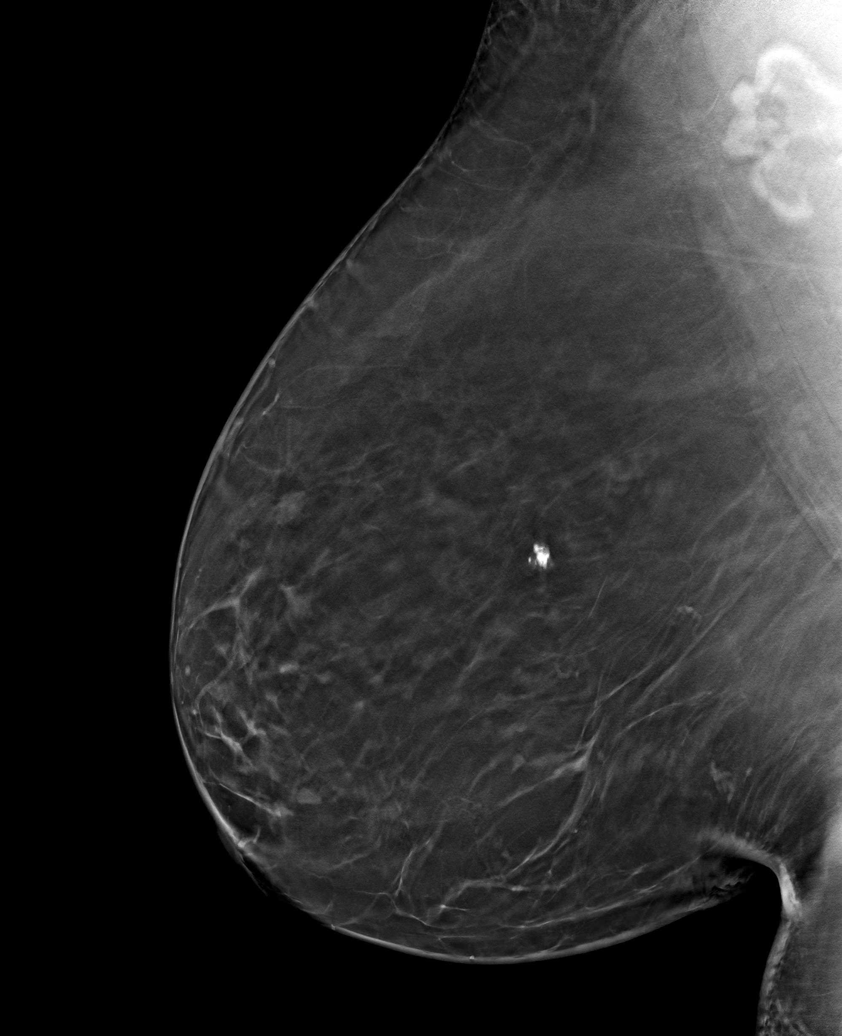

[6 of 30 positions shown; findings below may reference images not displayed]

ACR Breast Density Category b: There are scattered areas of
fibroglandular density.
FINDINGS: There are no findings suspicious for malignancy.
IMPRESSION: No mammographic evidence of malignancy. A result letter of this
screening mammogram will be mailed directly to the patient.

RECOMMENDATION:
Screening mammogram in one year. (Code:51-O-LD2)

BI-RADS CATEGORY  1: Negative.

## 2022-01-08 ENCOUNTER — Telehealth: Payer: Self-pay | Admitting: Family Medicine

## 2022-01-08 DIAGNOSIS — E785 Hyperlipidemia, unspecified: Secondary | ICD-10-CM

## 2022-01-08 DIAGNOSIS — E1169 Type 2 diabetes mellitus with other specified complication: Secondary | ICD-10-CM

## 2022-01-08 NOTE — Telephone Encounter (Signed)
Medication Refill - Medication: rosuvastatin (CRESTOR) 10 MG tablet   Has the patient contacted their pharmacy? Yes.   (Agent: If no, request that the patient contact the pharmacy for the refill. If patient does not wish to contact the pharmacy document the reason why and proceed with request.) (Agent: If yes, when and what did the pharmacy advise?)  Preferred Pharmacy (with phone number or street name):  [id number] - [place], [place] - [address]  [address] [place]  [address]  Phone: [phone] Fax: [phone]   Has the patient been seen for an appointment in the last year OR does the patient have an upcoming appointment? Yes.    Agent: Please be advised that RX refills may take up to 3 business days. We ask that you follow-up with your pharmacy.  

## 2022-01-08 NOTE — Telephone Encounter (Signed)
Rx 09/09/21 #90 [id number]-too soon Requested Prescriptions  Pending Prescriptions Disp Refills  . rosuvastatin (CRESTOR) 10 MG tablet 90 tablet 1    Sig: Take 1 tablet (10 mg total) by mouth daily.     Cardiovascular:  Antilipid - Statins 2 Failed - 01/08/2022 10:29 AM      Failed - Lipid Panel in normal range within the last 12 months    Cholesterol, Total  Date Value Ref Range Status  06/04/2015 160 100 - 199 mg/dL Final   Cholesterol  Date Value Ref Range Status  11/25/2021 201 (H) <200 mg/dL Final   LDL Cholesterol (Calc)  Date Value Ref Range Status  11/25/2021 118 (H) mg/dL (calc) Final    Comment:    Reference range: <100 . Desirable range <100 mg/dL for primary prevention;   <70 mg/dL for patients with CHD or diabetic patients  with > or = 2 CHD risk factors. Marland Kitchen LDL-C is now calculated using the Martin-Hopkins  calculation, which is a validated novel method providing  better accuracy than the Friedewald equation in the  estimation of LDL-C.  Cresenciano Genre et al. Annamaria Helling. 7124;580(99): 2061-2068  (http://education.[facility].com/faq/FAQ164)    HDL  Date Value Ref Range Status  11/25/2021 47 (L) > OR = 50 mg/dL Final  06/04/2015 40 >39 mg/dL Final    Comment:    According to ATP-III Guidelines, HDL-C >59 mg/dL is considered a negative risk factor for CHD.    Triglycerides  Date Value Ref Range Status  11/25/2021 252 (H) <150 mg/dL Final    Comment:    . If a non-fasting specimen was collected, consider repeat triglyceride testing on a fasting specimen if clinically indicated.  Yates Decamp et al. J. of Clin. Lipidol. 8338;2:505-397. Marland Kitchen          Passed - Cr in normal range and within 360 days    Creat  Date Value Ref Range Status  11/25/2021 0.76 0.50 - 1.03 mg/dL Final   Creatinine, Urine  Date Value Ref Range Status  11/25/2021 214 20 - 275 mg/dL Final         Passed - Patient is not pregnant      Passed - Valid encounter within last 12 months    Recent  Outpatient Visits          1 month ago Well adult exam   [facility] [place], Drue Stager, MD   4 months ago Type 2 diabetes mellitus with microalbuminuria, without long-term current use of insulin [facility])   [facility] [place], Drue Stager, MD   9 months ago Type 2 diabetes mellitus with microalbuminuria, without long-term current use of insulin [facility])   [facility] Steele Sizer, MD   11 months ago COVID-70   [facility] Steele Sizer, MD   1 year ago Type 2 diabetes mellitus with microalbuminuria, without long-term current use of insulin [facility])   [facility] Steele Sizer, MD      Future Appointments            In 2 weeks Steele Sizer, MD [facility], [place]   In 10 months Steele Sizer, MD [facility], [facility]

## 2022-01-08 NOTE — Telephone Encounter (Signed)
Pt has 90-day w/1 refill. Too soon to refill.

## 2022-01-19 NOTE — Progress Notes (Unsigned)
Name: Debra Contreras   MRN: [id number]    DOB: 1965-12-31   Date:01/20/2022       Progress Note  Subjective  Chief Complaint  Follow Up  HPI  Thyroid nodule: she was sitting on her desk working on a report and felt a large nodule on the left side back in October 2016, she had a partial thyroidectomy ( removal of left side) in March 2017, and is doing well since. Last TSH was at goal. Denies dysphagia, hair loss or change in bowel movements    DMII with renal manifestation, gingivitis - under the care of dentist , obesity, dyslipidemia: no longer taking Metformin but still takes Lisinopril 10 mg for proteinuria, urine micro was elevated in the past at  50 but last level was back to normal. She is not checking glucose at home.  She denies polyphagia, polydipsia or polyuria Today A1C is up from 6 % to 6.3 % .  She is on Crestor 10 mg daily , LDL still not at goal but she does not want to increase dose at this time. 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 Weight went down to 186 lbs ( lowest after bariatric surgery ). Weight was 251.7 lbs, she gained to 161 lbs earlier this year and now is down to 257 lbs She is under more stress , grieving the loss of her mother last month.   HTN: she is compliant with medication, denies chest pain, palpitation or dizziness. BP is at goal    Dyslipidemia: she is currently Crestor 10 mg , she does not want me to adjust dose at this time, LDL still not at goal, triglycerides is elevated.    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   History of partial thyroidectomy 10/05/2015   Morbid obesity ([place]) 06/04/2015   Breast lump in female 05/17/2015   Acanthosis nigricans 03/09/2015   Allergic rhinitis, seasonal 03/09/2015   Diabetes mellitus with renal manifestation ([place]) 03/09/2015   Dyslipidemia 03/09/2015   Gastro-esophageal reflux disease without esophagitis 03/09/2015   Hiatal hernia 03/09/2015   History of anemia 03/09/2015   H/O: hysterectomy 03/09/2015   Calculus of kidney 03/09/2015   Benign hypertension 03/09/2015   Vitamin D deficiency 03/09/2015   Apnea, sleep 09/12/2014   Microalbuminuria 05/29/2014    Past Surgical History:  Procedure Laterality Date   ABDOMINAL HYSTERECTOMY  03/2012   BREAST BIOPSY Right 12/2005   Normal-Cyst   BREAST BIOPSY Right 12/2005   BREAST REDUCTION SURGERY     BREAST SURGERY Bilateral 1990   Reduction   BREAST SURGERY Bilateral 1990   COLONOSCOPY WITH PROPOFOL N/A 05/07/2017   Procedure: COLONOSCOPY WITH PROPOFOL;  Surgeon: Jonathon Bellows, MD;  Location: [facility] END[facility]COPY;  Service: Gastroenterology;  Laterality: N/A;   COLONOSCOPY WITH PROPOFOL N/A 10/14/2020   Procedure: COLONOSCOPY WITH PROPOFOL;  Surgeon: Lesly Rubenstein, MD;  Location: [facility] ENDOSCOPY;  Service: Endoscopy;  Laterality: N/A;   OOPHORECTOMY Left 03/2011   REDUCTION MAMMAPLASTY Bilateral    REDUCTION MAMMAPLASTY Bilateral    SLEEVE GASTROPLASTY  09/2016   [place]   thyroidectomy Left 10/08/2015    Family History  Problem Relation Age of Onset   Cancer Mother 20       died complications of liver cancer   Hypertension Mother    Cirrhosis Mother    COPD Mother        Heavy Smoker   Hepatitis C Mother    Alcohol abuse Father    Heart disease Father    Heart attack  Father    Diabetes Brother    Obesity Brother    Lung cancer Maternal Grandmother        Snuff   Diabetes Maternal Grandmother    Alcoholism Maternal Grandmother    Alcoholism Maternal Grandfather    Diabetes Maternal Grandfather    Prostate cancer Paternal Grandfather    Breast cancer Cousin     Social History   Tobacco Use   Smoking status: Never   Smokeless tobacco: Never  Substance Use Topics   Alcohol use: Yes    Alcohol/week: 0.0 standard drinks of alcohol    Comment: wine-social drinker     Current Outpatient Medications:    Biotin 5000 MCG TABS, Take 5,000 mg by mouth 3 (three) times a week., Disp: , Rfl:    Cholecalciferol (VITAMIN D) 2000 units CAPS, Take 1 capsule (2,000 Units total) by mouth daily., Disp: 30 capsule, Rfl:    ELDERBERRY PO, Take by mouth., Disp: , Rfl:    Flaxseed, Linseed, (FLAXSEED OIL PO), Take by mouth., Disp: , Rfl:    fluticasone (FLONASE) 50 MCG/ACT nasal spray, Place 2 sprays into both nostrils daily., Disp: 16 g, Rfl: 6   hydrocortisone cream 1 %, Apply 1 application topically as needed for itching., Disp: , Rfl:    lisinopril (ZESTRIL) 10 MG tablet, Take 1 tablet (10 mg total) by mouth daily., Disp: 90 tablet, Rfl: 1   loratadine (CLARITIN) 10 MG tablet, Take by mouth., Disp: , Rfl:    Multiple Vitamin (MULTIVITAMIN) tablet, Take 1 tablet by mouth daily., Disp: , Rfl:    rosuvastatin (CRESTOR) 10 MG tablet, Take 1 tablet (10 mg total) by mouth daily., Disp: 90 tablet, Rfl: 1   Thiamine HCl (VITAMIN B-1 PO), Take 500 mg by mouth daily., Disp: , Rfl:    Turmeric 500 MG CAPS, Take by mouth., Disp: , Rfl:    vitamin B-12 (CYANOCOBALAMIN) 1000 MCG tablet, Take 1,000 mcg by mouth 2 (two) times a week., Disp: , Rfl:    vitamin C (ASCORBIC ACID) 500 MG tablet, Take 500 mg by mouth daily., Disp: , Rfl:   Allergies  Allergen Reactions   Zinc     Nausea, vomiting and diarrhea    Latex Rash    I personally reviewed active problem list,  medication list, allergies, family history, social history, health maintenance with the patient/caregiver today.   ROS  Constitutional: Negative for fever or weight change.  Respiratory: Negative for cough and shortness of breath.   Cardiovascular: Negative for chest pain or palpitations.  Gastrointestinal: Negative for abdominal pain, no bowel changes.  Musculoskeletal: Negative for gait problem or joint swelling.  Skin: Negative for rash.  Neurological: Negative for dizziness or headache.  No other specific complaints in a complete review of systems (except as listed in HPI above).   Objective  Vitals:   01/20/22 1339  BP: 130/76  Pulse: 96  Resp: 16  SpO2: 98%  Weight: 257 lb (116.6 kg)  Height: '5\' 1"'  (1.549 m)    Body mass index is 48.56 kg/m.  Physical Exam  Constitutional: Patient appears well-developed and well-nourished. Obese  No distress.  HEENT: head atraumatic, normocephalic, pupils equal and reactive to light, neck supple Cardiovascular: Normal rate, regular rhythm and normal heart sounds.  No murmur heard. No BLE edema. Pulmonary/Chest: Effort normal and breath sounds normal. No respiratory distress. Abdominal: Soft.  There is no tenderness. Psychiatric: Patient has a normal mood and affect. behavior is normal. Judgment and thought content normal.   Recent Results (from the past 2160 hour(s))  Lipid panel     Status: Abnormal   Collection Time: 11/25/21  9:28 AM  Result Value Ref Range   Cholesterol 201 (H) <200 mg/dL   HDL 47 (L) > OR = 50 mg/dL   Triglycerides 252 (H) <150 mg/dL    Comment: . If a non-fasting specimen was collected, consider repeat triglyceride testing on a fasting specimen if clinically indicated.  Yates Decamp et al. J. of Clin. Lipidol. 5956;3:875-643. Marland Kitchen    LDL Cholesterol (Calc) 118 (H) mg/dL (calc)    Comment: Reference range: <100 . Desirable range <100 mg/dL for primary prevention;   <70 mg/dL for patients with CHD or  diabetic patients  with > or = 2 CHD risk factors. Marland Kitchen LDL-C is now calculated using the Martin-Hopkins  calculation, which is a validated novel method providing  better accuracy than the Friedewald equation in the  estimation of LDL-C.  Cresenciano Genre et al. Annamaria Helling. 3295;188(41): 2061-2068  (http://education.[facility].com/faq/FAQ164)    Total CHOL/HDL Ratio 4.3 <5.0 (calc)   Non-HDL Cholesterol (Calc) 154 (H) <130 mg/dL (calc)    Comment: For patients with diabetes plus 1 major ASCVD risk  factor, treating to a non-HDL-C goal of <100 mg/dL  (LDL-C of <70 mg/dL) is considered a therapeutic  option.   Microalbumin / creatinine urine ratio     Status: None   Collection Time: 11/25/21  9:28 AM  Result Value Ref Range   Creatinine, Urine 214 20 - 275 mg/dL   Microalb, Ur 2.7 mg/dL    Comment: Reference Range Not established    Microalb Creat Ratio 13 <30 mcg/mg creat    Comment: . The ADA defines abnormalities in albumin excretion as follows: Marland Kitchen Albuminuria Category        Result (mcg/mg creatinine) . Normal to Mildly increased   <30 Moderately increased         30-299  Severely increased           > OR = 300 . The ADA recommends that at least two of three specimens collected within a 3-6 month period be abnormal before considering a patient to be within a diagnostic category.   CBC with Differential/Platelet     Status: Abnormal   Collection Time: 11/25/21  9:28 AM  Result Value Ref Range   WBC 4.6 3.8 - 10.8 Thousand/uL   RBC 4.80 3.80 - 5.10 Million/uL   Hemoglobin 14.2 11.7 - 15.5 g/dL   HCT 41.9 35.0 - 45.0 %   MCV 87.3 80.0 - 100.0 fL   MCH 29.6 27.0 - 33.0 pg   MCHC 33.9 32.0 - 36.0 g/dL  RDW 12.8 11.0 - 15.0 %   Platelets 320 140 - 400 Thousand/uL   MPV 10.5 7.5 - 12.5 fL   Neutro Abs 1,371 (L) 1,500 - 7,800 cells/uL   Lymphs Abs 2,746 850 - 3,900 cells/uL   Absolute Monocytes 350 200 - 950 cells/uL   Eosinophils Absolute 101 15 - 500 cells/uL   Basophils  Absolute 32 0 - 200 cells/uL   Neutrophils Relative % 29.8 %   Total Lymphocyte 59.7 %   Monocytes Relative 7.6 %   Eosinophils Relative 2.2 %   Basophils Relative 0.7 %  COMPLETE METABOLIC PANEL WITH GFR     Status: Abnormal   Collection Time: 11/25/21  9:28 AM  Result Value Ref Range   Glucose, Bld 110 (H) 65 - 99 mg/dL    Comment: .            Fasting reference interval . For someone without known diabetes, a glucose value between 100 and 125 mg/dL is consistent with prediabetes and should be confirmed with a follow-up test. .    BUN 10 7 - 25 mg/dL   Creat 0.76 0.50 - 1.03 mg/dL   eGFR 93 > OR = 60 mL/min/1.42m    Comment: The eGFR is based on the CKD-EPI 2021 equation. To calculate  the new eGFR from a previous Creatinine or Cystatin C result, go to https://www.kidney.org/professionals/ kdoqi/gfr%5Fcalculator    BUN/Creatinine Ratio NOT APPLICABLE 6 - 22 (calc)   Sodium 141 135 - 146 mmol/L   Potassium 4.7 3.5 - 5.3 mmol/L   Chloride 102 98 - 110 mmol/L   CO2 30 20 - 32 mmol/L   Calcium 10.2 8.6 - 10.4 mg/dL   Total Protein 7.8 6.1 - 8.1 g/dL   Albumin 4.9 3.6 - 5.1 g/dL   Globulin 2.9 1.9 - 3.7 g/dL (calc)   AG Ratio 1.7 1.0 - 2.5 (calc)   Total Bilirubin 0.4 0.2 - 1.2 mg/dL   Alkaline phosphatase (APISO) 62 37 - 153 U/L   AST 20 10 - 35 U/L   ALT 28 6 - 29 U/L  TSH     Status: None   Collection Time: 11/25/21  9:28 AM  Result Value Ref Range   TSH 3.83 mIU/L    Comment:           Reference Range .           > or = 20 Years  0.40-4.50 .                Pregnancy Ranges           First trimester    0.26-2.66           Second trimester   0.55-2.73           Third trimester    0.43-2.91   Vitamin B12     Status: None   Collection Time: 11/25/21  9:28 AM  Result Value Ref Range   Vitamin B-12 485 200 - 1,100 pg/mL  VITAMIN D 25 Hydroxy (Vit-D Deficiency, Fractures)     Status: None   Collection Time: 11/25/21  9:28 AM  Result Value Ref Range   Vit D,  25-Hydroxy 36 30 - 100 ng/mL    Comment: Vitamin D Status         25-OH Vitamin D: . Deficiency:                    <20 ng/mL Insufficiency:  20 - 29 ng/mL Optimal:                 > or = 30 ng/mL . For 25-OH Vitamin D testing on patients on  D2-supplementation and patients for whom quantitation  of D2 and D3 fractions is required, the [facility](TM) 25-OH VIT D, (D2,D3), LC/MS/MS is recommended: order  code [id number] (patients >42yr). See Note 1 . Note 1 . For additional information, please refer to  http://education.[facility].com/faq/FAQ199  (This link is being provided for informational/ educational purposes only.)   Vitamin B1     Status: None   Collection Time: 11/25/21  9:28 AM  Result Value Ref Range   Vitamin B1 (Thiamine) 11 8 - 30 nmol/L    Comment: .Marland KitchenVitamin supplementation within 24 hours prior to blood draw may affect the accuracy of the results. . This test was developed and its analytical performance characteristics have been determined by [facility] [place] It has not been cleared or approved by the U.S. Food and Drug Administration. This assay has been validated pursuant to the CLIA regulations and is used for clinical purposes. .Marland Kitchen  POCT HgB A1C     Status: Abnormal   Collection Time: 01/20/22  1:49 PM  Result Value Ref Range   Hemoglobin A1C 6.3 (A) 4.0 - 5.6 %   HbA1c POC (<> result, manual entry)     HbA1c, POC (prediabetic range)     HbA1c, POC (controlled diabetic range)      PHQ2/9:    01/20/2022    1:39 PM 11/25/2021    8:46 AM 09/09/2021    1:38 PM 03/21/2021    9:50 AM 01/30/2021    7:39 AM  Depression screen PHQ 2/9  Decreased Interest 0 0 0 0 0  Down, Depressed, Hopeless 1 0 0 0 0  PHQ - 2 Score 1 0 0 0 0  Altered sleeping 0 0 0    Tired, decreased energy 0 0 0    Change in appetite 0 0 0    Feeling bad or failure about yourself  0 0 0    Trouble concentrating 0 0 0    Moving slowly  or fidgety/restless 0 0 0    Suicidal thoughts 0 0 0    PHQ-9 Score 1 0 0      phq 9 is she is grieving    Fall Risk:    01/20/2022    1:39 PM 11/25/2021    8:46 AM 09/09/2021    1:38 PM 03/21/2021    9:49 AM 01/30/2021    7:39 AM  Fall Risk   Falls in the past year? 0 0 0 0 0  Number falls in past yr: 0 0 0 0 0  Injury with Fall? 0 0 0 0 0  Risk for fall due to : No Fall Risks No Fall Risks No Fall Risks    Follow up Falls prevention discussed Falls prevention discussed Falls prevention discussed Falls evaluation completed       Functional Status Survey: Is the patient deaf or have difficulty hearing?: No Does the patient have difficulty seeing, even when wearing glasses/contacts?: No Does the patient have difficulty concentrating, remembering, or making decisions?: No Does the patient have difficulty walking or climbing stairs?: No Does the patient have difficulty dressing or bathing?: No Does the patient have difficulty doing errands alone such as visiting a doctor's office or shopping?: No    Assessment & Plan  Problem List  Items Addressed This Visit     Gastro-esophageal reflux disease without esophagitis    Controlled       Benign hypertension    BP is at goal       Vitamin D deficiency    Continue supplementation       Apnea, sleep    She states not ready to go back for sleep study, going to take custody of her nephew now       Morbid obesity [facility])    She states she       Other Visit Diagnoses     Dyslipidemia associated with type 2 diabetes mellitus ([place])    -  Primary   Relevant Orders   POCT HgB A1C (Completed)   Colon cancer screening

## 2022-01-20 ENCOUNTER — Encounter: Payer: Self-pay | Admitting: Family Medicine

## 2022-01-20 ENCOUNTER — Ambulatory Visit (INDEPENDENT_AMBULATORY_CARE_PROVIDER_SITE_OTHER): Payer: PRIVATE HEALTH INSURANCE | Admitting: Family Medicine

## 2022-01-20 VITALS — BP 130/76 | HR 96 | Resp 16 | Ht 61.0 in | Wt 257.0 lb

## 2022-01-20 DIAGNOSIS — E1169 Type 2 diabetes mellitus with other specified complication: Secondary | ICD-10-CM

## 2022-01-20 DIAGNOSIS — Z1211 Encounter for screening for malignant neoplasm of colon: Secondary | ICD-10-CM

## 2022-01-20 DIAGNOSIS — G4733 Obstructive sleep apnea (adult) (pediatric): Secondary | ICD-10-CM

## 2022-01-20 DIAGNOSIS — E559 Vitamin D deficiency, unspecified: Secondary | ICD-10-CM | POA: Diagnosis not present

## 2022-01-20 DIAGNOSIS — E785 Hyperlipidemia, unspecified: Secondary | ICD-10-CM | POA: Diagnosis not present

## 2022-01-20 DIAGNOSIS — K219 Gastro-esophageal reflux disease without esophagitis: Secondary | ICD-10-CM

## 2022-01-20 DIAGNOSIS — I10 Essential (primary) hypertension: Secondary | ICD-10-CM

## 2022-01-20 LAB — POCT GLYCOSYLATED HEMOGLOBIN (HGB A1C): Hemoglobin A1C: 6.3 % — AB (ref 4.0–5.6)

## 2022-01-20 NOTE — Assessment & Plan Note (Signed)
BP is at goal. 

## 2022-01-20 NOTE — Assessment & Plan Note (Signed)
Controlled.  

## 2022-01-20 NOTE — Assessment & Plan Note (Signed)
She states not ready to go back for sleep study, going to take custody of her nephew now

## 2022-01-20 NOTE — Assessment & Plan Note (Signed)
Continue supplementation  ?

## 2022-01-20 NOTE — Assessment & Plan Note (Signed)
She states she

## 2022-01-20 NOTE — Assessment & Plan Note (Signed)
A1C is going up, Ozempic not covered by insurance , not currently on medication

## 2022-01-26 ENCOUNTER — Ambulatory Visit: Payer: Self-pay | Admitting: Family Medicine

## 2022-04-20 ENCOUNTER — Ambulatory Visit: Payer: PRIVATE HEALTH INSURANCE | Admitting: Family Medicine

## 2022-05-08 NOTE — Progress Notes (Signed)
Name: Debra Contreras   MRN: [id number]    DOB: 13-May-1966   Date:05/11/2022       Progress Note  Subjective  Chief Complaint  Follow Up  HPI  Thyroid nodule: she was sitting on her desk working on a report and felt a large nodule on the left side back in October 2016, she had a partial thyroidectomy ( removal of left side) in March 2017, and is doing well since. Last TSH was at goal. We will continue checking it yearly   DMII with renal manifestation, gingivitis - under the care of dentist , obesity, dyslipidemia: no longer taking Metformin but still takes Lisinopril 10 mg for proteinuria, urine micro was elevated in the past at  50 but last level was back to norma on her last labs.  She denies polyphagia, polydipsia or polyuria Today A1C is up from 6 % to 6.3 % and today is 6 % .  She is on Crestor 10 mg but has been out of medication for the past 10 days. . 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 Weight went down to 186 lbs ( lowest after bariatric surgery ). Weight was 251.7 lbs, she gained to 161 lbs earlier this year and now is down to 257 lbs . Weight is stable since last visit   HTN: she is compliant with medication, denies chest pain, palpitation or dizziness. BP is at goal , continue lisinopril    Dyslipidemia: she is currently Crestor 10 mg , we will recheck labs next time, compliant with medication    OSA:   Symptoms improved with weight loss, but she has gained it back,  she wakes up feeling tired, she needs to go back for a sleep study , she started a new job in August and we will wait until next visit when she has her insurance card   Colon polyps: she is due for repeat  colonoscopy , reminded her again of importance of having it done   Stress: mother died 12/21/2020 and she is her nephew's guardian, he is 10 years. 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   History of partial thyroidectomy 10/05/2015   Morbid obesity ([place]) 06/04/2015   Breast lump in female 05/17/2015   Acanthosis nigricans 03/09/2015   Allergic rhinitis, seasonal 03/09/2015   Controlled type 2 diabetes mellitus with microalbuminuria ([place]) 03/09/2015   Dyslipidemia 03/09/2015   Gastro-esophageal reflux disease without esophagitis 03/09/2015   Hiatal hernia 03/09/2015   History of anemia 03/09/2015   H/O: hysterectomy 03/09/2015   Calculus of kidney 03/09/2015   Benign hypertension 03/09/2015   Vitamin D deficiency 03/09/2015   Apnea, sleep 09/12/2014   Microalbuminuria 05/29/2014    Past Surgical History:  Procedure Laterality Date   ABDOMINAL HYSTERECTOMY  03/2012   BREAST BIOPSY Right 12-21-05   Normal-Cyst   BREAST BIOPSY Right 12/21/05   BREAST REDUCTION SURGERY     BREAST SURGERY Bilateral 1990   Reduction   BREAST SURGERY Bilateral 1990   COLONOSCOPY WITH PROPOFOL N/A 05/07/2017  Procedure: COLONOSCOPY WITH PROPOFOL;  Surgeon: Jonathon Bellows, MD;  Location: [facility] ENDOSCOPY;  Service: Gastroenterology;  Laterality: N/A;   COLONOSCOPY WITH PROPOFOL N/A 10/14/2020   Procedure: COLONOSCOPY WITH PROPOFOL;  Surgeon: Lesly Rubenstein, MD;  Location: [facility] ENDOSCOPY;  Service: Endoscopy;  Laterality: N/A;   OOPHORECTOMY Left 03/2011   REDUCTION MAMMAPLASTY Bilateral    REDUCTION MAMMAPLASTY Bilateral    SLEEVE GASTROPLASTY  09/2016   [place]   thyroidectomy Left 10/08/2015    Family History  Problem Relation Age of Onset   Cancer Mother 34       died complications of liver cancer   Hypertension Mother    Cirrhosis Mother    COPD Mother        Heavy Smoker   Hepatitis C Mother     Alcohol abuse Father    Heart disease Father    Heart attack Father    Diabetes Brother    Obesity Brother    Lung cancer Maternal Grandmother        Snuff   Diabetes Maternal Grandmother    Alcoholism Maternal Grandmother    Alcoholism Maternal Grandfather    Diabetes Maternal Grandfather    Prostate cancer Paternal Grandfather    Breast cancer Cousin     Social History   Tobacco Use   Smoking status: Never   Smokeless tobacco: Never  Substance Use Topics   Alcohol use: Yes    Alcohol/week: 0.0 standard drinks of alcohol    Comment: wine-social drinker     Current Outpatient Medications:    Biotin 5000 MCG TABS, Take 5,000 mg by mouth 3 (three) times a week., Disp: , Rfl:    Cholecalciferol (VITAMIN D) 2000 units CAPS, Take 1 capsule (2,000 Units total) by mouth daily., Disp: 30 capsule, Rfl:    ELDERBERRY PO, Take by mouth., Disp: , Rfl:    Flaxseed, Linseed, (FLAXSEED OIL PO), Take by mouth., Disp: , Rfl:    fluticasone (FLONASE) 50 MCG/ACT nasal spray, Place 2 sprays into both nostrils daily., Disp: 16 g, Rfl: 6   hydrocortisone cream 1 %, Apply 1 application topically as needed for itching., Disp: , Rfl:    lisinopril (ZESTRIL) 10 MG tablet, Take 1 tablet (10 mg total) by mouth daily., Disp: 90 tablet, Rfl: 1   loratadine (CLARITIN) 10 MG tablet, Take by mouth., Disp: , Rfl:    Multiple Vitamin (MULTIVITAMIN) tablet, Take 1 tablet by mouth daily., Disp: , Rfl:    Thiamine HCl (VITAMIN B-1 PO), Take 500 mg by mouth daily., Disp: , Rfl:    Turmeric 500 MG CAPS, Take by mouth., Disp: , Rfl:    vitamin B-12 (CYANOCOBALAMIN) 1000 MCG tablet, Take 1,000 mcg by mouth 2 (two) times a week., Disp: , Rfl:    vitamin C (ASCORBIC ACID) 500 MG tablet, Take 500 mg by mouth daily., Disp: , Rfl:    rosuvastatin (CRESTOR) 10 MG tablet, Take 1 tablet (10 mg total) by mouth daily., Disp: 90 tablet, Rfl: 1  Allergies  Allergen Reactions   Zinc     Nausea, vomiting and diarrhea     Latex Rash    I personally reviewed active problem list, medication list, allergies, family history, social history, health maintenance with the patient/caregiver today.   ROS  Constitutional: Negative for fever or weight change.  Respiratory: Negative for cough and shortness of breath.   Cardiovascular: Negative for chest pain or palpitations.  Gastrointestinal: Negative for abdominal pain, no bowel changes.  Musculoskeletal: Negative for  gait problem or joint swelling.  Skin: Negative for rash.  Neurological: Negative for dizziness or headache.  No other specific complaints in a complete review of systems (except as listed in HPI above).   Objective  Vitals:   05/11/22 0846  BP: 128/84  Pulse: 90  Resp: 16  SpO2: 96%  Weight: 258 lb (117 kg)  Height: '5\' 1"'$  (1.549 m)    Body mass index is 48.75 kg/m.  Physical Exam  Constitutional: Patient appears well-developed and well-nourished. Obese  No distress.  HEENT: head atraumatic, normocephalic, pupils equal and reactive to light, neck supple Cardiovascular: Normal rate, regular rhythm and normal heart sounds.  No murmur heard. No BLE edema. Pulmonary/Chest: Effort normal and breath sounds normal. No respiratory distress. Abdominal: Soft.  There is no tenderness. Psychiatric: Patient has a normal mood and affect. behavior is normal. Judgment and thought content normal.   Recent Results (from the past 2160 hour(s))  POCT HgB A1C     Status: Abnormal   Collection Time: 05/11/22  8:48 AM  Result Value Ref Range   Hemoglobin A1C 6.0 (A) 4.0 - 5.6 %   HbA1c POC (<> result, manual entry)     HbA1c, POC (prediabetic range)     HbA1c, POC (controlled diabetic range)      Diabetic Foot Exam: Diabetic Foot Exam - Simple   Simple Foot Form Visual Inspection No deformities, no ulcerations, no other skin breakdown bilaterally: Yes Sensation Testing Intact to touch and monofilament testing bilaterally: Yes Pulse  Check Posterior Tibialis and Dorsalis pulse intact bilaterally: Yes Comments Pain during palpation of mid foot on both feet       PHQ2/9:    05/11/2022    8:47 AM 01/20/2022    1:39 PM 11/25/2021    8:46 AM 09/09/2021    1:38 PM 03/21/2021    9:50 AM  Depression screen PHQ 2/9  Decreased Interest 0 0 0 0 0  Down, Depressed, Hopeless 0 1 0 0 0  PHQ - 2 Score 0 1 0 0 0  Altered sleeping 0 0 0 0   Tired, decreased energy 0 0 0 0   Change in appetite 0 0 0 0   Feeling bad or failure about yourself  0 0 0 0   Trouble concentrating 0 0 0 0   Moving slowly or fidgety/restless 0 0 0 0   Suicidal thoughts 0 0 0 0   PHQ-9 Score 0 1 0 0     phq 9 is negative   Fall Risk:    05/11/2022    8:47 AM 01/20/2022    1:39 PM 11/25/2021    8:46 AM 09/09/2021    1:38 PM 03/21/2021    9:49 AM  Fall Risk   Falls in the past year? 0 0 0 0 0  Number falls in past yr: 0 0 0 0 0  Injury with Fall? 0 0 0 0 0  Risk for fall due to : No Fall Risks No Fall Risks No Fall Risks No Fall Risks   Follow up Falls prevention discussed Falls prevention discussed Falls prevention discussed Falls prevention discussed Falls evaluation completed      Functional Status Survey: Is the patient deaf or have difficulty hearing?: No Does the patient have difficulty seeing, even when wearing glasses/contacts?: No Does the patient have difficulty concentrating, remembering, or making decisions?: No Does the patient have difficulty walking or climbing stairs?: No Does the patient have difficulty dressing or bathing?: No  Does the patient have difficulty doing errands alone such as visiting a doctor's office or shopping?: No    Assessment & Plan  1. Dyslipidemia associated with type 2 diabetes mellitus (HCC)  - POCT HgB A1C - HM Diabetes Foot Exam - rosuvastatin (CRESTOR) 10 MG tablet; Take 1 tablet (10 mg total) by mouth daily.  Dispense: 90 tablet; Refill: 1  2. Need for immunization against influenza  - Flu  Vaccine QUAD 6+ mos PF IM (Fluarix Quad PF)  3. Morbid obesity ([place])  Discussed with the patient the risk posed by an increased BMI. Discussed importance of portion control, calorie counting and at least 150 minutes of physical activity weekly. Avoid sweet beverages and drink more water. Eat at least 6 servings of fruit and vegetables daily    4. Microalbuminuria  Recheck next visit   5. Benign hypertension  At goal   6. Dyslipidemia  - rosuvastatin (CRESTOR) 10 MG tablet; Take 1 tablet (10 mg total) by mouth daily.  Dispense: 90 tablet; Refill: 1  7. Vitamin D deficiency  Continue vitamin daily   8. OSA (obstructive sleep apnea)   9. History of partial thyroidectomy  Recheck it yearly   10. History of bariatric surgery

## 2022-05-11 ENCOUNTER — Ambulatory Visit: Payer: Self-pay | Admitting: Family Medicine

## 2022-05-11 ENCOUNTER — Encounter: Payer: Self-pay | Admitting: Family Medicine

## 2022-05-11 VITALS — BP 128/84 | HR 90 | Resp 16 | Ht 61.0 in | Wt 258.0 lb

## 2022-05-11 DIAGNOSIS — Z9884 Bariatric surgery status: Secondary | ICD-10-CM

## 2022-05-11 DIAGNOSIS — E785 Hyperlipidemia, unspecified: Secondary | ICD-10-CM

## 2022-05-11 DIAGNOSIS — E890 Postprocedural hypothyroidism: Secondary | ICD-10-CM

## 2022-05-11 DIAGNOSIS — Z23 Encounter for immunization: Secondary | ICD-10-CM

## 2022-05-11 DIAGNOSIS — G4733 Obstructive sleep apnea (adult) (pediatric): Secondary | ICD-10-CM

## 2022-05-11 DIAGNOSIS — I10 Essential (primary) hypertension: Secondary | ICD-10-CM

## 2022-05-11 DIAGNOSIS — R809 Proteinuria, unspecified: Secondary | ICD-10-CM

## 2022-05-11 DIAGNOSIS — E559 Vitamin D deficiency, unspecified: Secondary | ICD-10-CM

## 2022-05-11 DIAGNOSIS — E1169 Type 2 diabetes mellitus with other specified complication: Secondary | ICD-10-CM

## 2022-05-11 LAB — POCT GLYCOSYLATED HEMOGLOBIN (HGB A1C): Hemoglobin A1C: 6 % — AB (ref 4.0–5.6)

## 2022-05-11 MED ORDER — ROSUVASTATIN CALCIUM 10 MG PO TABS: 10.0000 mg | ORAL_TABLET | Freq: Every day | ORAL | 1 refills | Status: DC

## 2022-08-13 NOTE — Progress Notes (Signed)
Name: Debra Contreras   MRN: [id number]    DOB: 1965-12-06   Date:08/14/2022       Progress Note  Subjective  Chief Complaint  Follow Up  HPI  Thyroid nodule: she was sitting on her desk working on a report and felt a large nodule on the left side back in October 2016, she had a partial thyroidectomy ( removal of left side) in March 2017, and is doing well since. Last TSH was at goal. We will continue checking it yearly, she wants to do it during her CPE   DMII with renal manifestation, gingivitis - under the care of dentist , obesity, dyslipidemia: no longer taking Metformin but still takes Lisinopril 10 mg for proteinuria, urine micro was elevated in the past at  50 She denies polyphagia, polydipsia or polyuria Today A1C is up from 6 % to 6.3 % , last A1C was 6 % and now it is up to 6.7 % - her diet has not been the best lately  .  She is on Crestor 10 mg but has been out of medication for the past 10 days. 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 Weight went down to 186 lbs ( lowest after bariatric surgery ). Weight was 251.7 lbs, she gained to 161 lbs in early 2023  and has now been stable at 258 lbs   Plantar fasciitis: she is now wearing insoles for plantar fascitis and has improved symptoms.   Seborrhea capitis: she using otc medication, she states it was going from the scalp to forehead but face has improved   HTN: she is compliant with medication, denies chest pain, palpitation or dizziness. BP is at goal . No side effects of medication    Dyslipidemia: she is currently Crestor 10 mg , compliant with medication , she wants to hold off to recheck on her CPE   OSA:   Symptoms  improved with weight loss, but she has gained it back,  she wakes up feeling tired, she needs to go back for a sleep study , she started a new job in August but states not sure of the cost and will let me know when ready   Colon polyps: she is due for repeat colonoscopy , reminded her again of importance of having it done . Unchanged   Dysthymia: mother died 20-Dec-2020 and she is her nephew's guardian, he is 10 years. There is a lot of adjustment going on , he is behind in reading and she has arranged for tutoring. She states his mother takes drugs and he keeps trying to contact her. 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   History of partial thyroidectomy 10/05/2015   Morbid obesity ([place]) 06/04/2015   Breast lump in female 05/17/2015   Acanthosis nigricans 03/09/2015   Allergic rhinitis, seasonal 03/09/2015   Controlled type 2 diabetes mellitus with microalbuminuria ([place]) 03/09/2015   Dyslipidemia 03/09/2015   Gastro-esophageal reflux disease without esophagitis 03/09/2015   Hiatal hernia 03/09/2015   History of anemia 03/09/2015   H/O: hysterectomy 03/09/2015  Calculus of kidney 03/09/2015   Benign hypertension 03/09/2015   Vitamin D deficiency 03/09/2015   OSA (obstructive sleep apnea) 09/12/2014   Microalbuminuria 05/29/2014    Past Surgical History:  Procedure Laterality Date   ABDOMINAL HYSTERECTOMY  03/2012   BREAST BIOPSY Right 12/2005   Normal-Cyst   BREAST BIOPSY Right 12/2005   BREAST REDUCTION SURGERY     BREAST SURGERY Bilateral 1990   Reduction   BREAST SURGERY Bilateral 1990   COLONOSCOPY WITH PROPOFOL N/A 05/07/2017   Procedure: COLONOSCOPY WITH PROPOFOL;  Surgeon: Jonathon Bellows, MD;  Location: [facility] ENDOSCOPY;  Service: Gastroenterology;  Laterality: N/A;   COLONOSCOPY WITH PROPOFOL N/A 10/14/2020   Procedure: COLONOSCOPY WITH PROPOFOL;  Surgeon: Lesly Rubenstein, MD;  Location: [facility]  ENDOSCOPY;  Service: Endoscopy;  Laterality: N/A;   OOPHORECTOMY Left 03/2011   REDUCTION MAMMAPLASTY Bilateral    REDUCTION MAMMAPLASTY Bilateral    SLEEVE GASTROPLASTY  09/2016   [place]   thyroidectomy Left 10/08/2015    Family History  Problem Relation Age of Onset   Cancer Mother 63       died complications of liver cancer   Hypertension Mother    Cirrhosis Mother    COPD Mother        Heavy Smoker   Hepatitis C Mother    Alcohol abuse Father    Heart disease Father    Heart attack Father    Diabetes Brother    Obesity Brother    Lung cancer Maternal Grandmother        Snuff   Diabetes Maternal Grandmother    Alcoholism Maternal Grandmother    Alcoholism Maternal Grandfather    Diabetes Maternal Grandfather    Prostate cancer Paternal Grandfather    Breast cancer Cousin     Social History   Tobacco Use   Smoking status: Never   Smokeless tobacco: Never  Substance Use Topics   Alcohol use: Yes    Alcohol/week: 0.0 standard drinks of alcohol    Comment: wine-social drinker     Current Outpatient Medications:    Biotin 5000 MCG TABS, Take 5,000 mg by mouth 3 (three) times a week., Disp: , Rfl:    Cholecalciferol (VITAMIN D) 2000 units CAPS, Take 1 capsule (2,000 Units total) by mouth daily., Disp: 30 capsule, Rfl:    ELDERBERRY PO, Take by mouth., Disp: , Rfl:    Flaxseed, Linseed, (FLAXSEED OIL PO), Take by mouth., Disp: , Rfl:    fluticasone (FLONASE) 50 MCG/ACT nasal spray, Place 2 sprays into both nostrils daily., Disp: 16 g, Rfl: 6   hydrocortisone cream 1 %, Apply 1 application topically as needed for itching., Disp: , Rfl:    lisinopril (ZESTRIL) 10 MG tablet, Take 1 tablet (10 mg total) by mouth daily., Disp: 90 tablet, Rfl: 1   loratadine (CLARITIN) 10 MG tablet, Take by mouth., Disp: , Rfl:    Multiple Vitamin (MULTIVITAMIN) tablet, Take 1 tablet by mouth daily., Disp: , Rfl:    rosuvastatin (CRESTOR) 10 MG tablet, Take 1 tablet (10 mg total) by mouth  daily., Disp: 90 tablet, Rfl: 1   Thiamine HCl (VITAMIN B-1 PO), Take 500 mg by mouth daily., Disp: , Rfl:    Turmeric 500 MG CAPS, Take by mouth., Disp: , Rfl:    vitamin B-12 (CYANOCOBALAMIN) 1000 MCG tablet, Take 1,000 mcg by mouth 2 (two) times a week., Disp: , Rfl:    vitamin C (ASCORBIC ACID) 500 MG tablet, Take 500 mg by mouth daily., Disp: ,  Rfl:   Allergies  Allergen Reactions   Zinc     Nausea, vomiting and diarrhea    Latex Rash    I personally reviewed active problem list, medication list, allergies, family history, social history, health maintenance with the patient/caregiver today.   ROS  Ten systems reviewed and is negative except as mentioned in HPI   Objective  Vitals:   08/14/22 0758  BP: 126/78  Pulse: 85  Resp: 16  SpO2: 97%  Weight: 258 lb (117 kg)  Height: '5\' 1"'$  (1.549 m)    Body mass index is 48.75 kg/m.  Physical Exam  Constitutional: Patient appears well-developed and well-nourished. Obese  No distress.  HEENT: head atraumatic, normocephalic, pupils equal and reactive to light, neck supple Cardiovascular: Normal rate, regular rhythm and normal heart sounds.  No murmur heard. No BLE edema. Pulmonary/Chest: Effort normal and breath sounds normal. No respiratory distress. Abdominal: Soft.  There is no tenderness. Psychiatric: Patient has a normal mood and affect. behavior is normal. Judgment and thought content normal.    PHQ2/9:    08/14/2022    7:58 AM 05/11/2022    8:47 AM 01/20/2022    1:39 PM 11/25/2021    8:46 AM 09/09/2021    1:38 PM  Depression screen PHQ 2/9  Decreased Interest 0 0 0 0 0  Down, Depressed, Hopeless 1 0 1 0 0  PHQ - 2 Score 1 0 1 0 0  Altered sleeping 0 0 0 0 0  Tired, decreased energy 0 0 0 0 0  Change in appetite 0 0 0 0 0  Feeling bad or failure about yourself  0 0 0 0 0  Trouble concentrating 0 0 0 0 0  Moving slowly or fidgety/restless 0 0 0 0 0  Suicidal thoughts 0 0 0 0 0  PHQ-9 Score 1 0 1 0 0    phq 9  is positive   Fall Risk:    08/14/2022    7:58 AM 05/11/2022    8:47 AM 01/20/2022    1:39 PM 11/25/2021    8:46 AM 09/09/2021    1:38 PM  Fall Risk   Falls in the past year? 0 0 0 0 0  Number falls in past yr: 0 0 0 0 0  Injury with Fall? 0 0 0 0 0  Risk for fall due to : No Fall Risks No Fall Risks No Fall Risks No Fall Risks No Fall Risks  Follow up Falls prevention discussed Falls prevention discussed Falls prevention discussed Falls prevention discussed Falls prevention discussed      Functional Status Survey: Is the patient deaf or have difficulty hearing?: No Does the patient have difficulty seeing, even when wearing glasses/contacts?: No Does the patient have difficulty concentrating, remembering, or making decisions?: No Does the patient have difficulty walking or climbing stairs?: No Does the patient have difficulty dressing or bathing?: No Does the patient have difficulty doing errands alone such as visiting a doctor's office or shopping?: No    Assessment & Plan  1. Dyslipidemia associated with type 2 diabetes mellitus (HCC)  - POCT HgB A1C - Semaglutide,0.25 or 0.'5MG'$ /DOS, (OZEMPIC, 0.25 OR 0.5 MG/DOSE,) 2 MG/1.5ML SOPN; Inject 0.25-0.5 mg into the skin once a week.  Dispense: 4.5 mL; Refill: 0  2. Morbid obesity ([place])  Discussed with the patient the risk posed by an increased BMI. Discussed importance of portion control, calorie counting and at least 150 minutes of physical activity weekly. Avoid sweet beverages and  drink more water. Eat at least 6 servings of fruit and vegetables daily    3. 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  Dispense: 180 mL; Refill: 2  4. Dysthymia  - DULoxetine (CYMBALTA) 30 MG capsule; Take 1 capsule (30 mg total) by mouth daily.  Dispense: 30 capsule; Refill: 0 - DULoxetine (CYMBALTA) 60 MG capsule; Take 1 capsule (60 mg total) by mouth daily.  Dispense: 90 capsule;  Refill: 0  5. Benign hypertension  - lisinopril (ZESTRIL) 10 MG tablet; Take 1 tablet (10 mg total) by mouth daily.  Dispense: 90 tablet; Refill: 1  6. Microalbuminuria  - lisinopril (ZESTRIL) 10 MG tablet; Take 1 tablet (10 mg total) by mouth daily.  Dispense: 90 tablet; Refill: 1  7. History of bariatric surgery   8. Vitamin D deficiency   9. OSA (obstructive sleep apnea)   10. Dyslipidemia   11. History of partial thyroidectomy   12. B12 deficiency

## 2022-08-14 ENCOUNTER — Other Ambulatory Visit: Payer: Self-pay | Admitting: Family Medicine

## 2022-08-14 ENCOUNTER — Ambulatory Visit (INDEPENDENT_AMBULATORY_CARE_PROVIDER_SITE_OTHER): Payer: BC Managed Care – PPO | Admitting: Family Medicine

## 2022-08-14 ENCOUNTER — Encounter: Payer: Self-pay | Admitting: Family Medicine

## 2022-08-14 VITALS — BP 126/78 | HR 85 | Resp 16 | Ht 61.0 in | Wt 258.0 lb

## 2022-08-14 DIAGNOSIS — L210 Seborrhea capitis: Secondary | ICD-10-CM | POA: Diagnosis not present

## 2022-08-14 DIAGNOSIS — F341 Dysthymic disorder: Secondary | ICD-10-CM

## 2022-08-14 DIAGNOSIS — E538 Deficiency of other specified B group vitamins: Secondary | ICD-10-CM

## 2022-08-14 DIAGNOSIS — E559 Vitamin D deficiency, unspecified: Secondary | ICD-10-CM

## 2022-08-14 DIAGNOSIS — E785 Hyperlipidemia, unspecified: Secondary | ICD-10-CM

## 2022-08-14 DIAGNOSIS — G4733 Obstructive sleep apnea (adult) (pediatric): Secondary | ICD-10-CM

## 2022-08-14 DIAGNOSIS — E1169 Type 2 diabetes mellitus with other specified complication: Secondary | ICD-10-CM

## 2022-08-14 DIAGNOSIS — E890 Postprocedural hypothyroidism: Secondary | ICD-10-CM

## 2022-08-14 DIAGNOSIS — Z9884 Bariatric surgery status: Secondary | ICD-10-CM

## 2022-08-14 DIAGNOSIS — I10 Essential (primary) hypertension: Secondary | ICD-10-CM

## 2022-08-14 DIAGNOSIS — R809 Proteinuria, unspecified: Secondary | ICD-10-CM

## 2022-08-14 LAB — POCT GLYCOSYLATED HEMOGLOBIN (HGB A1C): Hemoglobin A1C: 6.7 % — AB (ref 4.0–5.6)

## 2022-08-14 MED ORDER — SELENIUM SULFIDE 2.3 % EX SHAM: 1.0000 | MEDICATED_SHAMPOO | CUTANEOUS | 2 refills | Status: AC

## 2022-08-14 MED ORDER — OZEMPIC (0.25 OR 0.5 MG/DOSE) 2 MG/1.5ML ~~LOC~~ SOPN: 0.2500 mg | PEN_INJECTOR | SUBCUTANEOUS | 0 refills | Status: DC

## 2022-08-14 MED ORDER — DULOXETINE HCL 30 MG PO CPEP: 30.0000 mg | ORAL_CAPSULE | Freq: Every day | ORAL | 0 refills | Status: DC

## 2022-08-14 MED ORDER — DULOXETINE HCL 60 MG PO CPEP: 60.0000 mg | ORAL_CAPSULE | Freq: Every day | ORAL | 0 refills | Status: DC

## 2022-08-14 MED ORDER — LISINOPRIL 10 MG PO TABS: 10.0000 mg | ORAL_TABLET | Freq: Every day | ORAL | 1 refills | Status: DC

## 2022-09-09 ENCOUNTER — Other Ambulatory Visit: Payer: Self-pay | Admitting: Family Medicine

## 2022-09-09 DIAGNOSIS — F341 Dysthymic disorder: Secondary | ICD-10-CM

## 2022-11-04 ENCOUNTER — Ambulatory Visit: Payer: BC Managed Care – PPO | Admitting: Nurse Practitioner

## 2022-11-04 ENCOUNTER — Other Ambulatory Visit: Payer: Self-pay

## 2022-11-04 ENCOUNTER — Other Ambulatory Visit: Payer: Self-pay | Admitting: Nurse Practitioner

## 2022-11-04 VITALS — BP 130/80 | HR 100 | Temp 98.8°F | Resp 18 | Ht 61.0 in | Wt 248.7 lb

## 2022-11-04 DIAGNOSIS — J029 Acute pharyngitis, unspecified: Secondary | ICD-10-CM

## 2022-11-04 DIAGNOSIS — J069 Acute upper respiratory infection, unspecified: Secondary | ICD-10-CM

## 2022-11-04 DIAGNOSIS — R051 Acute cough: Secondary | ICD-10-CM

## 2022-11-04 LAB — POCT INFLUENZA A/B
Influenza A, POC: NEGATIVE
Influenza B, POC: NEGATIVE

## 2022-11-04 LAB — POCT RAPID STREP A (OFFICE): Rapid Strep A Screen: NEGATIVE

## 2022-11-04 MED ORDER — HYDROCOD POLI-CHLORPHE POLI ER 10-8 MG/5ML PO SUER
5.0000 mL | Freq: Two times a day (BID) | ORAL | 0 refills | Status: DC | PRN
Start: 2022-11-04 — End: 2022-12-01

## 2022-11-04 MED ORDER — PREDNISONE 10 MG (21) PO TBPK: ORAL_TABLET | ORAL | 0 refills | Status: DC

## 2022-11-04 MED ORDER — BENZONATATE 100 MG PO CAPS
200.0000 mg | ORAL_CAPSULE | Freq: Two times a day (BID) | ORAL | 0 refills | Status: DC | PRN
Start: 2022-11-04 — End: 2022-12-01

## 2022-11-04 NOTE — Progress Notes (Signed)
BP 130/80   Pulse 100   Temp 98.8 F (37.1 C) (Oral)   Resp 18   Ht 5\' 1"  (1.549 m)   Wt 248 lb 11.2 oz (112.8 kg)   SpO2 96%   BMI 46.99 kg/m    Subjective:    Patient ID: Debra Contreras, female    DOB: 1965/08/08, 57 y.o.   MRN: [id number]  HPI: Debra Contreras is a 57 y.o. female  Chief Complaint  Patient presents with   Sore Throat   URI    Cough, congested, fever, headache for 7 days. Exposed to covid, rsv, flu   URI:  patient reports symptoms started Thursday. She says she has been exposed to covid, rsv and flu.  She tested for covid at work which was negative. Her strep was negative, flu negative.  Covid pcr pending, RSV pending. She endorses nasal congestion, sore throat, cough, fever, headache, body aches,  she denies any shortness of breath.  She reports she has taken mucinex, tessalon perls, ibuprofen. Recommend taking zyrtec or Claritin, flonase, mucinex, vitamin d, vitamin c. Push fluids and get rest.  Can gargle with salt water, drink hot tea with honey, use throat lozenges.   Patient concerned she has a sinus infection, discussed likely viral if no improvement by Friday will send in antibiotics.   Relevant past medical, surgical, family and social history reviewed and updated as indicated. Interim medical history since our last visit reviewed. Allergies and medications reviewed and updated.  Review of Systems  Constitutional:positive for fever , negative for  weight change.  HEENT: positive for nasal congestion, sore throat Respiratory: positive for cough and negative for  shortness of breath.   Cardiovascular: Negative for chest pain or palpitations.  Gastrointestinal: Negative for abdominal pain, no bowel changes.  Musculoskeletal: Negative for gait problem or joint swelling.  Skin: Negative for rash.  Neurological: Negative for dizziness, positive for headache.  No other specific complaints in a complete review of systems (except as listed in HPI  above).      Objective:    BP 130/80   Pulse 100   Temp 98.8 F (37.1 C) (Oral)   Resp 18   Ht 5\' 1"  (1.549 m)   Wt 248 lb 11.2 oz (112.8 kg)   SpO2 96%   BMI 46.99 kg/m   Wt Readings from Last 3 Encounters:  11/04/22 248 lb 11.2 oz (112.8 kg)  08/14/22 258 lb (117 kg)  05/11/22 258 lb (117 kg)    Physical Exam  Constitutional: Patient appears well-developed and well-nourished. Obese  No distress.  HEENT: head atraumatic, normocephalic, pupils equal and reactive to light, ears TMs clear, neck supple, throat erythematous, facial tenderness Cardiovascular: Normal rate, regular rhythm and normal heart sounds.  No murmur heard. No BLE edema. Pulmonary/Chest: Effort normal and breath sounds normal. No respiratory distress. Abdominal: Soft.  There is no tenderness. Psychiatric: Patient has a normal mood and affect. behavior is normal. Judgment and thought content normal.   Results for orders placed or performed in visit on 11/04/22  POCT Influenza A/B  Result Value Ref Range   Influenza A, POC Negative Negative   Influenza B, POC Negative Negative  POCT rapid strep A  Result Value Ref Range   Rapid Strep A Screen Negative Negative      Assessment & Plan:   Problem List Items Addressed This Visit   None Visit Diagnoses     Sore throat    -  Primary   start  claritin, flonase, mucinex, gargle with salt water, throat lozenges, start steroid taper, tessalon perls and tussinex.  push fluids and get rest.   Relevant Medications   predniSONE (STERAPRED UNI-PAK 21 TAB) 10 MG (21) TBPK tablet   benzonatate (TESSALON) 100 MG capsule   chlorpheniramine-HYDROcodone (TUSSIONEX) 10-8 MG/5ML   Other Relevant Orders   Novel Coronavirus, NAA ([facility])   POCT Influenza A/B (Completed)   POCT rapid strep A (Completed)   Respiratory virus panel   Acute cough       start claritin, flonase, mucinex, gargle with salt water, throat lozenges, start steroid taper, tessalon perls and tussinex.  push fluids and get rest.   Relevant Medications   predniSONE (STERAPRED UNI-PAK 21 TAB) 10 MG (21) TBPK tablet   benzonatate (TESSALON) 100 MG capsule   chlorpheniramine-HYDROcodone (TUSSIONEX) 10-8 MG/5ML   Other Relevant Orders   Novel Coronavirus, NAA ([facility])   POCT Influenza A/B (Completed)   POCT rapid strep A (Completed)   Respiratory virus panel   Viral upper respiratory tract infection       start claritin, flonase, mucinex, gargle with salt water, throat lozenges, start steroid taper, tessalon perls and tussinex. push fluids and get rest.   Relevant Medications   predniSONE (STERAPRED UNI-PAK 21 TAB) 10 MG (21) TBPK tablet   benzonatate (TESSALON) 100 MG capsule   chlorpheniramine-HYDROcodone (TUSSIONEX) 10-8 MG/5ML   Other Relevant Orders   Respiratory virus panel        Follow up plan: Return if symptoms worsen or fail to improve.

## 2022-11-05 ENCOUNTER — Ambulatory Visit: Payer: BC Managed Care – PPO | Admitting: Nurse Practitioner

## 2022-11-05 LAB — RESPIRATORY SYNCYTIAL VIRUS (RSV) ANTIGEN, DFA

## 2022-11-05 LAB — NOVEL CORONAVIRUS, NAA: SARS-CoV-2, NAA: NOT DETECTED

## 2022-11-06 ENCOUNTER — Other Ambulatory Visit: Payer: Self-pay | Admitting: Nurse Practitioner

## 2022-11-06 ENCOUNTER — Ambulatory Visit: Payer: Self-pay

## 2022-11-06 DIAGNOSIS — J0140 Acute pansinusitis, unspecified: Secondary | ICD-10-CM

## 2022-11-06 MED ORDER — AMOXICILLIN-POT CLAVULANATE 875-125 MG PO TABS
1.0000 | ORAL_TABLET | Freq: Two times a day (BID) | ORAL | 0 refills | Status: AC
Start: 2022-11-06 — End: 2022-11-13

## 2022-11-06 NOTE — Telephone Encounter (Signed)
Summary: Cough Advice   Pt is calling to report that she was seen in office 11/04/22 was prescribed predniSONE (STERAPRED UNI-PAK 21 TAB) 10 MG (21) TBPK tablet [[id number]]. But reports that she still is not feel better. Still has cough. Please advise     Chief Complaint: Pt. Seen 11/04/22 with cough, congestion. Now has yellow, bloody mucus and sinus pain, headache. Reports provider said they would call in antibiotic if no better. Symptoms: Above, does not feel better. Frequency: Last week. Pertinent Negatives: Patient denies fever Disposition: [] ED /[] Urgent Care (no appt availability in office) / [] Appointment(In office/virtual)/ []  [place] [facility]/ [] Home Care/ [] Refused Recommended Disposition /[] [place] [facility]/ [x]  Follow-up with PCP Additional Notes: Please advise pt.  Answer Assessment - Initial Assessment Questions 1. ONSET: "When did the cough begin?"      Last week 2. SEVERITY: "How bad is the cough today?"      Severe 3. SPUTUM: "Describe the color of your sputum" (none, dry cough; clear, white, yellow, green)     Yellow, bloody 4. HEMOPTYSIS: "Are you coughing up any blood?" If so ask: "How much?" (flecks, streaks, tablespoons, etc.)     Yes 5. DIFFICULTY BREATHING: "Are you having difficulty breathing?" If Yes, ask: "How bad is it?" (e.g., mild, moderate, severe)    - MILD: No SOB at rest, mild SOB with walking, speaks normally in sentences, can lie down, no retractions, pulse < 100.    - MODERATE: SOB at rest, SOB with minimal exertion and prefers to sit, cannot lie down flat, speaks in phrases, mild retractions, audible wheezing, pulse 100-120.    - SEVERE: Very SOB at rest, speaks in single words, struggling to breathe, sitting hunched forward, retractions, pulse > 120      No 6. FEVER: "Do you have a fever?" If Yes, ask: "What is your temperature, how was it measured, and when did it start?"     No 7. CARDIAC HISTORY: "Do you have any history of heart  disease?" (e.g., heart attack, congestive heart failure)      No 8. LUNG HISTORY: "Do you have any history of lung disease?"  (e.g., pulmonary embolus, asthma, emphysema)     No 9. PE RISK FACTORS: "Do you have a history of blood clots?" (or: recent major surgery, recent prolonged travel, bedridden)     No 10. OTHER SYMPTOMS: "Do you have any other symptoms?" (e.g., runny nose, wheezing, chest pain)       Sinus headache 11. PREGNANCY: "Is there any chance you are pregnant?" "When was your last menstrual period?"       No 12. TRAVEL: "Have you traveled out of the country in the last month?" (e.g., travel history, exposures)       No  Protocols used: Cough - Acute Productive-A-AH

## 2022-11-06 NOTE — Telephone Encounter (Signed)
Patient notified

## 2022-11-07 ENCOUNTER — Other Ambulatory Visit: Payer: Self-pay | Admitting: Family Medicine

## 2022-11-07 DIAGNOSIS — E785 Hyperlipidemia, unspecified: Secondary | ICD-10-CM

## 2022-11-07 DIAGNOSIS — E1169 Type 2 diabetes mellitus with other specified complication: Secondary | ICD-10-CM

## 2022-11-23 ENCOUNTER — Other Ambulatory Visit: Payer: Self-pay | Admitting: Family Medicine

## 2022-11-23 DIAGNOSIS — Z1231 Encounter for screening mammogram for malignant neoplasm of breast: Secondary | ICD-10-CM

## 2022-11-26 ENCOUNTER — Other Ambulatory Visit: Payer: Self-pay | Admitting: Family Medicine

## 2022-11-26 DIAGNOSIS — F341 Dysthymic disorder: Secondary | ICD-10-CM

## 2022-11-30 NOTE — Patient Instructions (Signed)
Preventive Care 40-56 Years Old, Female Preventive care refers to lifestyle choices and visits with your health care provider that can promote health and wellness. Preventive care visits are also called wellness exams. What can I expect for my preventive care visit? Counseling Your health care provider may ask you questions about your: Medical history, including: Past medical problems. Family medical history. Pregnancy history. Current health, including: Menstrual cycle. Method of birth control. Emotional well-being. Home life and relationship well-being. Sexual activity and sexual health. Lifestyle, including: Alcohol, nicotine or tobacco, and drug use. Access to firearms. Diet, exercise, and sleep habits. Work and work environment. Sunscreen use. Safety issues such as seatbelt and bike helmet use. Physical exam Your health care provider will check your: Height and weight. These may be used to calculate your BMI (body mass index). BMI is a measurement that tells if you are at a healthy weight. Waist circumference. This measures the distance around your waistline. This measurement also tells if you are at a healthy weight and may help predict your risk of certain diseases, such as type 2 diabetes and high blood pressure. Heart rate and blood pressure. Body temperature. Skin for abnormal spots. What immunizations do I need?  Vaccines are usually given at various ages, according to a schedule. Your health care provider will recommend vaccines for you based on your age, medical history, and lifestyle or other factors, such as travel or where you work. What tests do I need? Screening Your health care provider may recommend screening tests for certain conditions. This may include: Lipid and cholesterol levels. Diabetes screening. This is done by checking your blood sugar (glucose) after you have not eaten for a while (fasting). Pelvic exam and Pap test. Hepatitis B test. Hepatitis C  test. HIV (human immunodeficiency virus) test. STI (sexually transmitted infection) testing, if you are at risk. Lung cancer screening. Colorectal cancer screening. Mammogram. Talk with your health care provider about when you should start having regular mammograms. This may depend on whether you have a family history of breast cancer. BRCA-related cancer screening. This may be done if you have a family history of breast, ovarian, tubal, or peritoneal cancers. Bone density scan. This is done to screen for osteoporosis. Talk with your health care provider about your test results, treatment options, and if necessary, the need for more tests. Follow these instructions at home: Eating and drinking  Eat a diet that includes fresh fruits and vegetables, whole grains, lean protein, and low-fat dairy products. Take vitamin and mineral supplements as recommended by your health care provider. Do not drink alcohol if: Your health care provider tells you not to drink. You are pregnant, may be pregnant, or are planning to become pregnant. If you drink alcohol: Limit how much you have to 0-1 drink a day. Know how much alcohol is in your drink. In the [place] one drink equals one 12 oz bottle of beer (355 mL), one 5 oz glass of wine (148 mL), or one 1 oz glass of hard liquor (44 mL). Lifestyle Brush your teeth every morning and night with fluoride toothpaste. Floss one time each day. Exercise for at least 30 minutes 5 or more days each week. Do not use any products that contain nicotine or tobacco. These products include cigarettes, chewing tobacco, and vaping devices, such as e-cigarettes. If you need help quitting, ask your health care provider. Do not use drugs. If you are sexually active, practice safe sex. Use a condom or other form of protection to   prevent STIs. If you do not wish to become pregnant, use a form of birth control. If you plan to become pregnant, see your health care provider for a  prepregnancy visit. Take aspirin only as told by your health care provider. Make sure that you understand how much to take and what form to take. Work with your health care provider to find out whether it is safe and beneficial for you to take aspirin daily. Find healthy ways to manage stress, such as: Meditation, yoga, or listening to music. Journaling. Talking to a trusted person. Spending time with friends and family. Minimize exposure to UV radiation to reduce your risk of skin cancer. Safety Always wear your seat belt while driving or riding in a vehicle. Do not drive: If you have been drinking alcohol. Do not ride with someone who has been drinking. When you are tired or distracted. While texting. If you have been using any mind-altering substances or drugs. Wear a helmet and other protective equipment during sports activities. If you have firearms in your house, make sure you follow all gun safety procedures. Seek help if you have been physically or sexually abused. What's next? Visit your health care provider once a year for an annual wellness visit. Ask your health care provider how often you should have your eyes and teeth checked. Stay up to date on all vaccines. This information is not intended to replace advice given to you by your health care provider. Make sure you discuss any questions you have with your health care provider. Document Revised: 01/15/2021 Document Reviewed: 01/15/2021 [employer] Patient Education  2023 [employer]  

## 2022-11-30 NOTE — Progress Notes (Unsigned)
Name: Debra Contreras   MRN: [id number]    DOB: March 05, 1966   Date:12/01/2022       Progress Note  Subjective  Chief Complaint  Annual Exam  HPI  Patient presents for annual CPE.  Diet: eating healthier  Exercise: she needs to increase physical activity   Last Eye Exam: up to date - scheduled for eye exam May 20 th  Last Dental Exam: up to date  [employer] Visit from 11/04/2022 in [facility]  AUDIT-C Score 1      Depression: Phq 9 is  negative    12/01/2022    7:38 AM 11/04/2022    1:45 PM 08/14/2022    7:58 AM 05/11/2022    8:47 AM 01/20/2022    1:39 PM  Depression screen PHQ 2/9  Decreased Interest 0 0 0 0 0  Down, Depressed, Hopeless 0 0 1 0 1  PHQ - 2 Score 0 0 1 0 1  Altered sleeping 0  0 0 0  Tired, decreased energy 0  0 0 0  Change in appetite 0  0 0 0  Feeling bad or failure about yourself  0  0 0 0  Trouble concentrating 0  0 0 0  Moving slowly or fidgety/restless 0  0 0 0  Suicidal thoughts 0  0 0 0  PHQ-9 Score 0  1 0 1   Hypertension: BP Readings from Last 3 Encounters:  12/01/22 128/76  11/04/22 130/80  08/14/22 126/78   Obesity: Wt Readings from Last 3 Encounters:  12/01/22 249 lb (112.9 kg)  11/04/22 248 lb 11.2 oz (112.8 kg)  08/14/22 258 lb (117 kg)   BMI Readings from Last 3 Encounters:  12/01/22 47.05 kg/m  11/04/22 46.99 kg/m  08/14/22 48.75 kg/m     Vaccines:    Tdap: up to date Shingrix: up to date Pneumonia: up to date  Flu: up to date COVID-19: up to date   Hep C Screening: 01/10/14 STD testing and prevention (HIV/chl/gon/syphilis): N/A Intimate partner violence: negative screen  Sexual History : not sexually active for a few years -  Menstrual History/LMP/Abnormal Bleeding: s/p hysterectomy  Discussed importance of follow up if any post-menopausal bleeding: yes  Incontinence Symptoms: negative for symptoms   Breast cancer:  - Last Mammogram: Scheduled for 12/10/22 - BRCA gene  screening: family history of colon cancer - maternal uncle and maternal cousin  Osteoporosis Prevention : Discussed high calcium and vitamin D supplementation, weight bearing exercises Bone density: N/A  Cervical cancer screening: N/A  Skin cancer: Discussed monitoring for atypical lesions  Colorectal cancer: 10/21/20   Lung cancer:  Low Dose CT Chest recommended if Age 55-80 years, 20 pack-year currently smoking OR have quit w/in 15years. Patient does not qualify for screen   ECG: 09/20/12  Advanced Care Planning: A voluntary discussion about advance care planning including the explanation and discussion of advance directives.  Discussed health care proxy and Living will, and the patient was able to identify a health care proxy as sister .  Patient does not have a living will and power of attorney of health care   Lipids: Lab Results  Component Value Date   CHOL 201 (H) 11/25/2021   CHOL 208 (H) 10/30/2020   CHOL 199 01/26/2020   Lab Results  Component Value Date   HDL 47 (L) 11/25/2021   HDL 47 (L) 10/30/2020   HDL 51 01/26/2020   Lab Results  Component Value Date   LDLCALC  118 (H) 11/25/2021   LDLCALC 118 (H) 10/30/2020   LDLCALC 116 (H) 01/26/2020   Lab Results  Component Value Date   TRIG 252 (H) 11/25/2021   TRIG 302 (H) 10/30/2020   TRIG 200 (H) 01/26/2020   Lab Results  Component Value Date   CHOLHDL 4.3 11/25/2021   CHOLHDL 4.4 10/30/2020   CHOLHDL 3.9 01/26/2020   No results found for: "LDLDIRECT"  Glucose: Glucose, Bld  Date Value Ref Range Status  11/25/2021 110 (H) 65 - 99 mg/dL Final    Comment:    .            Fasting reference interval . For someone without known diabetes, a glucose value between 100 and 125 mg/dL is consistent with prediabetes and should be confirmed with a follow-up test. .   01/30/2021 105 (H) 70 - 99 mg/dL Final    Comment:    Glucose reference range applies only to samples taken after fasting for at least 8 hours.   10/30/2020 119 (H) 65 - 99 mg/dL Final    Comment:    .            Fasting reference interval . For someone without known diabetes, a glucose value between 100 and 125 mg/dL is consistent with prediabetes and should be confirmed with a follow-up test. .     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   History of partial thyroidectomy 10/05/2015   Morbid obesity (HCC) 06/04/2015   Breast lump in female 05/17/2015   Acanthosis nigricans 03/09/2015   Allergic rhinitis, seasonal 03/09/2015   Controlled type 2 diabetes mellitus with microalbuminuria (HCC) 03/09/2015   Dyslipidemia 03/09/2015   Gastro-esophageal reflux disease without esophagitis 03/09/2015   Hiatal hernia 03/09/2015   History of anemia 03/09/2015   H/O: hysterectomy 03/09/2015   Calculus of kidney 03/09/2015   Benign hypertension 03/09/2015   Vitamin D deficiency 03/09/2015   OSA (obstructive sleep apnea) 09/12/2014   Microalbuminuria 05/29/2014    Past Surgical History:  Procedure Laterality Date   ABDOMINAL HYSTERECTOMY  03/2012   BREAST BIOPSY Right 12/2005   Normal-Cyst   BREAST BIOPSY Right 12/2005   BREAST REDUCTION SURGERY     BREAST SURGERY Bilateral 1990   Reduction   BREAST SURGERY Bilateral 1990   COLONOSCOPY WITH PROPOFOL N/A 05/07/2017   Procedure: COLONOSCOPY WITH PROPOFOL;  Surgeon: Wyline Mood, MD;  Location: [facility]COPY;  Service: Gastroenterology;  Laterality: N/A;   COLONOSCOPY WITH PROPOFOL N/A 10/14/2020   Procedure: COLONOSCOPY WITH PROPOFOL;  Surgeon: Regis Bill, MD;  Location: [facility] ENDOSCOPY;  Service: Endoscopy;  Laterality: N/A;   OOPHORECTOMY Left 03/2011   REDUCTION MAMMAPLASTY Bilateral    REDUCTION MAMMAPLASTY Bilateral    SLEEVE GASTROPLASTY  09/2016   [place]   thyroidectomy Left 10/08/2015    Family History  Problem Relation Age of Onset   Cancer Mother 13       died complications of liver cancer    Hypertension Mother    Cirrhosis Mother    COPD Mother        Heavy Smoker   Hepatitis C Mother    Alcohol abuse Father    Heart disease Father    Heart attack Father    Diabetes Brother    Obesity Brother    Lung cancer Maternal Grandmother        Snuff   Diabetes Maternal Grandmother    Alcoholism Maternal Grandmother  Alcoholism Maternal Grandfather    Diabetes Maternal Grandfather    Prostate cancer Paternal Grandfather    Cancer Maternal Uncle        colon cancer   Cancer Cousin 21       colon cancer   Breast cancer Cousin     Social History   Socioeconomic History   Marital status: Divorced    Spouse name: Not on file   Number of children: 0   Years of education: Not on file   Highest education level: Bachelor's degree (e.g., BA, AB, BS)  Occupational History   Occupation: Optometrist   Tobacco Use   Smoking status: Never   Smokeless tobacco: Never  Vaping Use   Vaping Use: Never used  Substance and Sexual Activity   Alcohol use: Yes    Alcohol/week: 0.0 standard drinks of alcohol    Comment: wine-social drinker   Drug use: No   Sexual activity: Yes    Partners: Male  Other Topics Concern   Not on file  Social History Narrative   Her grown up niece moved in with her May 2020    She is a Optometrist.    Social Determinants of Health   Financial Resource Strain: Low Risk  (11/04/2022)   Overall Financial Resource Strain (CARDIA)    Difficulty of Paying Living Expenses: Not very hard  Food Insecurity: No Food Insecurity (11/04/2022)   Hunger Vital Sign    Worried About Running Out of Food in the Last Year: Never true    Ran Out of Food in the Last Year: Never true  Transportation Needs: No Transportation Needs (11/04/2022)   PRAPARE - Administrator, Civil Service (Medical): No    Lack of Transportation (Non-Medical): No  Physical Activity: Insufficiently Active (12/01/2022)   Exercise Vital Sign    Days of Exercise per Week: 5 days     Minutes of Exercise per Session: 10 min  Stress: No Stress Concern Present (11/04/2022)   [employer] of Occupational Health - Occupational Stress Questionnaire    Feeling of Stress : Only a little  Social Connections: Moderately Integrated (11/04/2022)   Social Connection and Isolation Panel [NHANES]    Frequency of Communication with Friends and Family: More than three times a week    Frequency of Social Gatherings with Friends and Family: Twice a week    Attends Religious Services: More than 4 times per year    Active Member of [employer] or Organizations: Yes    Attends Engineer, structural: More than 4 times per year    Marital Status: Divorced  Intimate Partner Violence: Not At Risk (11/25/2021)   Humiliation, Afraid, Rape, and Kick questionnaire    Fear of Current or Ex-Partner: No    Emotionally Abused: No    Physically Abused: No    Sexually Abused: No     Current Outpatient Medications:    Biotin 5000 MCG TABS, Take 5,000 mg by mouth 3 (three) times a week., Disp: , Rfl:    Cholecalciferol (VITAMIN D) 2000 units CAPS, Take 1 capsule (2,000 Units total) by mouth daily., Disp: 30 capsule, Rfl:    DULoxetine (CYMBALTA) 60 MG capsule, Take 1 capsule by mouth once daily, Disp: 90 capsule, Rfl: 0   ELDERBERRY PO, Take by mouth., Disp: , Rfl:    Flaxseed, Linseed, (FLAXSEED OIL PO), Take by mouth., Disp: , Rfl:    fluticasone (FLONASE) 50 MCG/ACT nasal spray, Place 2 sprays into both nostrils  daily., Disp: 16 g, Rfl: 6   hydrocortisone cream 1 %, Apply 1 application topically as needed for itching., Disp: , Rfl:    lisinopril (ZESTRIL) 10 MG tablet, Take 1 tablet (10 mg total) by mouth daily., Disp: 90 tablet, Rfl: 1   loratadine (CLARITIN) 10 MG tablet, Take by mouth., Disp: , Rfl:    Multiple Vitamin (MULTIVITAMIN) tablet, Take 1 tablet by mouth daily., Disp: , Rfl:    rosuvastatin (CRESTOR) 10 MG tablet, Take 1 tablet by mouth once daily, Disp: 90 tablet, Rfl: 0    Selenium Sulfide 2.3 % SHAM, Apply 1 each topically 2 (two) times a week., Disp: 180 mL, Rfl: 2   Thiamine HCl (VITAMIN B-1 PO), Take 500 mg by mouth daily., Disp: , Rfl:    Turmeric 500 MG CAPS, Take by mouth., Disp: , Rfl:    vitamin B-12 (CYANOCOBALAMIN) 1000 MCG tablet, Take 1,000 mcg by mouth 2 (two) times a week., Disp: , Rfl:    vitamin C (ASCORBIC ACID) 500 MG tablet, Take 500 mg by mouth daily., Disp: , Rfl:   Allergies  Allergen Reactions   Zinc     Nausea, vomiting and diarrhea    Latex Rash     ROS  Constitutional: Negative for fever or weight change.  Respiratory: Negative for cough and shortness of breath.   Cardiovascular: Negative for chest pain or palpitations.  Gastrointestinal: Negative for abdominal pain, no bowel changes.  Musculoskeletal: Negative for gait problem or joint swelling.  Skin: Negative for rash.  Neurological: Negative for dizziness or headache.  No other specific complaints in a complete review of systems (except as listed in HPI above).   Objective  Vitals:   12/01/22 0739  BP: 128/76  Pulse: 94  Resp: 16  SpO2: 96%  Weight: 249 lb (112.9 kg)  Height: 5\' 1"  (1.549 m)    Body mass index is 47.05 kg/m.  Physical Exam  Constitutional: Patient appears well-developed and well-nourished. No distress.  HENT: Head: Normocephalic and atraumatic. Ears: B TMs ok, no erythema or effusion; Nose: Nose normal. Mouth/Throat: Oropharynx is clear and moist. No oropharyngeal exudate.  Eyes: Conjunctivae and EOM are normal. Pupils are equal, round, and reactive to light. No scleral icterus.  Neck: Normal range of motion. Neck supple. No JVD present. No thyromegaly present.  Cardiovascular: Normal rate, regular rhythm and normal heart sounds.  No murmur heard. No BLE edema. Pulmonary/Chest: Effort normal and breath sounds normal. No respiratory distress. Abdominal: Soft. Bowel sounds are normal, no distension. There is no tenderness. no masses Breast:  no lumps or masses, no nipple discharge or rashes FEMALE GENITALIA:  External genitalia normal External urethra normal Vaginal vault normal without discharge or lesions Cervix normal without discharge or lesions Bimanual exam normal without masses RECTAL: not done  Musculoskeletal: Normal range of motion, no joint effusions. No gross deformities Neurological: he is alert and oriented to person, place, and time. No cranial nerve deficit. Coordination, balance, strength, speech and gait are normal.  Skin: Skin is warm and dry. No rash noted. No erythema.  Psychiatric: Patient has a normal mood and affect. behavior is normal. Judgment and thought content normal.   Recent Results (from the past 2160 hour(s))  Novel Coronavirus, NAA ([facility])     Status: None   Collection Time: 11/04/22 12:00 AM   Specimen: Nasopharyngeal(NP) swabs in vial transport medium   Nasopharynge  Previous  Result Value Ref Range   SARS-CoV-2, NAA Not Detected Not Detected  Comment: This nucleic acid amplification test was developed and its performance characteristics determined by [employer]. Nucleic acid amplification tests include RT-PCR and TMA. This test has not been FDA cleared or approved. This test has been authorized by FDA under an Emergency Use Authorization (EUA). This test is only authorized for the duration of time the declaration that circumstances exist justifying the authorization of the emergency use of in vitro diagnostic tests for detection of SARS-CoV-2 virus and/or diagnosis of COVID-19 infection under section 564(b)(1) of the Act, 21 U.S.C. 161WRU-0(A) (1), unless the authorization is terminated or revoked sooner. When diagnostic testing is negative, the possibility of a false negative result should be considered in the context of a patient's recent exposures and the presence of clinical signs and symptoms consistent with COVID-19. An individual without symptoms of COVID-19 and  who is not shedding SARS-CoV-2 virus wo uld expect to have a negative (not detected) result in this assay.   POCT rapid strep A     Status: None   Collection Time: 11/04/22  1:49 PM  Result Value Ref Range   Rapid Strep A Screen Negative Negative  POCT Influenza A/B     Status: None   Collection Time: 11/04/22  1:53 PM  Result Value Ref Range   Influenza A, POC Negative Negative   Influenza B, POC Negative Negative  Respiratory Syncytial Virus (RSV) Antigen, DFA     Status: None   Collection Time: 11/04/22  4:02 PM  Result Value Ref Range   MICRO NUMBER: CANCELED     Comment: Result canceled by the ancillary.   SPECIMEN QUALITY: CANCELED     Comment: Result canceled by the ancillary.   SOURCE: CANCELED     Comment: Result canceled by the ancillary.   STATUS: CANCELED     Comment: Result canceled by the ancillary.   RESULT: CANCELED     Comment: Result canceled by the ancillary.     Fall Risk:    12/01/2022    7:38 AM 11/04/2022    1:45 PM 08/14/2022    7:58 AM 05/11/2022    8:47 AM 01/20/2022    1:39 PM  Fall Risk   Falls in the past year? 1 1 0 0 0  Number falls in past yr: 0 1 0 0 0  Injury with Fall? 0 0 0 0 0  Risk for fall due to : No Fall Risks  No Fall Risks No Fall Risks No Fall Risks  Follow up Falls prevention discussed  Falls prevention discussed Falls prevention discussed Falls prevention discussed     Functional Status Survey: Is the patient deaf or have difficulty hearing?: No Does the patient have difficulty seeing, even when wearing glasses/contacts?: No Does the patient have difficulty concentrating, remembering, or making decisions?: No Does the patient have difficulty walking or climbing stairs?: No Does the patient have difficulty dressing or bathing?: No Does the patient have difficulty doing errands alone such as visiting a doctor's office or shopping?: No   Assessment & Plan  1. Well adult exam  - CBC with Differential/Platelet - COMPLETE  METABOLIC PANEL WITH GFR - Lipid panel - Microalbumin / creatinine urine ratio - Hemoglobin A1c - B12 and Folate Panel - VITAMIN D 25 Hydroxy (Vit-D Deficiency, Fractures) - Vitamin B1 - TSH  2. Dyslipidemia associated with type 2 diabetes mellitus (HCC)  - Lipid panel - Microalbumin / creatinine urine ratio - Hemoglobin A1c  3. Morbid obesity (HCC)  Discussed with the patient the  risk posed by an increased BMI. Discussed importance of portion control, calorie counting and at least 150 minutes of physical activity weekly. Avoid sweet beverages and drink more water. Eat at least 6 servings of fruit and vegetables daily    4. Dyslipidemia  - Lipid panel  5. Vitamin D deficiency  - VITAMIN D 25 Hydroxy (Vit-D Deficiency, Fractures)  6. History of bariatric surgery   7. B12 deficiency  - CBC with Differential/Platelet - B12 and Folate Panel  8. Vitamin B1 deficiency  - Vitamin B1  9. Long-term use of high-risk medication  - CBC with Differential/Platelet - COMPLETE METABOLIC PANEL WITH GFR  10. History of partial thyroidectomy  - TSH    -USPSTF grade A and B recommendations reviewed with patient; age-appropriate recommendations, preventive care, screening tests, etc discussed and encouraged; healthy living encouraged; see AVS for patient education given to patient -Discussed importance of 150 minutes of physical activity weekly, eat two servings of fish weekly, eat one serving of tree nuts ( cashews, pistachios, pecans, almonds.Marland Kitchen) every other day, eat 6 servings of fruit/vegetables daily and drink plenty of water and avoid sweet beverages.   -Reviewed Health Maintenance: Yes.

## 2022-12-01 ENCOUNTER — Encounter: Payer: Self-pay | Admitting: Family Medicine

## 2022-12-01 ENCOUNTER — Other Ambulatory Visit: Payer: Self-pay | Admitting: Family Medicine

## 2022-12-01 ENCOUNTER — Telehealth: Payer: Self-pay | Admitting: Family Medicine

## 2022-12-01 ENCOUNTER — Ambulatory Visit (INDEPENDENT_AMBULATORY_CARE_PROVIDER_SITE_OTHER): Payer: BC Managed Care – PPO | Admitting: Family Medicine

## 2022-12-01 VITALS — BP 128/76 | HR 94 | Resp 16 | Ht 61.0 in | Wt 249.0 lb

## 2022-12-01 DIAGNOSIS — E559 Vitamin D deficiency, unspecified: Secondary | ICD-10-CM

## 2022-12-01 DIAGNOSIS — E1169 Type 2 diabetes mellitus with other specified complication: Secondary | ICD-10-CM

## 2022-12-01 DIAGNOSIS — E785 Hyperlipidemia, unspecified: Secondary | ICD-10-CM

## 2022-12-01 DIAGNOSIS — Z1211 Encounter for screening for malignant neoplasm of colon: Secondary | ICD-10-CM

## 2022-12-01 DIAGNOSIS — Z79899 Other long term (current) drug therapy: Secondary | ICD-10-CM | POA: Diagnosis not present

## 2022-12-01 DIAGNOSIS — Z9884 Bariatric surgery status: Secondary | ICD-10-CM

## 2022-12-01 DIAGNOSIS — E538 Deficiency of other specified B group vitamins: Secondary | ICD-10-CM | POA: Diagnosis not present

## 2022-12-01 DIAGNOSIS — E519 Thiamine deficiency, unspecified: Secondary | ICD-10-CM

## 2022-12-01 DIAGNOSIS — Z0000 Encounter for general adult medical examination without abnormal findings: Secondary | ICD-10-CM

## 2022-12-01 DIAGNOSIS — E890 Postprocedural hypothyroidism: Secondary | ICD-10-CM

## 2022-12-01 NOTE — Telephone Encounter (Signed)
Copied from CRM [id number]. Topic: Referral - Request for Referral >> Dec 01, 2022  4:12 PM Patsy Lager T wrote: Has patient seen PCP for this complaint? Yes.    Referral for which specialty: Gastroenterologist Preferred provider/office: [facility] Reason for referral: Colonoscopy

## 2022-12-02 ENCOUNTER — Ambulatory Visit: Payer: BC Managed Care – PPO | Admitting: Family Medicine

## 2022-12-02 LAB — COMPLETE METABOLIC PANEL WITH GFR
AG Ratio: 1.7 (calc) (ref 1.0–2.5)
ALT: 30 U/L — ABNORMAL HIGH (ref 6–29)
BUN: 11 mg/dL (ref 7–25)
CO2: 28 mmol/L (ref 20–32)
Calcium: 9.8 mg/dL (ref 8.6–10.4)

## 2022-12-02 LAB — HEMOGLOBIN A1C
Hgb A1c MFr Bld: 6.6 % of total Hgb — ABNORMAL HIGH (ref <5.7)
eAG (mmol/L): 7.9 mmol/L

## 2022-12-02 LAB — MICROALBUMIN / CREATININE URINE RATIO: Microalb, Ur: 2 mg/dL

## 2022-12-04 LAB — CBC WITH DIFFERENTIAL/PLATELET
Absolute Monocytes: 288 cells/uL (ref 200–950)
Basophils Absolute: 28 cells/uL (ref 0–200)
Basophils Relative: 0.7 %
Eosinophils Absolute: 148 cells/uL (ref 15–500)
Eosinophils Relative: 3.7 %
HCT: 38.9 % (ref 35.0–45.0)
Hemoglobin: 13 g/dL (ref 11.7–15.5)
Lymphs Abs: 2576 cells/uL (ref 850–3900)
MCH: 29 pg (ref 27.0–33.0)
MCHC: 33.4 g/dL (ref 32.0–36.0)
MCV: 86.6 fL (ref 80.0–100.0)
MPV: 10.5 fL (ref 7.5–12.5)
Monocytes Relative: 7.2 %
Neutro Abs: 960 cells/uL — ABNORMAL LOW (ref 1500–7800)
Neutrophils Relative %: 24 %
Platelets: 316 10*3/uL (ref 140–400)
RBC: 4.49 10*6/uL (ref 3.80–5.10)
RDW: 13.1 % (ref 11.0–15.0)
Total Lymphocyte: 64.4 %
WBC: 4 10*3/uL (ref 3.8–10.8)

## 2022-12-04 LAB — COMPLETE METABOLIC PANEL WITH GFR
AST: 22 U/L (ref 10–35)
Albumin: 4.5 g/dL (ref 3.6–5.1)
Alkaline phosphatase (APISO): 59 U/L (ref 37–153)
Chloride: 104 mmol/L (ref 98–110)
Creat: 0.69 mg/dL (ref 0.50–1.03)
Globulin: 2.7 g/dL (calc) (ref 1.9–3.7)
Glucose, Bld: 104 mg/dL — ABNORMAL HIGH (ref 65–99)
Potassium: 4 mmol/L (ref 3.5–5.3)
Sodium: 140 mmol/L (ref 135–146)
Total Bilirubin: 0.4 mg/dL (ref 0.2–1.2)
Total Protein: 7.2 g/dL (ref 6.1–8.1)
eGFR: 102 mL/min/{1.73_m2} (ref >=60)

## 2022-12-04 LAB — VITAMIN B1: Vitamin B1 (Thiamine): 16 nmol/L (ref 8–30)

## 2022-12-04 LAB — LIPID PANEL
Cholesterol: 178 mg/dL (ref <200)
HDL: 43 mg/dL — ABNORMAL LOW (ref >=50)
LDL Cholesterol (Calc): 106 mg/dL (calc) — ABNORMAL HIGH
Non-HDL Cholesterol (Calc): 135 mg/dL (calc) — ABNORMAL HIGH (ref <130)
Total CHOL/HDL Ratio: 4.1 (calc) (ref <5.0)
Triglycerides: 176 mg/dL — ABNORMAL HIGH (ref <150)

## 2022-12-04 LAB — MICROALBUMIN / CREATININE URINE RATIO
Creatinine, Urine: 263 mg/dL (ref 20–275)
Microalb Creat Ratio: 8 mg/g creat (ref <30)

## 2022-12-04 LAB — B12 AND FOLATE PANEL
Folate: >24 ng/mL
Vitamin B-12: 565 pg/mL (ref 200–1100)

## 2022-12-04 LAB — TSH: TSH: 2.59 mIU/L

## 2022-12-04 LAB — HEMOGLOBIN A1C: Mean Plasma Glucose: 143 mg/dL

## 2022-12-04 LAB — VITAMIN D 25 HYDROXY (VIT D DEFICIENCY, FRACTURES): Vit D, 25-Hydroxy: 48 ng/mL (ref 30–100)

## 2022-12-10 ENCOUNTER — Ambulatory Visit
Admission: RE | Admit: 2022-12-10 | Discharge: 2022-12-10 | Disposition: A | Discharge status: Home / Self Care | Payer: BC Managed Care – PPO | Source: Ambulatory Visit | Attending: Family Medicine | Admitting: Family Medicine

## 2022-12-10 DIAGNOSIS — Z1231 Encounter for screening mammogram for malignant neoplasm of breast: Secondary | ICD-10-CM | POA: Insufficient documentation

## 2022-12-21 LAB — HM DIABETES EYE EXAM

## 2022-12-21 NOTE — Progress Notes (Unsigned)
Name: Debra Contreras   MRN: [id number]    DOB: 1966/02/22   Date:12/22/2022       Progress Note  Subjective  Chief Complaint  Follow Up  HPI  Thyroid nodule: she was sitting on her desk working on a report and felt a large nodule on the left side back in October 2016, she had a partial thyroidectomy ( removal of left side) in March 2017. Last level was normal    DMII with renal manifestation, gingivitis - under the care of dentist , obesity, dyslipidemia: no longer taking Metformin but still takes Lisinopril 10 mg for proteinuria, urine micro was elevated in the past at  50 She denies polyphagia, polydipsia or polyuria Today A1C is up from 6 % to 6.3 % , last A1C was 6 % it went up to 6.7 %  and last level was down to 6.6 % .  She is on Crestor 10 mg discussed LDL not at goal, she does not want to add another medication at this time.    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 Weight went down to 186 lbs ( lowest after bariatric surgery ). Weight was 251.7 lbs, she gained to 161 lbs in early 2021-12-27  today weight is down to 251 lbs   Seborrhea capitis: she using otc medication, she states it was going from the scalp to forehead but face has improved   HTN: she is compliant with medication, denies chest pain, palpitation or dizziness. BP is at goal . Continue Lisinopril    OSA:   Symptoms improved with weight loss, but she has gained it back,  she wakes up feeling tired, she needs to go back for a sleep study , she started a new job in August , she will contact insurance to find out the cost of a study   Dysthymia: mother died 05-27-2022and she is her nephew's guardian, he is 10 years. There is a lot of adjustment going on , he is behind in reading and she has arranged  for tutoring. She states his mother takes drugs and he keeps trying to contact her. Recently he lost a grandmother figure and was sad. 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   History of partial thyroidectomy 10/05/2015   Morbid obesity (HCC) 06/04/2015   Breast lump in female 05/17/2015   Acanthosis nigricans 03/09/2015   Allergic rhinitis, seasonal 03/09/2015   Controlled type 2 diabetes mellitus with microalbuminuria (HCC) 03/09/2015   Dyslipidemia 03/09/2015   Gastro-esophageal reflux disease without esophagitis 03/09/2015   Hiatal hernia 03/09/2015   History of anemia 03/09/2015   H/O: hysterectomy 03/09/2015   Calculus of kidney 03/09/2015   Benign hypertension 03/09/2015   Vitamin D deficiency 03/09/2015   OSA (obstructive sleep apnea) 09/12/2014   Microalbuminuria 05/29/2014    Past Surgical History:  Procedure Laterality Date   ABDOMINAL HYSTERECTOMY  03/2012   BREAST BIOPSY Right 12/27/05   Normal-Cyst   BREAST BIOPSY Right 12-27-05   BREAST REDUCTION SURGERY     BREAST SURGERY Bilateral 1990   Reduction   BREAST SURGERY Bilateral 1990   COLONOSCOPY  WITH PROPOFOL N/A 05/07/2017   Procedure: COLONOSCOPY WITH PROPOFOL;  Surgeon: Wyline Mood, MD;  Location: [facility] ENDOSCOPY;  Service: Gastroenterology;  Laterality: N/A;   COLONOSCOPY WITH PROPOFOL N/A 10/14/2020   Procedure: COLONOSCOPY WITH PROPOFOL;  Surgeon: Regis Bill, MD;  Location: [facility] ENDOSCOPY;  Service: Endoscopy;  Laterality: N/A;   OOPHORECTOMY Left 03/2011   REDUCTION MAMMAPLASTY Bilateral    REDUCTION MAMMAPLASTY Bilateral    SLEEVE GASTROPLASTY  09/2016   [place]   thyroidectomy Left 10/08/2015    Family History  Problem Relation Age of Onset   Cancer Mother 73       died complications of liver cancer   Hypertension Mother    Cirrhosis Mother    COPD Mother        Heavy Smoker    Hepatitis C Mother    Alcohol abuse Father    Heart disease Father    Heart attack Father    Diabetes Brother    Obesity Brother    Lung cancer Maternal Grandmother        Snuff   Diabetes Maternal Grandmother    Alcoholism Maternal Grandmother    Alcoholism Maternal Grandfather    Diabetes Maternal Grandfather    Prostate cancer Paternal Grandfather    Cancer Maternal Uncle        colon cancer   Cancer Cousin 20       colon cancer   Breast cancer Cousin     Social History   Tobacco Use   Smoking status: Never   Smokeless tobacco: Never  Substance Use Topics   Alcohol use: Yes    Alcohol/week: 0.0 standard drinks of alcohol    Comment: wine-social drinker     Current Outpatient Medications:    Biotin 5000 MCG TABS, Take 5,000 mg by mouth 3 (three) times a week., Disp: , Rfl:    Cholecalciferol (VITAMIN D) 2000 units CAPS, Take 1 capsule (2,000 Units total) by mouth daily., Disp: 30 capsule, Rfl:    DULoxetine (CYMBALTA) 60 MG capsule, Take 1 capsule by mouth once daily, Disp: 90 capsule, Rfl: 0   ELDERBERRY PO, Take by mouth., Disp: , Rfl:    Flaxseed, Linseed, (FLAXSEED OIL PO), Take by mouth., Disp: , Rfl:    fluticasone (FLONASE) 50 MCG/ACT nasal spray, Place 2 sprays into both nostrils daily., Disp: 16 g, Rfl: 6   hydrocortisone cream 1 %, Apply 1 application topically as needed for itching., Disp: , Rfl:    lisinopril (ZESTRIL) 10 MG tablet, Take 1 tablet (10 mg total) by mouth daily., Disp: 90 tablet, Rfl: 1   loratadine (CLARITIN) 10 MG tablet, Take by mouth., Disp: , Rfl:    Multiple Vitamin (MULTIVITAMIN) tablet, Take 1 tablet by mouth daily., Disp: , Rfl:    rosuvastatin (CRESTOR) 10 MG tablet, Take 1 tablet by mouth once daily, Disp: 90 tablet, Rfl: 0   Selenium Sulfide 2.3 % SHAM, Apply 1 each topically 2 (two) times a week., Disp: 180 mL, Rfl: 2   Thiamine HCl (VITAMIN B-1 PO), Take 500 mg by mouth daily., Disp: , Rfl:    Turmeric 500 MG CAPS, Take by  mouth., Disp: , Rfl:    vitamin B-12 (CYANOCOBALAMIN) 1000 MCG tablet, Take 1,000 mcg by mouth 2 (two) times a week., Disp: , Rfl:    vitamin C (ASCORBIC ACID) 500 MG tablet, Take 500 mg by mouth daily., Disp: , Rfl:   Allergies  Allergen Reactions   Zinc     Nausea, vomiting  and diarrhea    Latex Rash    I personally reviewed active problem list, medication list, allergies, family history, social history, health maintenance with the patient/caregiver today.   ROS  Ten systems reviewed and is negative except as mentioned in HPI   Objective  Vitals:   12/22/22 0749  BP: 126/72  Pulse: 91  Resp: 16  SpO2: 96%  Weight: 251 lb (113.9 kg)  Height: 5\' 1"  (1.549 m)    Body mass index is 47.43 kg/m.  Physical Exam  Constitutional: Patient appears well-developed and well-nourished. Obese  No distress.  HEENT: head atraumatic, normocephalic, pupils equal and reactive to light, neck supple Cardiovascular: Normal rate, regular rhythm and normal heart sounds.  No murmur heard. No BLE edema. Pulmonary/Chest: Effort normal and breath sounds normal. No respiratory distress. Abdominal: Soft.  There is no tenderness. Psychiatric: Patient has a normal mood and affect. behavior is normal. Judgment and thought content normal.    PHQ2/9:    12/22/2022    7:49 AM 12/01/2022    7:38 AM 11/04/2022    1:45 PM 08/14/2022    7:58 AM 05/11/2022    8:47 AM  Depression screen PHQ 2/9  Decreased Interest 0 0 0 0 0  Down, Depressed, Hopeless 0 0 0 1 0  PHQ - 2 Score 0 0 0 1 0  Altered sleeping 0 0  0 0  Tired, decreased energy 0 0  0 0  Change in appetite 0 0  0 0  Feeling bad or failure about yourself  0 0  0 0  Trouble concentrating 0 0  0 0  Moving slowly or fidgety/restless 0 0  0 0  Suicidal thoughts 0 0  0 0  PHQ-9 Score 0 0  1 0    phq 9 is negative   Fall Risk:    12/22/2022    7:49 AM 12/01/2022    7:38 AM 11/04/2022    1:45 PM 08/14/2022    7:58 AM 05/11/2022    8:47 AM  Fall  Risk   Falls in the past year? 1 1 1  0 0  Number falls in past yr: 0 0 1 0 0  Injury with Fall? 0 0 0 0 0  Risk for fall due to : No Fall Risks No Fall Risks  No Fall Risks No Fall Risks  Follow up Falls prevention discussed Falls prevention discussed  Falls prevention discussed Falls prevention discussed      Functional Status Survey: Is the patient deaf or have difficulty hearing?: No Does the patient have difficulty seeing, even when wearing glasses/contacts?: No Does the patient have difficulty concentrating, remembering, or making decisions?: No Does the patient have difficulty walking or climbing stairs?: No Does the patient have difficulty dressing or bathing?: No Does the patient have difficulty doing errands alone such as visiting a doctor's office or shopping?: No    Assessment & Plan  1. Dyslipidemia associated with type 2 diabetes mellitus (HCC)  - rosuvastatin (CRESTOR) 10 MG tablet; Take 1 tablet (10 mg total) by mouth daily.  Dispense: 90 tablet; Refill: 0 - Semaglutide (RYBELSUS) 7 MG TABS; Take 1 tablet (7 mg total) by mouth daily.  Dispense: 90 tablet; Refill: 0  2. Morbid obesity (HCC)  Discussed with the patient the risk posed by an increased BMI. Discussed importance of portion control, calorie counting and at least 150 minutes of physical activity weekly. Avoid sweet beverages and drink more water. Eat at least 6 servings of  fruit and vegetables daily    3. Benign hypertension  - lisinopril (ZESTRIL) 10 MG tablet; Take 1 tablet (10 mg total) by mouth daily.  Dispense: 90 tablet; Refill: 1  4. Dysthymia  - DULoxetine (CYMBALTA) 60 MG capsule; Take 1 capsule (60 mg total) by mouth daily.  Dispense: 90 capsule; Refill: 0  5. Microalbuminuria  - lisinopril (ZESTRIL) 10 MG tablet; Take 1 tablet (10 mg total) by mouth daily.  Dispense: 90 tablet; Refill: 1  6. Obstructive sleep apnea syndrome   7. History of bariatric surgery

## 2022-12-22 ENCOUNTER — Encounter: Payer: Self-pay | Admitting: Family Medicine

## 2022-12-22 ENCOUNTER — Ambulatory Visit (INDEPENDENT_AMBULATORY_CARE_PROVIDER_SITE_OTHER): Payer: BC Managed Care – PPO | Admitting: Family Medicine

## 2022-12-22 VITALS — BP 126/72 | HR 91 | Resp 16 | Ht 61.0 in | Wt 251.0 lb

## 2022-12-22 DIAGNOSIS — F341 Dysthymic disorder: Secondary | ICD-10-CM | POA: Diagnosis not present

## 2022-12-22 DIAGNOSIS — E785 Hyperlipidemia, unspecified: Secondary | ICD-10-CM

## 2022-12-22 DIAGNOSIS — E1169 Type 2 diabetes mellitus with other specified complication: Secondary | ICD-10-CM | POA: Diagnosis not present

## 2022-12-22 DIAGNOSIS — I10 Essential (primary) hypertension: Secondary | ICD-10-CM | POA: Diagnosis not present

## 2022-12-22 DIAGNOSIS — G4733 Obstructive sleep apnea (adult) (pediatric): Secondary | ICD-10-CM

## 2022-12-22 DIAGNOSIS — R809 Proteinuria, unspecified: Secondary | ICD-10-CM

## 2022-12-22 DIAGNOSIS — Z9884 Bariatric surgery status: Secondary | ICD-10-CM

## 2022-12-22 MED ORDER — DULOXETINE HCL 60 MG PO CPEP: 60.0000 mg | ORAL_CAPSULE | Freq: Every day | ORAL | 0 refills | Status: DC

## 2022-12-22 MED ORDER — RYBELSUS 7 MG PO TABS
7.0000 mg | ORAL_TABLET | Freq: Every day | ORAL | 0 refills | Status: DC
Start: 2022-12-22 — End: 2023-03-19

## 2022-12-22 MED ORDER — ROSUVASTATIN CALCIUM 10 MG PO TABS: 10.0000 mg | ORAL_TABLET | Freq: Every day | ORAL | 0 refills | Status: DC

## 2022-12-22 MED ORDER — LISINOPRIL 10 MG PO TABS: 10.0000 mg | ORAL_TABLET | Freq: Every day | ORAL | 1 refills | Status: DC

## 2023-01-26 ENCOUNTER — Telehealth: Payer: BC Managed Care – PPO | Admitting: Physician Assistant

## 2023-01-26 DIAGNOSIS — L219 Seborrheic dermatitis, unspecified: Secondary | ICD-10-CM | POA: Diagnosis not present

## 2023-01-26 MED ORDER — KETOCONAZOLE 2 % EX SHAM
1.0000 | MEDICATED_SHAMPOO | CUTANEOUS | 0 refills | Status: DC
Start: 2023-01-28 — End: 2024-03-28

## 2023-01-26 MED ORDER — FLUCONAZOLE 150 MG PO TABS
150.0000 mg | ORAL_TABLET | ORAL | 0 refills | Status: AC
Start: 2023-01-26 — End: 2023-02-10

## 2023-01-26 MED ORDER — FLUCONAZOLE 150 MG PO TABS
150.0000 mg | ORAL_TABLET | Freq: Once | ORAL | 0 refills | Status: DC
Start: 2023-01-26 — End: 2023-01-26

## 2023-01-26 NOTE — Patient Instructions (Signed)
[facility], thank you for joining Debra Climes, PA-C for today's virtual visit.  While this provider is not your primary care provider (PCP), if your PCP is located in our provider database this encounter information will be shared with them immediately following your visit.   A [facility] MyChart account gives you access to today's visit and all your visits, tests, and labs performed at [facility] " click here if you don't have a [facility] MyChart account or go to [email or web address]  Consent: (Patient) Debra Contreras provided verbal consent for this virtual visit at the beginning of the encounter.  Current Medications:  Current Outpatient Medications:    Biotin 5000 MCG TABS, Take 5,000 mg by mouth 3 (three) times a week., Disp: , Rfl:    Cholecalciferol (VITAMIN D) 2000 units CAPS, Take 1 capsule (2,000 Units total) by mouth daily., Disp: 30 capsule, Rfl:    DULoxetine (CYMBALTA) 60 MG capsule, Take 1 capsule (60 mg total) by mouth daily., Disp: 90 capsule, Rfl: 0   ELDERBERRY PO, Take by mouth., Disp: , Rfl:    Flaxseed, Linseed, (FLAXSEED OIL PO), Take by mouth., Disp: , Rfl:    fluticasone (FLONASE) 50 MCG/ACT nasal spray, Place 2 sprays into both nostrils daily., Disp: 16 g, Rfl: 6   hydrocortisone cream 1 %, Apply 1 application topically as needed for itching., Disp: , Rfl:    lisinopril (ZESTRIL) 10 MG tablet, Take 1 tablet (10 mg total) by mouth daily., Disp: 90 tablet, Rfl: 1   loratadine (CLARITIN) 10 MG tablet, Take by mouth., Disp: , Rfl:    Multiple Vitamin (MULTIVITAMIN) tablet, Take 1 tablet by mouth daily., Disp: , Rfl:    rosuvastatin (CRESTOR) 10 MG tablet, Take 1 tablet (10 mg total) by mouth daily., Disp: 90 tablet, Rfl: 0   Selenium Sulfide 2.3 % SHAM, Apply 1 each topically 2 (two) times a week., Disp: 180 mL, Rfl: 2   Semaglutide (RYBELSUS) 7 MG TABS, Take 1 tablet (7 mg total) by mouth daily., Disp: 90 tablet, Rfl: 0   Thiamine  HCl (VITAMIN B-1 PO), Take 500 mg by mouth daily., Disp: , Rfl:    Turmeric 500 MG CAPS, Take by mouth., Disp: , Rfl:    vitamin B-12 (CYANOCOBALAMIN) 1000 MCG tablet, Take 1,000 mcg by mouth 2 (two) times a week., Disp: , Rfl:    vitamin C (ASCORBIC ACID) 500 MG tablet, Take 500 mg by mouth daily., Disp: , Rfl:    Medications ordered in this encounter:  No orders of the defined types were placed in this encounter.    *If you need refills on other medications prior to your next appointment, please contact your pharmacy*  Follow-Up: Call back or seek an in-person evaluation if the symptoms worsen or if the condition fails to improve as anticipated.  [facility] [phone]  Other Instructions Seborrheic Dermatitis, Adult Seborrheic dermatitis is a skin disease that causes red, scaly patches. It often occurs on the scalp, where it may be called dandruff. The patches may also appear on other parts of the body. Skin patches tend to occur where there are a lot of oil glands in the skin. Areas of the body that may be affected include: The scalp. The face, eyebrows, and ears. The area around a beard. Skin folds of the body. This includes the armpits, groin, and buttocks. The chest. The condition is often long-lasting (chronic). It may come and go for no known reason. It may be  activated by a trigger, such as: Cold weather. Being out in the sun. Stress. Drinking alcohol. What are the causes? The cause of this condition is not known. It may be related to having too much yeast on the skin or changes in how your body's disease-fighting system (immune system) works. What increases the risk? You may be more likely to develop this condition if: You have a weak immune system. You are 71 years old or older. You have other conditions, such as: Human immunodeficiency virus (HIV) or acquired immunodeficiency virus (AIDS). Parkinson's disease. Mood disorders, such as  depression. Liver problems. Obesity. What are the signs or symptoms? Symptoms of this condition include: Thick scales on the scalp. Redness on the face or in the armpits. Skin that is flaky. The flakes may be white or yellow. Skin that seems oily or dry but is not helped with moisturizers. Itching or burning in the affected areas. How is this diagnosed? This condition is diagnosed with a medical history and physical exam. A sample of your skin may be tested (skin biopsy). You may need to see a skin specialist (dermatologist). How is this treated? There is no cure for this condition, but treatment can help to manage the symptoms. You may get treatment to remove scales, lower the risk of skin infection, and reduce swelling or itching. Treatment may include: Medicated shampoos, moisturizing creams, or ointments. Creams that reduce skin yeast. Creams that reduce swelling and irritation (steroids). Follow these instructions at home: Skin care Use any medicated shampoo, skin creams, or ointments only as told by your health care provider. Do not use skin products that contain alcohol. Take lukewarm baths or showers. Avoid very hot water. When you are outside, wear a hat and clothes that block UV light. General instructions Apply over-the-counter and prescription medicines only as told by your health care provider. Learn what triggers your symptoms so you can avoid these things. Use techniques for stress reduction, such as meditation or yoga. Do not drink alcohol if your health care provider tells you not to drink. Keep all follow-up visits. Your health care provider will check your skin to make sure the treatments are helping. Where to find more information American Academy of Dermatology: MarketingSheets.si Contact a health care provider if: Your symptoms do not get better with treatment. Your symptoms get worse. You have new symptoms. Get help right away if: Your condition quickly gets worse,  even with treatment. This information is not intended to replace advice given to you by your health care provider. Make sure you discuss any questions you have with your health care provider. Document Revised: 12/19/2021 Document Reviewed: 12/19/2021 Elsevier Patient Education  2024 Elsevier Inc.    If you have been instructed to have an in-person evaluation today at a local Urgent Care facility, please use the link below. It will take you to a list of all of our available [facility], including address, phone number and hours of operation. Please do not delay care.  [facility]  If you or a family member do not have a primary care provider, use the link below to schedule a visit and establish care. When you choose a [facility] primary care physician or advanced practice provider, you gain a long-term partner in health. Find a Primary Care Provider  Learn more about [facility]'s in-office and virtual care options: [facility] - Get Care Now

## 2023-01-26 NOTE — Progress Notes (Signed)
Virtual Visit Consent   [facility], you are scheduled for a virtual visit with a [facility] provider today. Just as with appointments in the office, your consent must be obtained to participate. Your consent will be active for this visit and any virtual visit you may have with one of our providers in the next 365 days. If you have a MyChart account, a copy of this consent can be sent to you electronically.  As this is a virtual visit, video technology does not allow for your provider to perform a traditional examination. This may limit your provider's ability to fully assess your condition. If your provider identifies any concerns that need to be evaluated in person or the need to arrange testing (such as labs, EKG, etc.), we will make arrangements to do so. Although advances in technology are sophisticated, we cannot ensure that it will always work on either your end or our end. If the connection with a video visit is poor, the visit may have to be switched to a telephone visit. With either a video or telephone visit, we are not always able to ensure that we have a secure connection.  By engaging in this virtual visit, you consent to the provision of healthcare and authorize for your insurance to be billed (if applicable) for the services provided during this visit. Depending on your insurance coverage, you may receive a charge related to this service.  I need to obtain your verbal consent now. Are you willing to proceed with your visit today? Debra Contreras has provided verbal consent on 01/26/2023 for a virtual visit (video or telephone). Debra Contreras, [place]  Date: 01/26/2023 1:01 PM  Virtual Visit via Video Note   I, Debra Contreras, connected with  Debra Contreras  ([id number], [birth year]) on 01/26/23 at 12:45 PM EDT by a video-enabled telemedicine application and verified that I am speaking with the correct person using two identifiers.  Location: Patient: Virtual Visit  Location Patient: Home Provider: Virtual Visit Location Provider: Home Office   I discussed the limitations of evaluation and management by telemedicine and the availability of in person appointments. The patient expressed understanding and agreed to proceed.    History of Present Illness: Debra Contreras is a 57 y.o. who identifies as a female who was assigned female at birth, and is being seen today for continues itching and flaking of scalp with redness. Was evaluated for this by her PCP a couple of months ago. Was prescribed a selenium shampoo but was 600.00 so she never got. Has been using OTC head and shoulder with only some help. Notes itching and flaking is substantial and really affecting her.   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   History of partial thyroidectomy 10/05/2015   Morbid obesity (HCC) 06/04/2015   Breast lump in female 05/17/2015   Acanthosis nigricans 03/09/2015   Allergic rhinitis, seasonal 03/09/2015   Controlled type 2 diabetes mellitus with microalbuminuria (HCC) 03/09/2015   Dyslipidemia 03/09/2015   Gastro-esophageal reflux disease without esophagitis 03/09/2015   Hiatal hernia 03/09/2015   History of anemia 03/09/2015   H/O: hysterectomy 03/09/2015   Calculus of kidney 03/09/2015   Benign hypertension 03/09/2015   Vitamin D deficiency 03/09/2015   OSA (obstructive sleep apnea) 09/12/2014   Microalbuminuria 05/29/2014    Allergies:  Allergies  Allergen Reactions   Zinc     Nausea, vomiting and diarrhea  Latex Rash   Medications:  Current Outpatient Medications:    [START ON 01/28/2023] ketoconazole (NIZORAL) 2 % shampoo, Apply 1 Application topically 2 (two) times a week., Disp: 120 mL, Rfl: 0   Biotin 5000 MCG TABS, Take 5,000 mg by mouth 3 (three) times a week., Disp: , Rfl:    Cholecalciferol (VITAMIN D) 2000 units CAPS, Take 1 capsule (2,000 Units total) by  mouth daily., Disp: 30 capsule, Rfl:    DULoxetine (CYMBALTA) 60 MG capsule, Take 1 capsule (60 mg total) by mouth daily., Disp: 90 capsule, Rfl: 0   ELDERBERRY PO, Take by mouth., Disp: , Rfl:    Flaxseed, Linseed, (FLAXSEED OIL PO), Take by mouth., Disp: , Rfl:    fluconazole (DIFLUCAN) 150 MG tablet, Take 1 tablet (150 mg total) by mouth once a week for 3 doses., Disp: 3 tablet, Rfl: 0   fluticasone (FLONASE) 50 MCG/ACT nasal spray, Place 2 sprays into both nostrils daily., Disp: 16 g, Rfl: 6   hydrocortisone cream 1 %, Apply 1 application topically as needed for itching., Disp: , Rfl:    lisinopril (ZESTRIL) 10 MG tablet, Take 1 tablet (10 mg total) by mouth daily., Disp: 90 tablet, Rfl: 1   loratadine (CLARITIN) 10 MG tablet, Take by mouth., Disp: , Rfl:    Multiple Vitamin (MULTIVITAMIN) tablet, Take 1 tablet by mouth daily., Disp: , Rfl:    rosuvastatin (CRESTOR) 10 MG tablet, Take 1 tablet (10 mg total) by mouth daily., Disp: 90 tablet, Rfl: 0   Selenium Sulfide 2.3 % SHAM, Apply 1 each topically 2 (two) times a week., Disp: 180 mL, Rfl: 2   Semaglutide (RYBELSUS) 7 MG TABS, Take 1 tablet (7 mg total) by mouth daily., Disp: 90 tablet, Rfl: 0   Thiamine HCl (VITAMIN B-1 PO), Take 500 mg by mouth daily., Disp: , Rfl:    Turmeric 500 MG CAPS, Take by mouth., Disp: , Rfl:    vitamin B-12 (CYANOCOBALAMIN) 1000 MCG tablet, Take 1,000 mcg by mouth 2 (two) times a week., Disp: , Rfl:    vitamin C (ASCORBIC ACID) 500 MG tablet, Take 500 mg by mouth daily., Disp: , Rfl:   Observations/Objective: Patient is well-developed, well-nourished in no acute distress.  Resting comfortably  at home.  Head is normocephalic, atraumatic.  No labored breathing.  Speech is clear and coherent with logical content.  Patient is alert and oriented at baseline.  Scalp with noted erythema and oily flaking following the hairline. Also top of ears with noted oily flaking as well. No noted hair loss on examination  through video.  Assessment and Plan: 1. Seborrheic dermatitis - ketoconazole (NIZORAL) 2 % shampoo; Apply 1 Application topically 2 (two) times a week.  Dispense: 120 mL; Refill: 0 - fluconazole (DIFLUCAN) 150 MG tablet; Take 1 tablet (150 mg total) by mouth once a week for 3 doses.  Dispense: 3 tablet; Refill: 0  Supportive measures and OTC medications reviewed. Start Ketoconazole shampoo twice weekly. Diflucan once weekly x 3 weeks. PCP follow-up discussed.   Follow Up Instructions: I discussed the assessment and treatment plan with the patient. The patient was provided an opportunity to ask questions and all were answered. The patient agreed with the plan and demonstrated an understanding of the instructions.  A copy of instructions were sent to the patient via MyChart unless otherwise noted below.    The patient was advised to call back or seek an in-person evaluation if the symptoms worsen or if the condition fails  to improve as anticipated.  Time:  I spent 10 minutes with the patient via telehealth technology discussing the above problems/concerns.    Debra Climes, PA-C

## 2023-02-26 ENCOUNTER — Encounter: Payer: Self-pay | Admitting: Family Medicine

## 2023-02-26 ENCOUNTER — Ambulatory Visit: Payer: Self-pay

## 2023-02-26 ENCOUNTER — Telehealth: Payer: BC Managed Care – PPO | Admitting: Family Medicine

## 2023-02-26 DIAGNOSIS — U071 COVID-19: Secondary | ICD-10-CM | POA: Diagnosis not present

## 2023-02-26 MED ORDER — PREDNISONE 20 MG PO TABS: 20.0000 mg | ORAL_TABLET | Freq: Two times a day (BID) | ORAL | 0 refills | Status: AC

## 2023-02-26 MED ORDER — BENZONATATE 200 MG PO CAPS: 200.0000 mg | ORAL_CAPSULE | Freq: Two times a day (BID) | ORAL | 0 refills | Status: DC | PRN

## 2023-02-26 MED ORDER — NIRMATRELVIR/RITONAVIR (PAXLOVID)TABLET: 3.0000 | ORAL_TABLET | Freq: Two times a day (BID) | ORAL | 0 refills | Status: AC

## 2023-02-26 NOTE — Telephone Encounter (Signed)
  Chief Complaint: COVID + Low O2 sats Symptoms: Congestion, sore throat, Nausea, SOB Frequency: yesterday Pertinent Negatives: Patient denies  Disposition: [] ED /[] Urgent Care (no appt availability in office) / [] Appointment(In office/virtual)/ [x]  [facility]/ [] Home Care/ [] Refused Recommended Disposition /[] [facility]/ []  Follow-up with PCP Additional Notes: PT was exposed to COVID on Tuesday. Pt started s/s yesterday. Pt states that she does not feel well enough to come into office. No VV with PCP available. Scheduled MyChart VV. Pt has had COVID in the past and needed steroids as well as other medications, d/t breathing issues. She states her "throat closed up".   Reason for Disposition  MILD difficulty breathing (e.g., minimal/no SOB at rest, SOB with walking, pulse <100)  Answer Assessment - Initial Assessment Questions 1. COVID-19 DIAGNOSIS: "How do you know that you have COVID?" (e.g., positive lab test or self-test, diagnosed by doctor or NP/PA, symptoms after exposure).     Home test 2. COVID-19 EXPOSURE: "Was there any known exposure to COVID before the symptoms began?" CDC Definition of close contact: within 6 feet (2 meters) for a total of 15 minutes or more over a 24-hour period.      Tuesday 3. ONSET: "When did the COVID-19 symptoms start?"      Yesterday afternoon 4. WORST SYMPTOM: "What is your worst symptom?" (e.g., cough, fever, shortness of breath, muscle aches)     Congestion and SOB 5. COUGH: "Do you have a cough?" If Yes, ask: "How bad is the cough?"       no 6. FEVER: "Do you have a fever?" If Yes, ask: "What is your temperature, how was it measured, and when did it start?"     Is taking IBU every 4 hours 7. RESPIRATORY STATUS: "Describe your breathing?" (e.g., normal; shortness of breath, wheezing, unable to speak)      Is having SOB 8. BETTER-SAME-WORSE: "Are you getting better, staying the same or getting worse compared to yesterday?"  If  getting worse, ask, "In what way?"     WOrse 9. OTHER SYMPTOMS: "Do you have any other symptoms?"  (e.g., chills, fatigue, headache, loss of smell or taste, muscle pain, sore throat)     Congestion, Nausea, sore throat. Low O2 sats 10. HIGH RISK DISEASE: "Do you have any chronic medical problems?" (e.g., asthma, heart or lung disease, weak immune system, obesity, etc.)       Had COVID previously and was very sick. 13. O2 SATURATION MONITOR:  "Do you use an oxygen saturation monitor (pulse oximeter) at home?" If Yes, ask "What is your reading (oxygen level) today?" "What is your usual oxygen saturation reading?" (e.g., 95%)       Had low O2 sats last night as low as 84%  Protocols used: Coronavirus (COVID-19) Diagnosed or Suspected-A-AH

## 2023-02-26 NOTE — Progress Notes (Signed)
Virtual Visit Consent   [facility], you are scheduled for a virtual visit with a [facility] provider today. Just as with appointments in the office, your consent must be obtained to participate. Your consent will be active for this visit and any virtual visit you may have with one of our providers in the next 365 days. If you have a MyChart account, a copy of this consent can be sent to you electronically.  As this is a virtual visit, video technology does not allow for your provider to perform a traditional examination. This may limit your provider's ability to fully assess your condition. If your provider identifies any concerns that need to be evaluated in person or the need to arrange testing (such as labs, EKG, etc.), we will make arrangements to do so. Although advances in technology are sophisticated, we cannot ensure that it will always work on either your end or our end. If the connection with a video visit is poor, the visit may have to be switched to a telephone visit. With either a video or telephone visit, we are not always able to ensure that we have a secure connection.  By engaging in this virtual visit, you consent to the provision of healthcare and authorize for your insurance to be billed (if applicable) for the services provided during this visit. Depending on your insurance coverage, you may receive a charge related to this service.  I need to obtain your verbal consent now. Are you willing to proceed with your visit today? Lilijana Stocum has provided verbal consent on 02/26/2023 for a virtual visit (video or telephone). Georgana Curio, FNP  Date: 02/26/2023 9:19 AM  Virtual Visit via Video Note   I, Georgana Curio, connected with  Marliss Czar  ([id number], Feb 08, 1966) on 02/26/23 at  9:15 AM EDT by a video-enabled telemedicine application and verified that I am speaking with the correct person using two identifiers.  Location: Patient: Virtual Visit Location Patient:  Home Provider: Virtual Visit Location Provider: Home Office   I discussed the limitations of evaluation and management by telemedicine and the availability of in person appointments. The patient expressed understanding and agreed to proceed.    History of Present Illness: Debra Contreras is a 57 y.o. who identifies as a female who was assigned female at birth, and is being seen today for covid positive testing this am with sx starting yesterday and exposure Tuesday. She has sore throat, cough, chills, headache, sob.   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   History of partial thyroidectomy 10/05/2015   Morbid obesity (HCC) 06/04/2015   Breast lump in female 05/17/2015   Acanthosis nigricans 03/09/2015   Allergic rhinitis, seasonal 03/09/2015   Controlled type 2 diabetes mellitus with microalbuminuria (HCC) 03/09/2015   Dyslipidemia 03/09/2015   Gastro-esophageal reflux disease without esophagitis 03/09/2015   Hiatal hernia 03/09/2015   History of anemia 03/09/2015   H/O: hysterectomy 03/09/2015   Calculus of kidney 03/09/2015   Benign hypertension 03/09/2015   Vitamin D deficiency 03/09/2015   OSA (obstructive sleep apnea) 09/12/2014   Microalbuminuria 05/29/2014    Allergies:  Allergies  Allergen Reactions   Zinc     Nausea, vomiting and diarrhea    Latex Rash   Medications:  Current Outpatient Medications:    benzonatate (TESSALON) 200 MG capsule, Take 1 capsule (200 mg total) by mouth 2 (two) times daily as needed for cough., Disp:  20 capsule, Rfl: 0   nirmatrelvir/ritonavir (PAXLOVID) 20 x 150 MG & 10 x 100MG  TABS, Take 3 tablets by mouth 2 (two) times daily for 5 days. (Take nirmatrelvir 150 mg two tablets twice daily for 5 days and ritonavir 100 mg one tablet twice daily for 5 days) Patient GFR is norml, Disp: 30 tablet, Rfl: 0   predniSONE (DELTASONE) 20 MG tablet, Take 1 tablet (20 mg  total) by mouth 2 (two) times daily with a meal for 5 days., Disp: 10 tablet, Rfl: 0   Biotin 5000 MCG TABS, Take 5,000 mg by mouth 3 (three) times a week., Disp: , Rfl:    Cholecalciferol (VITAMIN D) 2000 units CAPS, Take 1 capsule (2,000 Units total) by mouth daily., Disp: 30 capsule, Rfl:    DULoxetine (CYMBALTA) 60 MG capsule, Take 1 capsule (60 mg total) by mouth daily., Disp: 90 capsule, Rfl: 0   ELDERBERRY PO, Take by mouth., Disp: , Rfl:    Flaxseed, Linseed, (FLAXSEED OIL PO), Take by mouth., Disp: , Rfl:    fluticasone (FLONASE) 50 MCG/ACT nasal spray, Place 2 sprays into both nostrils daily., Disp: 16 g, Rfl: 6   hydrocortisone cream 1 %, Apply 1 application topically as needed for itching., Disp: , Rfl:    ketoconazole (NIZORAL) 2 % shampoo, Apply 1 Application topically 2 (two) times a week., Disp: 120 mL, Rfl: 0   lisinopril (ZESTRIL) 10 MG tablet, Take 1 tablet (10 mg total) by mouth daily., Disp: 90 tablet, Rfl: 1   loratadine (CLARITIN) 10 MG tablet, Take by mouth., Disp: , Rfl:    Multiple Vitamin (MULTIVITAMIN) tablet, Take 1 tablet by mouth daily., Disp: , Rfl:    rosuvastatin (CRESTOR) 10 MG tablet, Take 1 tablet (10 mg total) by mouth daily., Disp: 90 tablet, Rfl: 0   Selenium Sulfide 2.3 % SHAM, Apply 1 each topically 2 (two) times a week., Disp: 180 mL, Rfl: 2   Semaglutide (RYBELSUS) 7 MG TABS, Take 1 tablet (7 mg total) by mouth daily., Disp: 90 tablet, Rfl: 0   Thiamine HCl (VITAMIN B-1 PO), Take 500 mg by mouth daily., Disp: , Rfl:    Turmeric 500 MG CAPS, Take by mouth., Disp: , Rfl:    vitamin B-12 (CYANOCOBALAMIN) 1000 MCG tablet, Take 1,000 mcg by mouth 2 (two) times a week., Disp: , Rfl:    vitamin C (ASCORBIC ACID) 500 MG tablet, Take 500 mg by mouth daily., Disp: , Rfl:   Observations/Objective: Patient is well-developed, well-nourished in no acute distress.  Resting comfortably  at home.  Head is normocephalic, atraumatic.  No labored breathing.  Speech is  clear and coherent with logical content.  Patient is alert and oriented at baseline.    Assessment and Plan: 1. COVID  Increase fluids, humidifier, med use and side effects per pharmacy. VIT D otc, zinc she is allergic to. Push fluids. UC if sx persist or worsen.   Follow Up Instructions: I discussed the assessment and treatment plan with the patient. The patient was provided an opportunity to ask questions and all were answered. The patient agreed with the plan and demonstrated an understanding of the instructions.  A copy of instructions were sent to the patient via MyChart unless otherwise noted below.     The patient was advised to call back or seek an in-person evaluation if the symptoms worsen or if the condition fails to improve as anticipated.  Time:  I spent 10 minutes with the patient via telehealth technology  discussing the above problems/concerns.    Georgana Curio, FNP

## 2023-02-26 NOTE — Patient Instructions (Signed)

## 2023-03-19 ENCOUNTER — Other Ambulatory Visit: Payer: Self-pay | Admitting: Family Medicine

## 2023-03-19 DIAGNOSIS — E1169 Type 2 diabetes mellitus with other specified complication: Secondary | ICD-10-CM

## 2023-04-07 NOTE — Progress Notes (Unsigned)
Name: Debra Contreras   MRN: [id number]    DOB: 1965/10/03   Date:04/08/2023       Progress Note  Subjective  Chief Complaint  Follow Up  HPI  Thyroid nodule: she was sitting on her desk working on a report and felt a large nodule on the left side back in October 2016, she had a partial thyroidectomy ( removal of left side) in March 2017. Last level was normal    DMII with renal manifestation, gingivitis - under the care of dentist , obesity, dyslipidemia.  proteinuria, urine micro was elevated in the past at  50 She denies polyphagia, polydipsia or polyuria A1C was  6.6 % today is 6.4 %. She is now taking Rybelsus since Spring 2024, still takes lisinopril and Crestor .     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 Weight went down to 186 lbs ( lowest after bariatric surgery ). Weight was 251.7 lbs, she gained to 161 lbs in early 2023 , it has been in the 250 -253 lbs since Spring 2024   Seborrhea capitis: symptoms are getting worse, she states over the past two month noticing more hair loss, she did a virtual visit and was oral antifungal plus topical nizoral that is only causing more scalp dryness, , large balding areas on nuchal area and also on temporal right side.   HTN: she is taking lisinopril , denies chest pain, palpitation or dizziness. BP is at goal .   OSA:   Symptoms improved with weight loss, but she has gained it back,  she wakes up feeling tired, she needs to go back for a sleep study , she does not want to have a sleep study at this time   Dysthymia: mother died 05-12-22and she is her nephew's guardian, he is 10 years. There is a lot of adjustment going on , he is behind in reading and she has arranged for tutoring. She states his mother takes drugs  and he keeps trying to contact her. Recently he lost a grandmother figure and was sad. 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   History of partial thyroidectomy 10/05/2015   Morbid obesity (HCC) 06/04/2015   Breast lump in female 05/17/2015   Acanthosis nigricans 03/09/2015   Allergic rhinitis, seasonal 03/09/2015   Controlled type 2 diabetes mellitus with microalbuminuria (HCC) 03/09/2015   Dyslipidemia 03/09/2015   Gastro-esophageal reflux disease without esophagitis 03/09/2015   Hiatal hernia 03/09/2015   History of anemia 03/09/2015   H/O: hysterectomy 03/09/2015   Calculus of kidney 03/09/2015   Benign hypertension 03/09/2015   Vitamin D deficiency 03/09/2015   OSA (obstructive sleep apnea) 09/12/2014   Microalbuminuria 05/29/2014    Past Surgical History:  Procedure Laterality Date   ABDOMINAL HYSTERECTOMY  03/2012   BREAST BIOPSY Right 12/12/2005   Normal-Cyst   BREAST BIOPSY Right Dec 12, 2005   BREAST REDUCTION SURGERY     BREAST SURGERY Bilateral 1990   Reduction   BREAST SURGERY Bilateral 1990   COLONOSCOPY WITH PROPOFOL N/A 05/07/2017   Procedure: COLONOSCOPY WITH PROPOFOL;  Surgeon: Wyline Mood, MD;  Location: [facility] ENDOSCOPY;  Service: Gastroenterology;  Laterality: N/A;   COLONOSCOPY WITH PROPOFOL N/A 10/14/2020   Procedure: COLONOSCOPY WITH PROPOFOL;  Surgeon: Regis Bill, MD;  Location: [facility] ENDOSCOPY;  Service: Endoscopy;  Laterality: N/A;   OOPHORECTOMY Left 03/2011   REDUCTION MAMMAPLASTY Bilateral    REDUCTION MAMMAPLASTY Bilateral    SLEEVE GASTROPLASTY  09/2016   [place]   thyroidectomy Left 10/08/2015    Family History  Problem Relation Age of Onset   Cancer Mother 40       died complications of liver cancer   Hypertension Mother    Cirrhosis Mother    COPD Mother        Heavy Smoker   Hepatitis C Mother    Alcohol abuse  Father    Heart disease Father    Heart attack Father    Diabetes Brother    Obesity Brother    Lung cancer Maternal Grandmother        Snuff   Diabetes Maternal Grandmother    Alcoholism Maternal Grandmother    Alcoholism Maternal Grandfather    Diabetes Maternal Grandfather    Prostate cancer Paternal Grandfather    Cancer Maternal Uncle        colon cancer   Cancer Cousin 74       colon cancer   Breast cancer Cousin     Social History   Tobacco Use   Smoking status: Never   Smokeless tobacco: Never  Substance Use Topics   Alcohol use: Yes    Alcohol/week: 0.0 standard drinks of alcohol    Comment: wine-social drinker     Current Outpatient Medications:    benzonatate (TESSALON) 200 MG capsule, Take 1 capsule (200 mg total) by mouth 2 (two) times daily as needed for cough., Disp: 20 capsule, Rfl: 0   Biotin 5000 MCG TABS, Take 5,000 mg by mouth 3 (three) times a week., Disp: , Rfl:    Cholecalciferol (VITAMIN D) 2000 units CAPS, Take 1 capsule (2,000 Units total) by mouth daily., Disp: 30 capsule, Rfl:    DULoxetine (CYMBALTA) 60 MG capsule, Take 1 capsule (60 mg total) by mouth daily., Disp: 90 capsule, Rfl: 0   ELDERBERRY PO, Take by mouth., Disp: , Rfl:    Flaxseed, Linseed, (FLAXSEED OIL PO), Take by mouth., Disp: , Rfl:    fluticasone (FLONASE) 50 MCG/ACT nasal spray, Place 2 sprays into both nostrils daily., Disp: 16 g, Rfl: 6   hydrocortisone cream 1 %, Apply 1 application topically as needed for itching., Disp: , Rfl:    ketoconazole (NIZORAL) 2 % shampoo, Apply 1 Application topically 2 (two) times a week., Disp: 120 mL, Rfl: 0   lisinopril (ZESTRIL) 10 MG tablet, Take 1 tablet (10 mg total) by mouth daily., Disp: 90 tablet, Rfl: 1   loratadine (CLARITIN) 10 MG tablet, Take by mouth., Disp: , Rfl:    Multiple Vitamin (MULTIVITAMIN) tablet, Take 1 tablet by mouth daily., Disp: , Rfl:    rosuvastatin (CRESTOR) 10 MG tablet, Take 1 tablet (10 mg total) by mouth  daily., Disp: 90 tablet, Rfl: 0   Selenium Sulfide 2.3 % SHAM, Apply 1 each topically 2 (two) times a week., Disp: 180 mL, Rfl: 2   Semaglutide (RYBELSUS) 7 MG TABS, Take 1 tablet by mouth once daily, Disp: 30 tablet, Rfl: 0   Thiamine HCl (VITAMIN B-1 PO), Take 500 mg by mouth daily., Disp: , Rfl:    Turmeric 500 MG CAPS, Take by mouth., Disp: , Rfl:    vitamin  B-12 (CYANOCOBALAMIN) 1000 MCG tablet, Take 1,000 mcg by mouth 2 (two) times a week., Disp: , Rfl:    vitamin C (ASCORBIC ACID) 500 MG tablet, Take 500 mg by mouth daily., Disp: , Rfl:   Allergies  Allergen Reactions   Zinc     Nausea, vomiting and diarrhea    Latex Rash    I personally reviewed active problem list, medication list, allergies, family history, social history, health maintenance with the patient/caregiver today.   ROS  Constitutional: Negative for fever or weight change.  Respiratory: Negative for cough and shortness of breath.   Cardiovascular: Negative for chest pain or palpitations.  Gastrointestinal: Negative for abdominal pain, no bowel changes.  Musculoskeletal: Negative for gait problem or joint swelling.  Skin: Negative for rash.  Neurological: Negative for dizziness or headache.  No other specific complaints in a complete review of systems (except as listed in HPI above).    Objective  Vitals:   04/08/23 0803  BP: 124/76  Pulse: 83  Resp: 16  SpO2: 97%  Weight: 253 lb (114.8 kg)  Height: 5\' 1"  (1.549 m)    Body mass index is 47.8 kg/m.  Physical Exam  Constitutional: Patient appears well-developed and well-nourished. Obese  No distress.  HEENT: head atraumatic, normocephalic, pupils equal and reactive to light, neck supple Cardiovascular: Normal rate, regular rhythm and normal heart sounds.  No murmur heard. No BLE edema. Pulmonary/Chest: Effort normal and breath sounds normal. No respiratory distress. Abdominal: Soft.  There is no tenderness. Skin: hair loss, patches, some redness  and scaling on left nuchal area  Psychiatric: Patient has a normal mood and affect. behavior is normal. Judgment and thought content normal.   PHQ2/9:    04/08/2023    8:03 AM 12/22/2022    7:49 AM 12/01/2022    7:38 AM 11/04/2022    1:45 PM 08/14/2022    7:58 AM  Depression screen PHQ 2/9  Decreased Interest 0 0 0 0 0  Down, Depressed, Hopeless 0 0 0 0 1  PHQ - 2 Score 0 0 0 0 1  Altered sleeping 0 0 0  0  Tired, decreased energy 0 0 0  0  Change in appetite 0 0 0  0  Feeling bad or failure about yourself  0 0 0  0  Trouble concentrating 0 0 0  0  Moving slowly or fidgety/restless 0 0 0  0  Suicidal thoughts 0 0 0  0  PHQ-9 Score 0 0 0  1    phq 9 is negative   Fall Risk:    04/08/2023    8:03 AM 12/22/2022    7:49 AM 12/01/2022    7:38 AM 11/04/2022    1:45 PM 08/14/2022    7:58 AM  Fall Risk   Falls in the past year? 0 1 1 1  0  Number falls in past yr: 0 0 0 1 0  Injury with Fall? 0 0 0 0 0  Risk for fall due to : No Fall Risks No Fall Risks No Fall Risks  No Fall Risks  Follow up Falls prevention discussed Falls prevention discussed Falls prevention discussed  Falls prevention discussed      Functional Status Survey: Is the patient deaf or have difficulty hearing?: No Does the patient have difficulty seeing, even when wearing glasses/contacts?: No Does the patient have difficulty concentrating, remembering, or making decisions?: No Does the patient have difficulty walking or climbing stairs?: No Does the patient have difficulty  dressing or bathing?: No Does the patient have difficulty doing errands alone such as visiting a doctor's office or shopping?: No    Assessment & Plan  1. Dyslipidemia associated with type 2 diabetes mellitus (HCC)  - POCT HgB A1C - Semaglutide (RYBELSUS) 7 MG TABS; Take 1 tablet (7 mg total) by mouth daily.  Dispense: 90 tablet; Refill: 1 - rosuvastatin (CRESTOR) 10 MG tablet; Take 1 tablet (10 mg total) by mouth daily.  Dispense: 90 tablet;  Refill: 0  2. Alopecia  - Ambulatory referral to Dermatology  3. 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  Dispense: 100 mL; Refill: 0  4. Morbid obesity (HCC)  Discussed with the patient the risk posed by an increased BMI. Discussed importance of portion control, calorie counting and at least 150 minutes of physical activity weekly. Avoid sweet beverages and drink more water. Eat at least 6 servings of fruit and vegetables daily    5. Benign hypertension  Bp is at goal   6. History of partial thyroidectomy  Last TSH at goal   7. Dysthymia  - DULoxetine (CYMBALTA) 60 MG capsule; Take 1 capsule (60 mg total) by mouth daily.  Dispense: 90 capsule; Refill: 1   8. Need for immunization against influenza  - Flu Vaccine QUAD 6+ mos PF IM (Fluarix Quad PF)

## 2023-04-08 ENCOUNTER — Ambulatory Visit: Payer: BC Managed Care – PPO | Admitting: Family Medicine

## 2023-04-08 ENCOUNTER — Encounter: Payer: Self-pay | Admitting: Family Medicine

## 2023-04-08 VITALS — BP 124/76 | HR 83 | Resp 16 | Ht 61.0 in | Wt 253.0 lb

## 2023-04-08 DIAGNOSIS — Z23 Encounter for immunization: Secondary | ICD-10-CM

## 2023-04-08 DIAGNOSIS — E785 Hyperlipidemia, unspecified: Secondary | ICD-10-CM | POA: Diagnosis not present

## 2023-04-08 DIAGNOSIS — L659 Nonscarring hair loss, unspecified: Secondary | ICD-10-CM

## 2023-04-08 DIAGNOSIS — I10 Essential (primary) hypertension: Secondary | ICD-10-CM

## 2023-04-08 DIAGNOSIS — L210 Seborrhea capitis: Secondary | ICD-10-CM

## 2023-04-08 DIAGNOSIS — Z7984 Long term (current) use of oral hypoglycemic drugs: Secondary | ICD-10-CM

## 2023-04-08 DIAGNOSIS — E890 Postprocedural hypothyroidism: Secondary | ICD-10-CM

## 2023-04-08 DIAGNOSIS — F341 Dysthymic disorder: Secondary | ICD-10-CM

## 2023-04-08 DIAGNOSIS — E1169 Type 2 diabetes mellitus with other specified complication: Secondary | ICD-10-CM | POA: Diagnosis not present

## 2023-04-08 LAB — POCT GLYCOSYLATED HEMOGLOBIN (HGB A1C): Hemoglobin A1C: 6.4 % — AB (ref 4.0–5.6)

## 2023-04-08 MED ORDER — CLOBETASOL PROPIONATE 0.05 % EX SOLN: 1.0000 | Freq: Two times a day (BID) | CUTANEOUS | 0 refills | Status: DC

## 2023-04-08 MED ORDER — ROSUVASTATIN CALCIUM 10 MG PO TABS
10.0000 mg | ORAL_TABLET | Freq: Every day | ORAL | 0 refills | Status: DC
Start: 2023-04-08 — End: 2023-08-31

## 2023-04-08 MED ORDER — DULOXETINE HCL 60 MG PO CPEP
60.0000 mg | ORAL_CAPSULE | Freq: Every day | ORAL | 1 refills | Status: DC
Start: 2023-04-08 — End: 2023-08-31

## 2023-04-08 MED ORDER — RYBELSUS 7 MG PO TABS
1.0000 | ORAL_TABLET | Freq: Every day | ORAL | 1 refills | Status: DC
Start: 2023-04-08 — End: 2023-08-31

## 2023-04-08 NOTE — Addendum Note (Signed)
Addended by: Dollene Primrose on: 04/08/2023 08:35 AM   Modules accepted: Orders

## 2023-04-16 ENCOUNTER — Other Ambulatory Visit: Payer: Self-pay

## 2023-04-16 DIAGNOSIS — L210 Seborrhea capitis: Secondary | ICD-10-CM

## 2023-04-16 MED ORDER — CLOBETASOL PROPIONATE 0.05 % EX SOLN
1.0000 | Freq: Two times a day (BID) | CUTANEOUS | 0 refills | Status: DC
Start: 2023-04-16 — End: 2024-03-28

## 2023-05-14 ENCOUNTER — Encounter: Payer: Self-pay | Admitting: *Deleted

## 2023-05-24 ENCOUNTER — Ambulatory Visit: Payer: BC Managed Care – PPO | Admitting: Anesthesiology

## 2023-05-24 ENCOUNTER — Encounter
Admission: RE | Disposition: A | Discharge status: Home / Self Care | Payer: Self-pay | Source: Home / Self Care | Attending: Gastroenterology

## 2023-05-24 ENCOUNTER — Ambulatory Visit
Admission: RE | Admit: 2023-05-24 | Discharge: 2023-05-24 | Disposition: A | Discharge status: Home / Self Care | Payer: BC Managed Care – PPO | Attending: Gastroenterology | Admitting: Gastroenterology

## 2023-05-24 DIAGNOSIS — D120 Benign neoplasm of cecum: Secondary | ICD-10-CM | POA: Insufficient documentation

## 2023-05-24 DIAGNOSIS — E785 Hyperlipidemia, unspecified: Secondary | ICD-10-CM | POA: Diagnosis not present

## 2023-05-24 DIAGNOSIS — E119 Type 2 diabetes mellitus without complications: Secondary | ICD-10-CM | POA: Diagnosis not present

## 2023-05-24 DIAGNOSIS — D122 Benign neoplasm of ascending colon: Secondary | ICD-10-CM | POA: Insufficient documentation

## 2023-05-24 DIAGNOSIS — K648 Other hemorrhoids: Secondary | ICD-10-CM | POA: Insufficient documentation

## 2023-05-24 DIAGNOSIS — Z1211 Encounter for screening for malignant neoplasm of colon: Secondary | ICD-10-CM | POA: Diagnosis not present

## 2023-05-24 DIAGNOSIS — Z7984 Long term (current) use of oral hypoglycemic drugs: Secondary | ICD-10-CM | POA: Diagnosis not present

## 2023-05-24 DIAGNOSIS — D123 Benign neoplasm of transverse colon: Secondary | ICD-10-CM | POA: Insufficient documentation

## 2023-05-24 DIAGNOSIS — Z9884 Bariatric surgery status: Secondary | ICD-10-CM | POA: Diagnosis not present

## 2023-05-24 DIAGNOSIS — K449 Diaphragmatic hernia without obstruction or gangrene: Secondary | ICD-10-CM | POA: Insufficient documentation

## 2023-05-24 DIAGNOSIS — G4730 Sleep apnea, unspecified: Secondary | ICD-10-CM | POA: Insufficient documentation

## 2023-05-24 DIAGNOSIS — K5730 Diverticulosis of large intestine without perforation or abscess without bleeding: Secondary | ICD-10-CM | POA: Diagnosis not present

## 2023-05-24 DIAGNOSIS — I10 Essential (primary) hypertension: Secondary | ICD-10-CM | POA: Insufficient documentation

## 2023-05-24 DIAGNOSIS — Z90710 Acquired absence of both cervix and uterus: Secondary | ICD-10-CM | POA: Diagnosis not present

## 2023-05-24 DIAGNOSIS — K219 Gastro-esophageal reflux disease without esophagitis: Secondary | ICD-10-CM | POA: Diagnosis not present

## 2023-05-24 DIAGNOSIS — K640 First degree hemorrhoids: Secondary | ICD-10-CM | POA: Diagnosis not present

## 2023-05-24 DIAGNOSIS — K635 Polyp of colon: Secondary | ICD-10-CM | POA: Diagnosis not present

## 2023-05-24 DIAGNOSIS — Z860101 Personal history of adenomatous and serrated colon polyps: Secondary | ICD-10-CM | POA: Diagnosis not present

## 2023-05-24 HISTORY — PX: HEMOSTASIS CONTROL: SHX6838

## 2023-05-24 HISTORY — PX: COLONOSCOPY WITH PROPOFOL: SHX5780

## 2023-05-24 HISTORY — PX: POLYPECTOMY: SHX5525

## 2023-05-24 SURGERY — COLONOSCOPY WITH PROPOFOL
Anesthesia: General

## 2023-05-24 MED ORDER — LIDOCAINE HCL (PF) 2 % IJ SOLN
INTRAMUSCULAR | Status: DC | PRN
Start: 2023-05-24 — End: 2023-05-24
  Administered 2023-05-24: 100 mg via INTRADERMAL

## 2023-05-24 MED ORDER — SODIUM CHLORIDE 0.9 % IV SOLN: INTRAVENOUS | Status: DC

## 2023-05-24 MED ORDER — LIDOCAINE HCL URETHRAL/MUCOSAL 2 % EX GEL
CUTANEOUS | Status: DC | PRN
Start: 2023-05-24 — End: 2023-05-24
  Administered 2023-05-24: 1 via TOPICAL

## 2023-05-24 MED ORDER — PROPOFOL 500 MG/50ML IV EMUL
INTRAVENOUS | Status: DC | PRN
  Administered 2023-05-24: 50 mg via INTRAVENOUS
  Administered 2023-05-24: 150 ug/kg/min via INTRAVENOUS

## 2023-05-24 NOTE — Transfer of Care (Signed)
Immediate Anesthesia Transfer of Care Note  Patient: Debra Contreras  Procedure(s) Performed: COLONOSCOPY WITH PROPOFOL  Patient Location: PACU  Anesthesia Type:General  Level of Consciousness: drowsy  Airway & Oxygen Therapy: Patient Spontanous Breathing, Patient connected to nasal cannula oxygen, and nasal trumpet remains in left nostril  Post-op Assessment: Report given to RN and Post -op Vital signs reviewed and stable  Post vital signs: stable  Last Vitals:  Vitals Value Taken Time  BP    Temp    Pulse 76 05/24/23 1022  Resp    SpO2 92 % 05/24/23 1022  Vitals shown include unfiled device data.  Last Pain:  Vitals:   05/24/23 0839  TempSrc: Temporal         Complications: No notable events documented.

## 2023-05-24 NOTE — H&P (Signed)
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 Last colonoscopy in 2022. Patient was supposed to follow-up 6 months after last colonoscopy but never did. No blood thinners. History of hysterectomy and sleeve gastrectomy. No family history of colon cancer.    Current Facility-Administered Medications:    0.9 %  sodium chloride infusion, , Intravenous, Continuous, Itati Brocksmith, Rossie Muskrat, MD, Last Rate: 20 mL/hr at 05/24/23 0935, Continued from Pre-op at 05/24/23 0935  Medications Prior to Admission  Medication Sig Dispense Refill Last Dose   Biotin 5000 MCG TABS Take 5,000 mg by mouth 3 (three) times a week.   Past Week   Cholecalciferol (VITAMIN D) 2000 units CAPS Take 1 capsule (2,000 Units total) by mouth daily. 30 capsule  Past Week   clobetasol (TEMOVATE) 0.05 % external solution Apply 1 Application topically 2 (two) times daily. 100 mL 0 Past Week   DULoxetine (CYMBALTA) 60 MG capsule Take 1 capsule (60 mg total) by mouth daily. 90 capsule 1 Past Week   lisinopril (ZESTRIL) 10 MG tablet Take 1 tablet (10 mg total) by mouth daily. 90 tablet 1 Past Week   loratadine (CLARITIN) 10 MG tablet Take by mouth.   Past Week   rosuvastatin (CRESTOR) 10 MG tablet Take 1 tablet (10 mg total) by mouth daily. 90 tablet 0 Past Week   Turmeric 500 MG CAPS Take by mouth.   Past Week   vitamin B-12 (CYANOCOBALAMIN) 1000 MCG tablet Take 1,000 mcg by mouth 2 (two) times a week.   Past Week   vitamin C (ASCORBIC ACID) 500 MG tablet Take 500 mg by mouth daily.   Past Week   ELDERBERRY PO Take by mouth.      Flaxseed, Linseed, (FLAXSEED OIL PO) Take by mouth.      fluticasone (FLONASE) 50 MCG/ACT nasal  spray Place 2 sprays into both nostrils daily. 16 g 6    hydrocortisone cream 1 % Apply 1 application topically as needed for itching.      ketoconazole (NIZORAL) 2 % shampoo Apply 1 Application topically 2 (two) times a week. 120 mL 0    Multiple Vitamin (MULTIVITAMIN) tablet Take 1 tablet by mouth daily.      Selenium Sulfide 2.3 % SHAM Apply 1 each topically 2 (two) times a week. 180 mL 2    Semaglutide (RYBELSUS) 7 MG TABS Take 1 tablet (7 mg total) by mouth daily. 90 tablet 1 05/20/2023   Thiamine HCl (VITAMIN B-1 PO) Take 500 mg by mouth daily.        Allergies  Allergen Reactions   Zinc     Nausea, vomiting and diarrhea    Latex Rash     Past Medical History:  Diagnosis Date   Acanthosis nigricans 03/09/2015   Allergic rhinitis 03/09/2015   Allergy    Breast lump in female 10/142016   Calculus of kidney    GERD (gastroesophageal reflux disease)    Hiatal hernia 03/09/2015   History of anemia 03/09/2015   History of kidney stones    History of PCOS    History of thyroid nodule    Hyperlipidemia    Hypertension    Kidney stones    Microalbuminuria  DM   Microalbuminuria 05/29/2014   Morbid obesity (HCC) 06/04/2015   Pre-diabetes    Sleep apnea    Snoring    Uterus, adenomyosis    Vitamin D deficiency    Vitamin D deficiency 03/09/2015    Review of systems:  Otherwise negative.    Physical Exam  Gen: Alert, oriented. Appears stated age.  HEENT: PERRLA. Lungs: No respiratory distress CV: RRR Abd: soft, benign, no masses Ext: No edema    Planned procedures: Proceed with colonoscopy. The patient understands the nature of the planned procedure, indications, risks, alternatives and potential complications including but not limited to bleeding, infection, perforation, damage to internal organs and possible oversedation/side effects from anesthesia. The patient agrees and gives consent to proceed.  Please refer to procedure notes for findings,  recommendations and patient disposition/instructions.     Regis Bill, MD [facility] Gastroenterology

## 2023-05-24 NOTE — Op Note (Signed)
[facility] Gastroenterology Patient Name: Debra Contreras Procedure Date: 05/24/2023 9:45 AM MRN: [id number] Account #: [id number] Date of Birth: [birth year] Admit Type: Outpatient Age: 57 Room: [facility] ENDO ROOM 3 Gender: Female Note Status: Finalized Instrument Name: Prentice Docker [id number] Procedure:             Colonoscopy Indications:           Surveillance: Piecemeal removal of large sessile                         adenoma last colonoscopy (< 3 yrs) Providers:             Eather Colas MD, MD Referring MD:          Onnie Boer. Sowles, MD (Referring MD) Medicines:             Monitored Anesthesia Care Complications:         No immediate complications. Estimated blood loss:                         Minimal. Procedure:             Pre-Anesthesia Assessment:                        - Prior to the procedure, a History and Physical was                         performed, and patient medications and allergies were                         reviewed. The patient is competent. The risks and                         benefits of the procedure and the sedation options and                         risks were discussed with the patient. All questions                         were answered and informed consent was obtained.                         Patient identification and proposed procedure were                         verified by the physician, the nurse, the                         anesthesiologist, the anesthetist and the technician                         in the endoscopy suite. Mental Status Examination:                         alert and oriented. Airway Examination: normal                         oropharyngeal airway and neck mobility. Respiratory  Examination: clear to auscultation. CV Examination:                         normal. Prophylactic Antibiotics: The patient does not                         require prophylactic antibiotics. Prior                          Anticoagulants: The patient has taken no anticoagulant                         or antiplatelet agents. ASA Grade Assessment: III - A                         patient with severe systemic disease. After reviewing                         the risks and benefits, the patient was deemed in                         satisfactory condition to undergo the procedure. The                         anesthesia plan was to use monitored anesthesia care                         (MAC). Immediately prior to administration of                         medications, the patient was re-assessed for adequacy                         to receive sedatives. The heart rate, respiratory                         rate, oxygen saturations, blood pressure, adequacy of                         pulmonary ventilation, and response to care were                         monitored throughout the procedure. The physical                         status of the patient was re-assessed after the                         procedure.                        After obtaining informed consent, the colonoscope was                         passed under direct vision. Throughout the procedure,                         the patient's blood pressure, pulse, and oxygen  saturations were monitored continuously. The                         Colonoscope was introduced through the anus and                         advanced to the the cecum, identified by appendiceal                         orifice and ileocecal valve. The colonoscopy was                         somewhat difficult due to significant looping.                         Successful completion of the procedure was aided by                         applying abdominal pressure. The patient tolerated the                         procedure well. The quality of the bowel preparation                         was good. The ileocecal valve, appendiceal orifice,                          and rectum were photographed. Findings:      The perianal and digital rectal examinations were normal.      A 15 mm polyp was found in the ileocecal valve. The polyp was sessile.       The polyp was removed with a piecemeal technique using a hot snare.       Resection and retrieval were complete. Base and edges of polypectomy       site coaged with soft tip cautery. Purastat was then applied. This polyp       was likely regrowth from previous polypectomy site. Estimated blood loss       was minimal.      A 7 mm polyp was found in the ascending colon. The polyp was sessile.       The polyp was removed with a hot snare. Resection and retrieval were       complete. Purastat was used on base of polypectomy site. This was likely       site of previous EMR of large ascending colon polyp.      A 2 mm polyp was found in the transverse colon. The polyp was sessile.       The polyp was removed with a jumbo cold forceps. Resection and retrieval       were complete. Estimated blood loss was minimal.      A few small-mouthed diverticula were found in the sigmoid colon.      Internal hemorrhoids were found during retroflexion. The hemorrhoids       were Grade I (internal hemorrhoids that do not prolapse).      The exam was otherwise without abnormality on direct and retroflexion       views. Impression:            - One 15 mm polyp at the ileocecal valve, removed  piecemeal using a hot snare. Resected and retrieved.                        - One 7 mm polyp in the ascending colon, removed with                         a hot snare. Resected and retrieved.                        - One 2 mm polyp in the transverse colon, removed with                         a jumbo cold forceps. Resected and retrieved.                        - Diverticulosis in the sigmoid colon.                        - Internal hemorrhoids.                        - The examination was otherwise normal on direct and                          retroflexion views. Recommendation:        - Discharge patient to home.                        - Resume previous diet.                        - Continue present medications.                        - Await pathology results.                        - Repeat colonoscopy in 6-12 months for surveillance                         after piecemeal polypectomy.                        - Return to referring physician as previously                         scheduled. Procedure Code(s):     --- Professional ---                        [phone], Colonoscopy, flexible; with removal of                         tumor(s), polyp(s), or other lesion(s) by snare                         technique                        [id number], 59, Colonoscopy, flexible; with biopsy, single  or multiple Diagnosis Code(s):     --- Professional ---                        Z86.010, Personal history of colonic polyps                        D12.0, Benign neoplasm of cecum                        D12.2, Benign neoplasm of ascending colon                        D12.3, Benign neoplasm of transverse colon (hepatic                         flexure or splenic flexure)                        K64.0, First degree hemorrhoids                        K57.30, Diverticulosis of large intestine without                         perforation or abscess without bleeding CPT copyright 2022 American Medical Association. All rights reserved. The codes documented in this report are preliminary and upon coder review may  be revised to meet current compliance requirements. Eather Colas MD, MD 05/24/2023 10:25:27 AM Number of Addenda: 0 Note Initiated On: 05/24/2023 9:45 AM Scope Withdrawal Time: 0 hours 17 minutes 54 seconds  Total Procedure Duration: 0 hours 24 minutes 53 seconds  Estimated Blood Loss:  Estimated blood loss was minimal.      [facility]

## 2023-05-24 NOTE — Interval H&P Note (Signed)
History and Physical Interval Note:  05/24/2023 9:46 AM  Debra Contreras  has presented today for surgery, with the diagnosis of Z86.010 (ICD-10-CM) - History of adenomatous polyp of colon.  The various methods of treatment have been discussed with the patient and family. After consideration of risks, benefits and other options for treatment, the patient has consented to  Procedure(s) with comments: COLONOSCOPY WITH PROPOFOL (N/A) - DM as a surgical intervention.  The patient's history has been reviewed, patient examined, no change in status, stable for surgery.  I have reviewed the patient's chart and labs.  Questions were answered to the patient's satisfaction.     Regis Bill  Ok to proceed with colonoscopy

## 2023-05-24 NOTE — Anesthesia Postprocedure Evaluation (Signed)
Anesthesia Post Note  Patient: Debra Contreras  Procedure(s) Performed: COLONOSCOPY WITH PROPOFOL POLYPECTOMY HEMOSTASIS CONTROL  Patient location during evaluation: Endoscopy Anesthesia Type: General Level of consciousness: awake and alert Pain management: pain level controlled Vital Signs Assessment: post-procedure vital signs reviewed and stable Respiratory status: spontaneous breathing, nonlabored ventilation, respiratory function stable and patient connected to nasal cannula oxygen Cardiovascular status: blood pressure returned to baseline and stable Postop Assessment: no apparent nausea or vomiting Anesthetic complications: no   There were no known notable events for this encounter.   Last Vitals:  Vitals:   05/24/23 1021 05/24/23 1041  BP: (!) 135/59 (!) 156/90  Pulse: 77   Resp: 19   Temp: 36.4 C   SpO2: 93%     Last Pain:  Vitals:   05/24/23 1041  TempSrc:   PainSc: 0-No pain                 Cleda Mccreedy Marlynn Hinckley

## 2023-05-24 NOTE — Anesthesia Preprocedure Evaluation (Signed)
Anesthesia Evaluation  Patient identified by MRN, date of birth, ID band Patient awake    Reviewed: Allergy & Precautions, NPO status , Patient's Chart, lab work & pertinent test results  History of Anesthesia Complications Negative for: history of anesthetic complications  Airway Mallampati: III  TM Distance: >3 FB Neck ROM: full    Dental  (+) Chipped, Poor Dentition, Missing   Pulmonary neg shortness of breath, sleep apnea    Pulmonary exam normal        Cardiovascular Exercise Tolerance: Good hypertension, (-) angina Normal cardiovascular exam     Neuro/Psych  PSYCHIATRIC DISORDERS       Neuromuscular disease    GI/Hepatic Neg liver ROS, hiatal hernia,GERD  Controlled,,  Endo/Other  diabetes    Renal/GU Renal disease  negative genitourinary   Musculoskeletal   Abdominal   Peds  Hematology negative hematology ROS (+)   Anesthesia Other Findings Past Medical History: 03/09/2015: Acanthosis nigricans 03/09/2015: Allergic rhinitis No date: Allergy 10/142016: Breast lump in female No date: Calculus of kidney No date: GERD (gastroesophageal reflux disease) 03/09/2015: Hiatal hernia 03/09/2015: History of anemia No date: History of kidney stones No date: History of PCOS No date: History of thyroid nodule No date: Hyperlipidemia No date: Hypertension No date: Kidney stones No date: Microalbuminuria     Comment:  DM 05/29/2014: Microalbuminuria 06/04/2015: Morbid obesity (HCC) No date: Pre-diabetes No date: Sleep apnea No date: Snoring No date: Uterus, adenomyosis No date: Vitamin D deficiency 03/09/2015: Vitamin D deficiency  Past Surgical History: 03/2012: ABDOMINAL HYSTERECTOMY 12/2005: BREAST BIOPSY; Right     Comment:  Normal-Cyst 12/2005: BREAST BIOPSY; Right No date: BREAST REDUCTION SURGERY 1990: BREAST SURGERY; Bilateral     Comment:  Reduction 1990: BREAST SURGERY; Bilateral 05/07/2017:  COLONOSCOPY WITH PROPOFOL; N/A     Comment:  Procedure: COLONOSCOPY WITH PROPOFOL;  Surgeon: Wyline Mood, MD;  Location: [facility] ENDOSCOPY;  Service:               Gastroenterology;  Laterality: N/A; 10/14/2020: COLONOSCOPY WITH PROPOFOL; N/A     Comment:  Procedure: COLONOSCOPY WITH PROPOFOL;  Surgeon:               Regis Bill, MD;  Location: [facility] ENDOSCOPY;                Service: Endoscopy;  Laterality: N/A; 03/2011: OOPHORECTOMY; Left No date: REDUCTION MAMMAPLASTY; Bilateral No date: REDUCTION MAMMAPLASTY; Bilateral 09/2016: SLEEVE GASTROPLASTY     Comment:  [place] 10/08/2015: thyroidectomy; Left     Reproductive/Obstetrics negative OB ROS                             Anesthesia Physical Anesthesia Plan  ASA: 3  Anesthesia Plan: General   Post-op Pain Management:    Induction: Intravenous  PONV Risk Score and Plan: Propofol infusion and TIVA  Airway Management Planned: Natural Airway and Nasal Cannula  Additional Equipment:   Intra-op Plan:   Post-operative Plan:   Informed Consent: I have reviewed the patients History and Physical, chart, labs and discussed the procedure including the risks, benefits and alternatives for the proposed anesthesia with the patient or authorized representative who has indicated his/her understanding and acceptance.     Dental Advisory Given  Plan Discussed with: Anesthesiologist, CRNA and Surgeon  Anesthesia Plan Comments: (Patient consented for risks of anesthesia including but not  limited to:  - adverse reactions to medications - risk of airway placement if required - damage to eyes, teeth, lips or other oral mucosa - nerve damage due to positioning  - sore throat or hoarseness - Damage to heart, brain, nerves, lungs, other parts of body or loss of life  Patient voiced understanding and assent.)       Anesthesia Quick Evaluation

## 2023-05-25 ENCOUNTER — Encounter: Payer: Self-pay | Admitting: Gastroenterology

## 2023-05-25 LAB — SURGICAL PATHOLOGY

## 2023-05-27 ENCOUNTER — Encounter: Payer: Self-pay | Admitting: Family Medicine

## 2023-07-27 ENCOUNTER — Other Ambulatory Visit: Payer: Self-pay | Admitting: Family Medicine

## 2023-07-27 DIAGNOSIS — R809 Proteinuria, unspecified: Secondary | ICD-10-CM

## 2023-07-27 DIAGNOSIS — I10 Essential (primary) hypertension: Secondary | ICD-10-CM

## 2023-07-27 NOTE — Telephone Encounter (Signed)
Requested Prescriptions  Pending Prescriptions Disp Refills   lisinopril (ZESTRIL) 10 MG tablet [Pharmacy Med Name: Lisinopril 10 MG Oral Tablet] 90 tablet 0    Sig: Take 1 tablet by mouth once daily     Cardiovascular:  ACE Inhibitors Failed - 07/27/2023  3:32 PM      Failed - Cr in normal range and within 180 days    Creat  Date Value Ref Range Status  12/01/2022 0.69 0.50 - 1.03 mg/dL Final   Creatinine, Urine  Date Value Ref Range Status  12/01/2022 263 20 - 275 mg/dL Final         Failed - K in normal range and within 180 days    Potassium  Date Value Ref Range Status  12/01/2022 4.0 3.5 - 5.3 mmol/L Final         Failed - Last BP in normal range    BP Readings from Last 1 Encounters:  05/24/23 (!) 156/90         Passed - Patient is not pregnant      Passed - Valid encounter within last 6 months    Recent Outpatient Visits           3 months ago Dyslipidemia associated with type 2 diabetes mellitus (HCC)   [facility] Alba Cory, MD   7 months ago Dyslipidemia associated with type 2 diabetes mellitus [facility])   [facility] Alba Cory, MD   7 months ago Well adult exam   [facility] Alba Cory, MD   8 months ago Sore throat   [facility] Della Goo F, FNP   11 months ago Dyslipidemia associated with type 2 diabetes mellitus [facility])   [facility] Alba Cory, MD       Future Appointments             In 2 months Carlynn Purl, Danna Hefty, MD [facility]   In 4 months Alba Cory, MD [facility], [facility]   In 8 months Willeen Niece, MD [facility]

## 2023-08-31 ENCOUNTER — Other Ambulatory Visit: Payer: Self-pay | Admitting: Family Medicine

## 2023-08-31 ENCOUNTER — Ambulatory Visit: Payer: BC Managed Care – PPO | Admitting: Family Medicine

## 2023-08-31 ENCOUNTER — Encounter: Payer: Self-pay | Admitting: Family Medicine

## 2023-08-31 VITALS — BP 130/84 | HR 89 | Temp 98.1°F | Resp 16 | Ht 61.0 in | Wt 260.5 lb

## 2023-08-31 DIAGNOSIS — E1169 Type 2 diabetes mellitus with other specified complication: Secondary | ICD-10-CM | POA: Diagnosis not present

## 2023-08-31 DIAGNOSIS — Z9884 Bariatric surgery status: Secondary | ICD-10-CM

## 2023-08-31 DIAGNOSIS — G4733 Obstructive sleep apnea (adult) (pediatric): Secondary | ICD-10-CM | POA: Diagnosis not present

## 2023-08-31 DIAGNOSIS — I152 Hypertension secondary to endocrine disorders: Secondary | ICD-10-CM

## 2023-08-31 DIAGNOSIS — E785 Hyperlipidemia, unspecified: Secondary | ICD-10-CM

## 2023-08-31 DIAGNOSIS — E890 Postprocedural hypothyroidism: Secondary | ICD-10-CM

## 2023-08-31 DIAGNOSIS — E1159 Type 2 diabetes mellitus with other circulatory complications: Secondary | ICD-10-CM

## 2023-08-31 DIAGNOSIS — E669 Obesity, unspecified: Secondary | ICD-10-CM

## 2023-08-31 DIAGNOSIS — F341 Dysthymic disorder: Secondary | ICD-10-CM

## 2023-08-31 DIAGNOSIS — L210 Seborrhea capitis: Secondary | ICD-10-CM

## 2023-08-31 DIAGNOSIS — I10 Essential (primary) hypertension: Secondary | ICD-10-CM

## 2023-08-31 LAB — POCT GLYCOSYLATED HEMOGLOBIN (HGB A1C): Hemoglobin A1C: 7 % — AB (ref 4.0–5.6)

## 2023-08-31 MED ORDER — ROSUVASTATIN CALCIUM 10 MG PO TABS: 10.0000 mg | ORAL_TABLET | Freq: Every day | ORAL | 0 refills | Status: DC

## 2023-08-31 MED ORDER — TIRZEPATIDE 5 MG/0.5ML ~~LOC~~ SOAJ: 5.0000 mg | SUBCUTANEOUS | 0 refills | Status: DC

## 2023-08-31 MED ORDER — DULOXETINE HCL 60 MG PO CPEP: 60.0000 mg | ORAL_CAPSULE | Freq: Every day | ORAL | 1 refills | Status: DC

## 2023-08-31 MED ORDER — LISINOPRIL 10 MG PO TABS: 10.0000 mg | ORAL_TABLET | Freq: Every day | ORAL | 0 refills | Status: DC

## 2023-08-31 NOTE — Progress Notes (Signed)
Name: Debra Contreras   MRN: [id number]    DOB: [birth year]   Date:08/31/2023       Progress Note  Subjective  Chief Complaint  Chief Complaint  Patient presents with   Medical Management of Chronic Issues   HPI   Thyroid nodule: she was sitting on her desk working on a report and felt a large nodule on the left side back in October 2016, she had a partial thyroidectomy ( removal of left side) in March 2017. Last level was normal and we will continue to check it yearly    DMII with renal manifestation, gingivitis - under the care of dentist , obesity, dyslipidemia.  proteinuria, urine micro was elevated in the past at  50 She denies polyphagia, polydipsia or polyuria A1C today is up from 6.4 % to 7 % She is now taking Rybelsus since Spring 2024, still takes lisinopril and Crestor , last LDL not at goal but it was better down from 207 in 2021 to 106 in April 2024 . 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 Weight went down to 186 lbs ( lowest after bariatric surgery ). Weight was 251.7 lbs, she gained to 261 lbs in early 2023 , it was between  250 -253 lbs for most of 2024 but today is up again at 260 lbs    Seborrhea capitis: improved with topical medication and dietary changes    HTN: she is taking lisinopril , denies chest pain, palpitation or dizziness. BP is slightly up today but it is usually at goal. At home is between 117-137/low  80's   OSA:   Symptoms improved with weight loss, but she has gained it back,  she wakes up feeling tired, she had a colonoscopy done in the Fall and anesthesiologist told her she really needs to resume  CPAP use, we will send her back for a titration study . ESS today was 10.    Dysthymia: mother died 05/17/23and she has been her r nephew's guardian since her mother died, he is now  11 years. 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   History of partial thyroidectomy 10/05/2015   Morbid obesity (HCC) 06/04/2015   Breast lump in female 05/17/2015   Acanthosis nigricans 03/09/2015   Allergic rhinitis, seasonal 03/09/2015   Controlled type 2 diabetes mellitus with microalbuminuria (HCC) 03/09/2015   Dyslipidemia 03/09/2015   Gastro-esophageal reflux disease without esophagitis 03/09/2015   Hiatal hernia 03/09/2015   History of anemia 03/09/2015   H/O: hysterectomy 03/09/2015   Calculus of kidney 03/09/2015   Benign hypertension 03/09/2015   Vitamin D deficiency 03/09/2015   OSA (obstructive sleep apnea) 09/12/2014   Microalbuminuria 05/29/2014    Past Surgical History:  Procedure Laterality Date   ABDOMINAL HYSTERECTOMY  03/2012   BREAST BIOPSY Right 12/2005   Normal-Cyst   BREAST BIOPSY Right 12/2005   BREAST REDUCTION SURGERY     BREAST SURGERY Bilateral 1990   Reduction   BREAST SURGERY Bilateral 1990   COLONOSCOPY WITH PROPOFOL N/A 05/07/2017   Procedure: COLONOSCOPY WITH PROPOFOL;  Surgeon: Wyline Mood, MD;  Location: [facility] ENDOSCOPY;  Service: Gastroenterology;  Laterality: N/A;   COLONOSCOPY WITH PROPOFOL N/A 10/14/2020   Procedure: COLONOSCOPY WITH PROPOFOL;  Surgeon: Regis Bill, MD;  Location: [facility] ENDOSCOPY;  Service: Endoscopy;  Laterality: N/A;   COLONOSCOPY WITH PROPOFOL N/A 05/24/2023   Procedure:  COLONOSCOPY WITH PROPOFOL;  Surgeon: Regis Bill, MD;  Location: [facility] ENDOSCOPY;  Service: Endoscopy;  Laterality: N/A;  DM   HEMOSTASIS CONTROL  05/24/2023   Procedure: HEMOSTASIS CONTROL;  Surgeon: Regis Bill, MD;  Location: [facility] ENDOSCOPY;  Service: Endoscopy;;   OOPHORECTOMY Left 03/2011   POLYPECTOMY  05/24/2023   Procedure: POLYPECTOMY;  Surgeon: Regis Bill, MD;  Location: [facility] ENDOSCOPY;  Service: Endoscopy;;   REDUCTION MAMMAPLASTY Bilateral    REDUCTION MAMMAPLASTY Bilateral    SLEEVE GASTROPLASTY  09/2016   [place]   thyroidectomy Left 10/08/2015    Family History  Problem Relation Age of Onset   Cancer Mother 70       died complications of liver cancer   Hypertension Mother    Cirrhosis Mother    COPD Mother        Heavy Smoker   Hepatitis C Mother    Alcohol abuse Father    Heart disease Father    Heart attack Father    Diabetes Brother    Obesity Brother    Lung cancer Maternal Grandmother        Snuff   Diabetes Maternal Grandmother    Alcoholism Maternal Grandmother    Alcoholism Maternal Grandfather    Diabetes Maternal Grandfather    Prostate cancer Paternal Grandfather    Cancer Maternal Uncle        colon cancer   Cancer Cousin 61       colon cancer   Breast cancer Cousin     Social History   Tobacco Use   Smoking status: Never   Smokeless tobacco: Never  Substance Use Topics   Alcohol use: Yes    Alcohol/week: 0.0 standard drinks of alcohol    Comment: wine-social drinker     Current Outpatient Medications:    Biotin 5000 MCG TABS, Take 5,000 mg by mouth 3 (three) times a week., Disp: , Rfl:    Cholecalciferol (VITAMIN D) 2000 units CAPS, Take 1 capsule (2,000 Units total) by mouth daily., Disp: 30 capsule, Rfl:    clobetasol (TEMOVATE) 0.05 % external solution, Apply 1 Application topically 2 (two) times daily., Disp: 100 mL, Rfl: 0   DULoxetine (CYMBALTA) 60 MG capsule, Take 1 capsule (60 mg total) by mouth daily.,  Disp: 90 capsule, Rfl: 1   ELDERBERRY PO, Take by mouth., Disp: , Rfl:    Flaxseed, Linseed, (FLAXSEED OIL PO), Take by mouth., Disp: , Rfl:    fluticasone (FLONASE) 50 MCG/ACT nasal spray, Place 2 sprays into both nostrils daily., Disp: 16 g, Rfl: 6   hydrocortisone cream 1 %, Apply 1 application topically as needed for itching., Disp: , Rfl:    ketoconazole (NIZORAL) 2 % shampoo, Apply 1 Application topically 2 (two) times a week., Disp: 120 mL, Rfl: 0   lisinopril (ZESTRIL) 10 MG  tablet, Take 1 tablet by mouth once daily, Disp: 90 tablet, Rfl: 0   loratadine (CLARITIN) 10 MG tablet, Take by mouth., Disp: , Rfl:    Multiple Vitamin (MULTIVITAMIN) tablet, Take 1 tablet by mouth daily., Disp: , Rfl:    rosuvastatin (CRESTOR) 10 MG tablet, Take 1 tablet (10 mg total) by mouth daily., Disp: 90 tablet, Rfl: 0   Selenium Sulfide 2.3 % SHAM, Apply 1 each topically 2 (two) times a week., Disp: 180 mL, Rfl: 2   Thiamine HCl (VITAMIN B-1 PO), Take 500 mg by mouth daily., Disp: , Rfl:    Turmeric 500 MG CAPS, Take by mouth., Disp: , Rfl:    vitamin B-12 (CYANOCOBALAMIN) 1000 MCG tablet, Take 1,000 mcg by mouth 2 (two) times a week., Disp: , Rfl:    vitamin C (ASCORBIC ACID) 500 MG tablet, Take 500 mg by mouth daily., Disp: , Rfl:   Allergies  Allergen Reactions   Zinc     Nausea, vomiting and diarrhea    Latex Rash    I personally reviewed active problem list, medication list, allergies, family history with the patient/caregiver today.   ROS  Ten systems reviewed and is negative except as mentioned in HPI    Objective  Vitals:   08/31/23 0813  BP: 130/84  Pulse: 89  Resp: 16  Temp: 98.1 F (36.7 C)  TempSrc: Oral  SpO2: 96%  Weight: 260 lb 8 oz (118.2 kg)  Height: 5\' 1"  (1.549 m)    Body mass index is 49.22 kg/m.  Physical Exam  Constitutional: Patient appears well-developed and well-nourished. Obese  No distress.  HEENT: head atraumatic, normocephalic, pupils equal and  reactive to light, , neck supple Cardiovascular: Normal rate, regular rhythm and normal heart sounds.  No murmur heard. No BLE edema. Pulmonary/Chest: Effort normal and breath sounds normal. No respiratory distress. Abdominal: Soft.  There is no tenderness. Psychiatric: Patient has a normal mood and affect. behavior is normal. Judgment and thought content normal.   Recent Results (from the past 2160 hours)  POCT glycosylated hemoglobin (Hb A1C)     Status: Abnormal   Collection Time: 08/31/23  8:21 AM  Result Value Ref Range   Hemoglobin A1C 7.0 (A) 4.0 - 5.6 %   HbA1c POC (<> result, manual entry)     HbA1c, POC (prediabetic range)     HbA1c, POC (controlled diabetic range)      Diabetic Foot Exam:     PHQ2/9:    08/31/2023    8:12 AM 04/08/2023    8:03 AM 12/22/2022    7:49 AM 12/01/2022    7:38 AM 11/04/2022    1:45 PM  Depression screen PHQ 2/9  Decreased Interest 0 0 0 0 0  Down, Depressed, Hopeless 0 0 0 0 0  PHQ - 2 Score 0 0 0 0 0  Altered sleeping 0 0 0 0   Tired, decreased energy 0 0 0 0   Change in appetite 0 0 0 0   Feeling bad or failure about yourself  0 0 0 0   Trouble concentrating 0 0 0 0   Moving slowly or fidgety/restless 0 0 0 0   Suicidal thoughts 0 0 0 0   PHQ-9 Score 0 0 0 0   Difficult doing work/chores Not difficult at all        phq 9 is negative  Fall Risk:    08/31/2023    8:08 AM 04/08/2023    8:03 AM 12/22/2022  7:49 AM 12/01/2022    7:38 AM 11/04/2022    1:45 PM  Fall Risk   Falls in the past year? 0 0 1 1 1   Number falls in past yr: 0 0 0 0 1  Injury with Fall? 0 0 0 0 0  Risk for fall due to : No Fall Risks No Fall Risks No Fall Risks No Fall Risks   Follow up Falls prevention discussed;Education provided;Falls evaluation completed Falls prevention discussed Falls prevention discussed Falls prevention discussed      Assessment & Plan  1. Dyslipidemia associated with type 2 diabetes mellitus (HCC) (Primary)  - POCT glycosylated  hemoglobin (Hb A1C) - HM Diabetes Foot Exam - tirzepatide (MOUNJARO) 5 MG/0.5ML Pen; Inject 5 mg into the skin once a week.  Dispense: 2 mL; Refill: 0 - rosuvastatin (CRESTOR) 10 MG tablet; Take 1 tablet (10 mg total) by mouth daily.  Dispense: 90 tablet; Refill: 0  2. Morbid obesity (HCC)  Discussed with the patient the risk posed by an increased BMI. Discussed importance of portion control, calorie counting and at least 150 minutes of physical activity weekly. Avoid sweet beverages and drink more water. Eat at least 6 servings of fruit and vegetables daily    3. Obesity, diabetes, and hypertension syndrome (HCC)  Continue ACE, statin therapy and switching from Rybelsus to [facility]  4. OSA (obstructive sleep apnea)  Referral sleep study  5. History of bariatric surgery  Gaining weight again   6. History of partial thyroidectomy  Recheck TSH next visit   7. Benign hypertension  - lisinopril (ZESTRIL) 10 MG tablet; Take 1 tablet (10 mg total) by mouth daily.  Dispense: 90 tablet; Refill: 0  8. Dysthymia  - DULoxetine (CYMBALTA) 60 MG capsule; Take 1 capsule (60 mg total) by mouth daily.  Dispense: 90 capsule; Refill: 1  9. Seborrhea capitis in adult

## 2023-09-25 ENCOUNTER — Other Ambulatory Visit: Payer: Self-pay | Admitting: Family Medicine

## 2023-09-25 DIAGNOSIS — E1169 Type 2 diabetes mellitus with other specified complication: Secondary | ICD-10-CM

## 2023-09-28 ENCOUNTER — Other Ambulatory Visit: Payer: Self-pay | Admitting: Family Medicine

## 2023-09-28 MED ORDER — TIRZEPATIDE 7.5 MG/0.5ML ~~LOC~~ SOAJ: 7.5000 mg | SUBCUTANEOUS | 0 refills | Status: DC

## 2023-09-28 NOTE — Telephone Encounter (Signed)
Called patient no answer left detailed vm

## 2023-09-30 ENCOUNTER — Ambulatory Visit: Payer: BC Managed Care – PPO | Attending: Otolaryngology

## 2023-09-30 DIAGNOSIS — K449 Diaphragmatic hernia without obstruction or gangrene: Secondary | ICD-10-CM | POA: Diagnosis not present

## 2023-09-30 DIAGNOSIS — K219 Gastro-esophageal reflux disease without esophagitis: Secondary | ICD-10-CM | POA: Diagnosis not present

## 2023-09-30 DIAGNOSIS — D649 Anemia, unspecified: Secondary | ICD-10-CM | POA: Diagnosis not present

## 2023-09-30 DIAGNOSIS — R0683 Snoring: Secondary | ICD-10-CM | POA: Insufficient documentation

## 2023-09-30 DIAGNOSIS — G4733 Obstructive sleep apnea (adult) (pediatric): Secondary | ICD-10-CM | POA: Insufficient documentation

## 2023-09-30 DIAGNOSIS — I10 Essential (primary) hypertension: Secondary | ICD-10-CM | POA: Insufficient documentation

## 2023-09-30 DIAGNOSIS — Z9884 Bariatric surgery status: Secondary | ICD-10-CM | POA: Insufficient documentation

## 2023-10-06 ENCOUNTER — Ambulatory Visit: Payer: BC Managed Care – PPO | Admitting: Family Medicine

## 2023-10-25 ENCOUNTER — Telehealth: Payer: Self-pay

## 2023-10-25 NOTE — Telephone Encounter (Signed)
 Pa done through cover my meds waiting on insurance to determine

## 2023-10-25 NOTE — Telephone Encounter (Signed)
 Prior Auth on   tirzepatide [facility]) 7.5 MG/0.5ML Pen    Key [id number]

## 2023-11-01 ENCOUNTER — Telehealth: Payer: Self-pay

## 2023-11-01 NOTE — Telephone Encounter (Signed)
 Spoke to patient and advised the CPAP machine looks like its being scheduled to be delivered

## 2023-11-01 NOTE — Telephone Encounter (Signed)
Have you seen anything?

## 2023-11-01 NOTE — Telephone Encounter (Signed)
 Copied from CRM [id number]. Topic: Clinical - Medical Advice >> Nov 01, 2023  2:23 PM Debra Contreras wrote: Reason for CRM: The patient has called to follow up on the status of their request for an order for a CPAP machine after the conclusion of their sleep study   The patient shares that their sleep study was conducted a little more than 1 month ago on 09/30/23 and they would like to know what equipment provider they can anticipate contact from   Please contact when possible

## 2023-11-01 NOTE — Telephone Encounter (Signed)
 Copied from CRM [id number]. Topic: Clinical - Medication Question >> Nov 01, 2023  2:26 PM Everette C wrote: Reason for CRM: The patient has called for an update on the status of their prior authorization request for tirzepatide [facility]) 7.5 MG/0.5ML Pen [[id number]]  Please contact when possible

## 2023-11-01 NOTE — Telephone Encounter (Signed)
 PA was done and sent through cover my meds 7 days ago. Have not received any response. Pt is aware to call insurance

## 2023-11-03 ENCOUNTER — Telehealth: Payer: Self-pay

## 2023-11-03 DIAGNOSIS — G4733 Obstructive sleep apnea (adult) (pediatric): Secondary | ICD-10-CM

## 2023-11-03 NOTE — Telephone Encounter (Signed)
 Copied from CRM [id number]. Topic: General - Other >> Nov 03, 2023  3:55 PM Debra Contreras wrote: Reason for CRM: Pt called today and highly upset about not having her cpap machine and she is wanting the order for the cpap machine so she can go find one her self.  Can you please give her a call regarding this matter.

## 2023-11-03 NOTE — Telephone Encounter (Signed)
 Pt states she can find a cheaper machine somewhere else, please sign DME order and will pick it up tomorrow morning.

## 2023-11-28 ENCOUNTER — Other Ambulatory Visit: Payer: Self-pay | Admitting: Family Medicine

## 2023-11-28 DIAGNOSIS — E1169 Type 2 diabetes mellitus with other specified complication: Secondary | ICD-10-CM

## 2023-12-03 ENCOUNTER — Encounter: Payer: Self-pay | Admitting: Family Medicine

## 2023-12-03 ENCOUNTER — Ambulatory Visit: Payer: Self-pay | Admitting: Family Medicine

## 2023-12-03 VITALS — BP 132/78 | HR 92 | Resp 16 | Ht 61.0 in | Wt 254.5 lb

## 2023-12-03 DIAGNOSIS — E1159 Type 2 diabetes mellitus with other circulatory complications: Secondary | ICD-10-CM | POA: Diagnosis not present

## 2023-12-03 DIAGNOSIS — Z13820 Encounter for screening for osteoporosis: Secondary | ICD-10-CM

## 2023-12-03 DIAGNOSIS — Z8249 Family history of ischemic heart disease and other diseases of the circulatory system: Secondary | ICD-10-CM | POA: Diagnosis not present

## 2023-12-03 DIAGNOSIS — I152 Hypertension secondary to endocrine disorders: Secondary | ICD-10-CM | POA: Diagnosis not present

## 2023-12-03 DIAGNOSIS — Z1231 Encounter for screening mammogram for malignant neoplasm of breast: Secondary | ICD-10-CM

## 2023-12-03 DIAGNOSIS — E669 Obesity, unspecified: Secondary | ICD-10-CM

## 2023-12-03 DIAGNOSIS — Z0000 Encounter for general adult medical examination without abnormal findings: Secondary | ICD-10-CM

## 2023-12-03 DIAGNOSIS — Z23 Encounter for immunization: Secondary | ICD-10-CM | POA: Diagnosis not present

## 2023-12-03 DIAGNOSIS — Z0001 Encounter for general adult medical examination with abnormal findings: Secondary | ICD-10-CM | POA: Diagnosis not present

## 2023-12-03 DIAGNOSIS — E1169 Type 2 diabetes mellitus with other specified complication: Secondary | ICD-10-CM

## 2023-12-03 NOTE — Progress Notes (Signed)
 Name: Debra Contreras    MRN: [id number]    DOB: 1966-07-10   Date:12/03/2023       Progress Note  Subjective  Chief Complaint  Chief Complaint  Patient presents with   Annual Exam    HPI  Patient presents for annual CPE.  Diet: drinking more water, adding fiber, smaller portions  Exercise: discussed regular activity   Last Eye Exam: completed Last Dental Exam: completed  Flowsheet Row Office Visit from 12/03/2023 in [facility]  AUDIT-C Score 1      Depression: Phq 9 is  negative    12/03/2023    7:50 AM 08/31/2023    8:12 AM 04/08/2023    8:03 AM 12/22/2022    7:49 AM 12/01/2022    7:38 AM  Depression screen PHQ 2/9  Decreased Interest 0 0 0 0 0  Down, Depressed, Hopeless 0 0 0 0 0  PHQ - 2 Score 0 0 0 0 0  Altered sleeping 0 0 0 0 0  Tired, decreased energy 0 0 0 0 0  Change in appetite 0 0 0 0 0  Feeling bad or failure about yourself  0 0 0 0 0  Trouble concentrating 0 0 0 0 0  Moving slowly or fidgety/restless 0 0 0 0 0  Suicidal thoughts 0 0 0 0 0  PHQ-9 Score 0 0 0 0 0  Difficult doing work/chores Not difficult at all Not difficult at all      Hypertension: BP Readings from Last 3 Encounters:  12/03/23 132/78  08/31/23 130/84  05/24/23 (!) 156/90   Obesity: Wt Readings from Last 3 Encounters:  12/03/23 254 lb 8 oz (115.4 kg)  08/31/23 260 lb 8 oz (118.2 kg)  05/24/23 251 lb (113.9 kg)   BMI Readings from Last 3 Encounters:  12/03/23 48.09 kg/m  08/31/23 49.22 kg/m  05/24/23 47.43 kg/m     Vaccines: reviewed with the patient. Tdap today   Hep C Screening: completed STD testing and prevention (HIV/chl/gon/syphilis): N/A Intimate partner violence: negative screen  Sexual History : not sexually active  Menstrual History/LMP/Abnormal Bleeding: s/p hysterectomy  Discussed importance of follow up if any post-menopausal bleeding: not applicable  Incontinence Symptoms: negative for symptoms   Breast cancer:  - Last  Mammogram: she will schedule it  - BRCA gene screening:   Osteoporosis Prevention : Discussed high calcium  and vitamin D  supplementation, weight bearing exercises Bone density :yes - she will check coverage with insurance  Cervical cancer screening: not applicable due to hysterectomy  Skin cancer: Discussed monitoring for atypical lesions  Colorectal cancer: due for repeat, she will contact Dr. Emerick Hanlon   Lung cancer:  Low Dose CT Chest recommended if Age 60-80 years, 20 pack-year currently smoking OR have quit w/in 15years. Patient does not qualify for screen   ECG: 2025  Advanced Care Planning: A voluntary discussion about advance care planning including the explanation and discussion of advance directives.  Discussed health care proxy and Living will, and the patient was able to identify a health care proxy as sister - Debra Contreras .  Patient does not have a living will and power of attorney of health care   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   History of partial thyroidectomy 10/05/2015   Morbid  obesity (HCC) 06/04/2015   Breast lump in female 05/17/2015   Acanthosis nigricans 03/09/2015   Allergic rhinitis, seasonal 03/09/2015   Controlled type 2 diabetes mellitus with microalbuminuria (HCC) 03/09/2015   Dyslipidemia 03/09/2015   Gastro-esophageal reflux disease without esophagitis 03/09/2015   Hiatal hernia 03/09/2015   History of anemia 03/09/2015   H/O: hysterectomy 03/09/2015   Calculus of kidney 03/09/2015   Benign hypertension 03/09/2015   Vitamin D  deficiency 03/09/2015   OSA (obstructive sleep apnea) 09/12/2014   Microalbuminuria 05/29/2014    Past Surgical History:  Procedure Laterality Date   ABDOMINAL HYSTERECTOMY  03/2012   BREAST BIOPSY  Right 12/2005   Normal-Cyst   BREAST BIOPSY Right 12/2005   BREAST REDUCTION SURGERY     BREAST SURGERY Bilateral 1990   Reduction   BREAST SURGERY Bilateral 1990   COLONOSCOPY WITH PROPOFOL  N/A 05/07/2017   Procedure: COLONOSCOPY WITH PROPOFOL ;  Surgeon: Luke Salaam, MD;  Location: [facility] ENDOSCOPY;  Service: Gastroenterology;  Laterality: N/A;   COLONOSCOPY WITH PROPOFOL  N/A 10/14/2020   Procedure: COLONOSCOPY WITH PROPOFOL ;  Surgeon: Shane Darling, MD;  Location: [facility] ENDOSCOPY;  Service: Endoscopy;  Laterality: N/A;   COLONOSCOPY WITH PROPOFOL  N/A 05/24/2023   Procedure: COLONOSCOPY WITH PROPOFOL ;  Surgeon: Shane Darling, MD;  Location: [facility] ENDOSCOPY;  Service: Endoscopy;  Laterality: N/A;  DM   HEMOSTASIS CONTROL  05/24/2023   Procedure: HEMOSTASIS CONTROL;  Surgeon: Shane Darling, MD;  Location: [facility] ENDOSCOPY;  Service: Endoscopy;;   OOPHORECTOMY Left 03/2011   POLYPECTOMY  05/24/2023   Procedure: POLYPECTOMY;  Surgeon: Shane Darling, MD;  Location: [facility] ENDOSCOPY;  Service: Endoscopy;;   REDUCTION MAMMAPLASTY Bilateral    REDUCTION MAMMAPLASTY Bilateral    SLEEVE GASTROPLASTY  09/2016   [place]   thyroidectomy Left 10/08/2015    Family History  Problem Relation Age of Onset   Cancer Mother 28       died complications of liver cancer   Hypertension Mother    Cirrhosis Mother    COPD Mother        Heavy Smoker   Hepatitis C Mother    Alcohol abuse Father    Heart disease Father    Heart attack Father    Diabetes Brother    Obesity Brother    Lung cancer Maternal Grandmother        Snuff   Diabetes Maternal Grandmother    Alcoholism Maternal Grandmother    Alcoholism Maternal Grandfather    Diabetes Maternal Grandfather    Prostate cancer Paternal Grandfather    Cancer Maternal Uncle        colon cancer   Cancer Cousin 54       colon cancer   Breast cancer Cousin     Social History   Socioeconomic History   Marital status: Divorced     Spouse name: Not on file   Number of children: 0   Years of education: Not on file   Highest education level: Bachelor's degree (e.g., BA, AB, BS)  Occupational History   Occupation: Optometrist   Tobacco Use   Smoking status: Never   Smokeless tobacco: Never  Vaping Use   Vaping status: Never Used  Substance and Sexual Activity   Alcohol use: Yes    Alcohol/week: 0.0 standard drinks of alcohol    Comment: wine-social drinker   Drug use: No   Sexual activity: Not Currently    Partners: Male  Other Topics Concern   Not on file  Social  History Narrative   Her grown up niece moved in with her May 2020    She is a Optometrist.    Social Drivers of Corporate investment banker Strain: Low Risk  (12/03/2023)   Overall Financial Resource Strain (CARDIA)    Difficulty of Paying Living Expenses: Not hard at all  Food Insecurity: No Food Insecurity (12/03/2023)   Hunger Vital Sign    Worried About Running Out of Food in the Last Year: Never true    Ran Out of Food in the Last Year: Never true  Transportation Needs: No Transportation Needs (12/03/2023)   PRAPARE - Administrator, Civil Service (Medical): No    Lack of Transportation (Non-Medical): No  Physical Activity: Insufficiently Active (12/03/2023)   Exercise Vital Sign    Days of Exercise per Week: 3 days    Minutes of Exercise per Session: 20 min  Stress: Stress Concern Present (12/03/2023)   [employer] of Occupational Health - Occupational Stress Questionnaire    Feeling of Stress : Very much  Social Connections: Moderately Integrated (12/03/2023)   Social Connection and Isolation Panel [NHANES]    Frequency of Communication with Friends and Family: More than three times a week    Frequency of Social Gatherings with Friends and Family: Twice a week    Attends Religious Services: More than 4 times per year    Active Member of [employer] or Organizations: Yes    Attends Engineer, structural: More than 4  times per year    Marital Status: Divorced  Intimate Partner Violence: Not At Risk (12/03/2023)   Humiliation, Afraid, Rape, and Kick questionnaire    Fear of Current or Ex-Partner: No    Emotionally Abused: No    Physically Abused: No    Sexually Abused: No     Current Outpatient Medications:    Biotin 5000 MCG TABS, Take 5,000 mg by mouth 3 (three) times a week., Disp: , Rfl:    Cholecalciferol (VITAMIN D ) 2000 units CAPS, Take 1 capsule (2,000 Units total) by mouth daily., Disp: 30 capsule, Rfl:    clobetasol  (TEMOVATE ) 0.05 % external solution, Apply 1 Application topically 2 (two) times daily., Disp: 100 mL, Rfl: 0   DULoxetine  (CYMBALTA ) 60 MG capsule, Take 1 capsule (60 mg total) by mouth daily., Disp: 90 capsule, Rfl: 1   ELDERBERRY PO, Take by mouth., Disp: , Rfl:    Flaxseed, Linseed, (FLAXSEED OIL PO), Take by mouth., Disp: , Rfl:    fluticasone  (FLONASE ) 50 MCG/ACT nasal spray, Place 2 sprays into both nostrils daily., Disp: 16 g, Rfl: 6   hydrocortisone  cream 1 %, Apply 1 application topically as needed for itching., Disp: , Rfl:    ketoconazole  (NIZORAL ) 2 % shampoo, Apply 1 Application topically 2 (two) times a week., Disp: 120 mL, Rfl: 0   lisinopril  (ZESTRIL ) 10 MG tablet, Take 1 tablet (10 mg total) by mouth daily., Disp: 90 tablet, Rfl: 0   loratadine (CLARITIN) 10 MG tablet, Take by mouth., Disp: , Rfl:    Multiple Vitamin (MULTIVITAMIN) tablet, Take 1 tablet by mouth daily., Disp: , Rfl:    rosuvastatin  (CRESTOR ) 10 MG tablet, Take 1 tablet by mouth once daily, Disp: 30 tablet, Rfl: 0   Selenium  Sulfide 2.3 % SHAM, Apply 1 each topically 2 (two) times a week., Disp: 180 mL, Rfl: 2   Thiamine  HCl (VITAMIN B-1 PO), Take 500 mg by mouth daily., Disp: , Rfl:    tirzepatide  (MOUNJARO )  7.5 MG/0.5ML Pen, Inject 7.5 mg into the skin once a week., Disp: 6 mL, Rfl: 0   Turmeric 500 MG CAPS, Take by mouth., Disp: , Rfl:    vitamin B-12 (CYANOCOBALAMIN ) 1000 MCG tablet, Take 1,000  mcg by mouth 2 (two) times a week., Disp: , Rfl:    vitamin C (ASCORBIC ACID) 500 MG tablet, Take 500 mg by mouth daily., Disp: , Rfl:   Allergies  Allergen Reactions   Zinc     Nausea, vomiting and diarrhea    Latex Rash     ROS  Constitutional: Negative for fever or weight change.  Respiratory: Negative for cough and shortness of breath.   Cardiovascular: Negative for chest pain or palpitations.  Gastrointestinal: Negative for abdominal pain, no bowel changes.  Musculoskeletal: Negative for gait problem or joint swelling.  Skin: Negative for rash.  Neurological: Negative for dizziness or headache.  No other specific complaints in a complete review of systems (except as listed in HPI above).   Objective  Vitals:   12/03/23 0757  BP: 132/78  Pulse: 92  Resp: 16  SpO2: 99%  Weight: 254 lb 8 oz (115.4 kg)  Height: 5\' 1"  (1.549 m)    Body mass index is 48.09 kg/m.  Physical Exam  Constitutional: Patient appears well-developed and well-nourished. No distress.  HENT: Head: Normocephalic and atraumatic. Ears: B TMs ok, no erythema or effusion; Nose: Nose normal. Mouth/Throat: Oropharynx is clear and moist. No oropharyngeal exudate.  Eyes: Conjunctivae and EOM are normal. Pupils are equal, round, and reactive to light. No scleral icterus.  Neck: Normal range of motion. Neck supple. No JVD present. No thyromegaly present.  Cardiovascular: Normal rate, regular rhythm and normal heart sounds.  No murmur heard. No BLE edema. Pulmonary/Chest: Effort normal and breath sounds normal. No respiratory distress. Abdominal: Soft. Bowel sounds are normal, no distension. There is no tenderness. no masses Breast: no lumps or masses, no nipple discharge or rashes FEMALE GENITALIA:  Not done  RECTAL: not done  Musculoskeletal: Normal range of motion, no joint effusions. No gross deformities Neurological: he is alert and oriented to person, place, and time. No cranial nerve deficit.  Coordination, balance, strength, speech and gait are normal.  Skin: Skin is warm and dry. No rash noted. No erythema.  Psychiatric: Patient has a normal mood and affect. behavior is normal. Judgment and thought content normal.     Assessment & Plan  1. Well adult exam (Primary)  - MM 3D SCREENING MAMMOGRAM BILATERAL BREAST; Future - Lipid panel - Microalbumin / creatinine urine ratio - CBC with Differential/Platelet - Comprehensive metabolic panel with GFR - Hemoglobin A1c - DG Bone Density; Future  2. Encounter for screening mammogram for malignant neoplasm of breast  She will call to schedule mammogram  3. Obesity, diabetes, and hypertension syndrome (HCC)  - Lipid panel - Microalbumin / creatinine urine ratio - Comprehensive metabolic panel with GFR - Hemoglobin A1c - CT CORONARY MORPH W/CTA COR W/SCORE W/CA W/CM &/OR WO/CM; Future  4. Need for Tdap vaccination  - Tdap vaccine greater than or equal to 7yo IM  5. Osteoporosis screening  - DG Bone Density; Future  6. Family history of heart disease  - CT CORONARY MORPH W/CTA COR W/SCORE W/CA W/CM &/OR WO/CM; Future    -USPSTF grade A and B recommendations reviewed with patient; age-appropriate recommendations, preventive care, screening tests, etc discussed and encouraged; healthy living encouraged; see AVS for patient education given to patient -Discussed importance of 150  minutes of physical activity weekly, eat two servings of fish weekly, eat one serving of tree nuts ( cashews, pistachios, pecans, almonds.Aaron Aas) every other day, eat 6 servings of fruit/vegetables daily and drink plenty of water and avoid sweet beverages.   -Reviewed Health Maintenance: Yes.

## 2023-12-04 LAB — COMPREHENSIVE METABOLIC PANEL WITH GFR
AG Ratio: 1.7 (calc) (ref 1.0–2.5)
ALT: 30 U/L — ABNORMAL HIGH (ref 6–29)
AST: 24 U/L (ref 10–35)
Albumin: 4.7 g/dL (ref 3.6–5.1)
Alkaline phosphatase (APISO): 53 U/L (ref 37–153)
BUN: 12 mg/dL (ref 7–25)
CO2: 27 mmol/L (ref 20–32)
Calcium: 10 mg/dL (ref 8.6–10.4)
Chloride: 104 mmol/L (ref 98–110)
Creat: 0.73 mg/dL (ref 0.50–1.03)
Globulin: 2.8 g/dL (ref 1.9–3.7)
Glucose, Bld: 94 mg/dL (ref 65–99)
Potassium: 4.1 mmol/L (ref 3.5–5.3)
Sodium: 140 mmol/L (ref 135–146)
Total Bilirubin: 0.4 mg/dL (ref 0.2–1.2)
Total Protein: 7.5 g/dL (ref 6.1–8.1)
eGFR: 96 mL/min/{1.73_m2} (ref >=60)

## 2023-12-04 LAB — CBC WITH DIFFERENTIAL/PLATELET
Absolute Lymphocytes: 2722 {cells}/uL (ref 850–3900)
Absolute Monocytes: 310 {cells}/uL (ref 200–950)
Basophils Absolute: 22 {cells}/uL (ref 0–200)
Basophils Relative: 0.5 %
Eosinophils Absolute: 90 {cells}/uL (ref 15–500)
Eosinophils Relative: 2.1 %
HCT: 42.6 % (ref 35.0–45.0)
Hemoglobin: 13.9 g/dL (ref 11.7–15.5)
MCH: 28.7 pg (ref 27.0–33.0)
MCHC: 32.6 g/dL (ref 32.0–36.0)
MCV: 88 fL (ref 80.0–100.0)
MPV: 10.9 fL (ref 7.5–12.5)
Monocytes Relative: 7.2 %
Neutro Abs: 1157 {cells}/uL — ABNORMAL LOW (ref 1500–7800)
Neutrophils Relative %: 26.9 %
Platelets: 325 10*3/uL (ref 140–400)
RBC: 4.84 10*6/uL (ref 3.80–5.10)
RDW: 13 % (ref 11.0–15.0)
Total Lymphocyte: 63.3 %
WBC: 4.3 10*3/uL (ref 3.8–10.8)

## 2023-12-04 LAB — MICROALBUMIN / CREATININE URINE RATIO
Creatinine, Urine: 246 mg/dL (ref 20–275)
Microalb Creat Ratio: 7 mg/g{creat} (ref <30)
Microalb, Ur: 1.8 mg/dL

## 2023-12-04 LAB — HEMOGLOBIN A1C
Hgb A1c MFr Bld: 6.2 % — ABNORMAL HIGH (ref <5.7)
Mean Plasma Glucose: 131 mg/dL
eAG (mmol/L): 7.3 mmol/L

## 2023-12-04 LAB — LIPID PANEL
Cholesterol: 177 mg/dL (ref <200)
HDL: 46 mg/dL — ABNORMAL LOW (ref >=50)
LDL Cholesterol (Calc): 98 mg/dL
Non-HDL Cholesterol (Calc): 131 mg/dL — ABNORMAL HIGH (ref <130)
Total CHOL/HDL Ratio: 3.8 (calc) (ref <5.0)
Triglycerides: 210 mg/dL — ABNORMAL HIGH (ref <150)

## 2023-12-06 ENCOUNTER — Encounter: Payer: Self-pay | Admitting: Family Medicine

## 2023-12-23 ENCOUNTER — Other Ambulatory Visit: Payer: Self-pay | Admitting: Family Medicine

## 2023-12-26 ENCOUNTER — Other Ambulatory Visit: Payer: Self-pay | Admitting: Family Medicine

## 2023-12-26 DIAGNOSIS — E1169 Type 2 diabetes mellitus with other specified complication: Secondary | ICD-10-CM

## 2023-12-29 ENCOUNTER — Encounter: Payer: Self-pay | Admitting: Family Medicine

## 2023-12-29 ENCOUNTER — Ambulatory Visit: Payer: Self-pay | Admitting: Family Medicine

## 2023-12-29 VITALS — BP 124/76 | HR 95 | Resp 16 | Ht 61.0 in | Wt 253.7 lb

## 2023-12-29 DIAGNOSIS — F341 Dysthymic disorder: Secondary | ICD-10-CM | POA: Diagnosis not present

## 2023-12-29 DIAGNOSIS — E559 Vitamin D deficiency, unspecified: Secondary | ICD-10-CM

## 2023-12-29 DIAGNOSIS — E1159 Type 2 diabetes mellitus with other circulatory complications: Secondary | ICD-10-CM | POA: Diagnosis not present

## 2023-12-29 DIAGNOSIS — E785 Hyperlipidemia, unspecified: Secondary | ICD-10-CM

## 2023-12-29 DIAGNOSIS — I152 Hypertension secondary to endocrine disorders: Secondary | ICD-10-CM

## 2023-12-29 DIAGNOSIS — E1169 Type 2 diabetes mellitus with other specified complication: Secondary | ICD-10-CM

## 2023-12-29 DIAGNOSIS — E890 Postprocedural hypothyroidism: Secondary | ICD-10-CM

## 2023-12-29 DIAGNOSIS — E669 Obesity, unspecified: Secondary | ICD-10-CM

## 2023-12-29 DIAGNOSIS — Z9884 Bariatric surgery status: Secondary | ICD-10-CM

## 2023-12-29 DIAGNOSIS — K219 Gastro-esophageal reflux disease without esophagitis: Secondary | ICD-10-CM

## 2023-12-29 DIAGNOSIS — G4733 Obstructive sleep apnea (adult) (pediatric): Secondary | ICD-10-CM

## 2023-12-29 DIAGNOSIS — E538 Deficiency of other specified B group vitamins: Secondary | ICD-10-CM

## 2023-12-29 DIAGNOSIS — E519 Thiamine deficiency, unspecified: Secondary | ICD-10-CM

## 2023-12-29 MED ORDER — LISINOPRIL 10 MG PO TABS: 10.0000 mg | ORAL_TABLET | Freq: Every day | ORAL | 1 refills | Status: DC

## 2023-12-29 MED ORDER — ROSUVASTATIN CALCIUM 10 MG PO TABS: 10.0000 mg | ORAL_TABLET | Freq: Every day | ORAL | 1 refills | Status: DC

## 2023-12-29 MED ORDER — TIRZEPATIDE 10 MG/0.5ML ~~LOC~~ SOAJ
10.0000 mg | SUBCUTANEOUS | 0 refills | Status: DC
Start: 2023-12-29 — End: 2024-04-04

## 2023-12-29 NOTE — Progress Notes (Signed)
 Name: Debra Contreras    MRN: [id number]    DOB: [birth year]   Date:12/29/2023       Progress Note  Subjective  Chief Complaint  Chief Complaint  Patient presents with   Medical Management of Chronic Issues   Discussed the use of AI scribe software for clinical note transcription with the patient, who gave verbal consent to proceed.  History of Present Illness Debra Contreras  is a 58 year old female with diabetes and dyslipidemia who presents for a four-month follow-up.  She is here for a four-month follow-up primarily to review her diabetes management. Her hemoglobin A1c has improved from 7% in January to 6.2% in May. Her fasting blood sugar was 94 mg/dL, and her kidney function tests, including GFR and microalbumin, were normal. She is currently taking Mounjaro  7.5 mg and has no issues with her diabetes medications. No symptoms of hyperglycemia such as excessive hunger, thirst, or frequent urination.  She has a history of dyslipidemia, with her LDL cholesterol improving from 106 mg/dL to 98 mg/dL, though it remains above the target of 70 mg/dL. Her triglycerides have decreased from 347 mg/dL to 409 mg/dL, and her HDL cholesterol is stable at 46 mg/dL. She is on rosuvastatin  for dyslipidemia and lisinopril  for blood pressure and kidney protection. She prefers to maintain her current dose of rosuvastatin  while adjusting her diet to further improve her lipid profile.  Her blood pressure is well-controlled at 124/76 mmHg with lisinopril . No chest pain, palpitations, or dizziness. She has a history of partial thyroidectomy in March 2017 due to a large nodule on the left side, but she does not take thyroid  medication and her TSH levels have been stable.  She underwent bariatric surgery in February 2018, with her weight initially dropping to 189 lbs but later increasing to 261 lbs in 2023. She is currently losing weight again, with her weight at 253.7 lbs. She takes vitamin D  , B12 and B1  supplements due to her bariatric surgery history.  She uses a CPAP machine for sleep apnea, which has significantly improved her daytime alertness. She uses the CPAP consistently, missing only one day in May. She is considering switching to a nasal mask due to issues with mask leakage.  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 Her nephew's father is currently staying with her to help with childcare.  She is compliant with her duloxetine  medication, which she finds helpful, and she has scheduled various preventive health screenings, including a bone density test, mammogram, CT coronary scan, and colonoscopy. She also plans to schedule an eye exam soon.    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   History of partial thyroidectomy 10/05/2015   Morbid obesity (HCC) 06/04/2015   Breast lump in female 05/17/2015   Acanthosis nigricans 03/09/2015   Allergic rhinitis, seasonal 03/09/2015   Controlled type 2 diabetes mellitus with microalbuminuria (HCC) 03/09/2015   Dyslipidemia 03/09/2015   Gastro-esophageal reflux disease without esophagitis 03/09/2015   Hiatal hernia 03/09/2015   History of anemia 03/09/2015   H/O: hysterectomy 03/09/2015   Calculus of kidney 03/09/2015   Benign hypertension 03/09/2015   Vitamin D  deficiency 03/09/2015   OSA (obstructive sleep apnea) 09/12/2014   Microalbuminuria 05/29/2014    Past Surgical History:  Procedure Laterality Date   ABDOMINAL HYSTERECTOMY  03/2012   BREAST BIOPSY Right 12/2005   Normal-Cyst   BREAST BIOPSY Right 12/2005   BREAST REDUCTION SURGERY     BREAST SURGERY Bilateral 1990   Reduction   BREAST SURGERY Bilateral 1990   COLONOSCOPY WITH PROPOFOL  N/A  05/07/2017   Procedure: COLONOSCOPY WITH PROPOFOL ;  Surgeon: Luke Salaam, MD;  Location: [facility] ENDOSCOPY;  Service: Gastroenterology;  Laterality: N/A;   COLONOSCOPY WITH PROPOFOL  N/A 10/14/2020   Procedure: COLONOSCOPY WITH PROPOFOL ;  Surgeon: Shane Darling, MD;  Location: [facility] ENDOSCOPY;  Service: Endoscopy;  Laterality: N/A;   COLONOSCOPY WITH PROPOFOL  N/A 05/24/2023   Procedure: COLONOSCOPY WITH PROPOFOL ;  Surgeon: Shane Darling, MD;  Location: [facility] ENDOSCOPY;  Service: Endoscopy;  Laterality: N/A;  DM   HEMOSTASIS CONTROL  05/24/2023   Procedure: HEMOSTASIS CONTROL;  Surgeon: Shane Darling, MD;  Location: [facility] ENDOSCOPY;  Service: Endoscopy;;   OOPHORECTOMY Left 03/2011   POLYPECTOMY  05/24/2023   Procedure: POLYPECTOMY;  Surgeon: Shane Darling, MD;  Location: [facility] ENDOSCOPY;  Service: Endoscopy;;   REDUCTION MAMMAPLASTY Bilateral    REDUCTION MAMMAPLASTY Bilateral    SLEEVE GASTROPLASTY  09/2016   [place]   thyroidectomy Left 10/08/2015    Family History  Problem Relation Age of Onset   Cancer Mother 43       died complications of liver cancer   Hypertension Mother    Cirrhosis Mother    COPD Mother        Heavy Smoker   Hepatitis C Mother    Alcohol abuse Father    Heart disease Father    Heart attack Father    Diabetes Brother    Obesity Brother    Lung cancer Maternal Grandmother        Snuff   Diabetes Maternal Grandmother    Alcoholism Maternal Grandmother    Alcoholism Maternal Grandfather    Diabetes Maternal Grandfather    Prostate cancer Paternal Grandfather    Cancer Maternal Uncle        colon cancer   Cancer Cousin 73       colon cancer   Breast cancer Cousin     Social History   Tobacco Use   Smoking status: Never   Smokeless tobacco: Never  Substance Use Topics   Alcohol use: Yes    Alcohol/week: 0.0 standard drinks of alcohol    Comment: wine-social drinker     Current Outpatient Medications:    Biotin 5000 MCG TABS,  Take 5,000 mg by mouth 3 (three) times a week., Disp: , Rfl:    Cholecalciferol (VITAMIN D ) 2000 units CAPS, Take 1 capsule (2,000 Units total) by mouth daily., Disp: 30 capsule, Rfl:    clobetasol  (TEMOVATE ) 0.05 % external solution, Apply 1 Application topically 2 (two) times daily., Disp: 100 mL, Rfl: 0   DULoxetine  (CYMBALTA ) 60 MG capsule, Take 1 capsule (60 mg total) by mouth daily., Disp: 90 capsule, Rfl: 1   ELDERBERRY PO, Take by mouth., Disp: , Rfl:    Flaxseed, Linseed, (FLAXSEED OIL PO), Take by mouth., Disp: , Rfl:    fluticasone  (FLONASE ) 50 MCG/ACT nasal spray, Place 2 sprays into both nostrils daily., Disp: 16 g, Rfl: 6   hydrocortisone  cream 1 %, Apply 1 application topically as needed for itching., Disp: , Rfl:    ketoconazole  (NIZORAL ) 2 % shampoo, Apply 1 Application topically 2 (two) times a week., Disp: 120 mL, Rfl: 0   lisinopril  (ZESTRIL ) 10 MG  tablet, Take 1 tablet (10 mg total) by mouth daily., Disp: 90 tablet, Rfl: 0   loratadine (CLARITIN) 10 MG tablet, Take by mouth., Disp: , Rfl:    Multiple Vitamin (MULTIVITAMIN) tablet, Take 1 tablet by mouth daily., Disp: , Rfl:    rosuvastatin  (CRESTOR ) 10 MG tablet, Take 1 tablet by mouth once daily, Disp: 30 tablet, Rfl: 0   Selenium  Sulfide 2.3 % SHAM, Apply 1 each topically 2 (two) times a week., Disp: 180 mL, Rfl: 2   Thiamine  HCl (VITAMIN B-1 PO), Take 500 mg by mouth daily., Disp: , Rfl:    tirzepatide  (MOUNJARO ) 7.5 MG/0.5ML Pen, Inject 7.5 mg into the skin once a week., Disp: 6 mL, Rfl: 0   Turmeric 500 MG CAPS, Take by mouth., Disp: , Rfl:    vitamin B-12 (CYANOCOBALAMIN ) 1000 MCG tablet, Take 1,000 mcg by mouth 2 (two) times a week., Disp: , Rfl:    vitamin C (ASCORBIC ACID) 500 MG tablet, Take 500 mg by mouth daily., Disp: , Rfl:   Allergies  Allergen Reactions   Zinc     Nausea, vomiting and diarrhea    Latex Rash    I personally reviewed active problem list, medication list, allergies with the  patient/caregiver today.   ROS  Ten systems reviewed and is negative except as mentioned in HPI    Objective Physical Exam Constitutional: Patient appears well-developed and well-nourished. Obese  No distress.  HEENT: head atraumatic, normocephalic, pupils equal and reactive to light, neck supple Cardiovascular: Normal rate, regular rhythm and normal heart sounds.  No murmur heard. No BLE edema. Pulmonary/Chest: Effort normal and breath sounds normal. No respiratory distress. Abdominal: Soft.  There is no tenderness. Psychiatric: Patient has a normal mood and affect. behavior is normal. Judgment and thought content normal.    VITALS: BP- 124/76 MEASUREMENTS: Weight- 253.7. CONSTITUTIONAL: Patient appears well-developed and well-nourished.  No distress. HEENT: Head atraumatic, normocephalic, neck supple. CARDIOVASCULAR: Normal rate, regular rhythm and normal heart sounds.  No murmur heard. No BLE edema. PULMONARY: Effort normal and breath sounds normal. No respiratory distress. ABDOMINAL: There is no tenderness or distention. MUSCULOSKELETAL: Normal gait. Without gross motor or sensory deficit. PSYCHIATRIC: Patient has a normal mood and affect. behavior is normal. Judgment and thought content normal.  Vitals:   12/29/23 0756  BP: 124/76  Pulse: 95  Resp: 16  SpO2: 97%  Weight: 253 lb 11.2 oz (115.1 kg)  Height: 5\' 1"  (1.549 m)    Body mass index is 47.94 kg/m.  Recent Results (from the past 2160 hours)  Lipid panel     Status: Abnormal   Collection Time: 12/03/23  8:40 AM  Result Value Ref Range   Cholesterol 177 <200 mg/dL   HDL 46 (L) > OR = 50 mg/dL   Triglycerides 161 (H) <150 mg/dL    Comment: . If a non-fasting specimen was collected, consider repeat triglyceride testing on a fasting specimen if clinically indicated.  Imagene Mam et al. J. of Clin. Lipidol. 2015;9:129-169. Aaron Aas    LDL Cholesterol (Calc) 98 mg/dL (calc)    Comment: Reference range:  <100 . Desirable range <100 mg/dL for primary prevention;   <70 mg/dL for patients with CHD or diabetic patients  with > or = 2 CHD risk factors. Aaron Aas LDL-C is now calculated using the Martin-Hopkins  calculation, which is a validated novel method providing  better accuracy than the Friedewald equation in the  estimation of LDL-C.  Melinda Sprawls et al. Erroll Heard. 0960;454(09): [phone]  (  http://education.[facility].com/faq/FAQ164)    Total CHOL/HDL Ratio 3.8 <5.0 (calc)   Non-HDL Cholesterol (Calc) 131 (H) <130 mg/dL (calc)    Comment: For patients with diabetes plus 1 major ASCVD risk  factor, treating to a non-HDL-C goal of <100 mg/dL  (LDL-C of <40 mg/dL) is considered a therapeutic  option.   Microalbumin / creatinine urine ratio     Status: None   Collection Time: 12/03/23  8:40 AM  Result Value Ref Range   Creatinine, Urine 246 20 - 275 mg/dL   Microalb, Ur 1.8 mg/dL    Comment: Reference Range Not established    Microalb Creat Ratio 7 <30 mg/g creat    Comment: . The ADA defines abnormalities in albumin excretion as follows: Aaron Aas Albuminuria Category        Result (mg/g creatinine) . Normal to Mildly increased   <30 Moderately increased         30-299  Severely increased           > OR = 300 . The ADA recommends that at least two of three specimens collected within a 3-6 month period be abnormal before considering a patient to be within a diagnostic category.   CBC with Differential/Platelet     Status: Abnormal   Collection Time: 12/03/23  8:40 AM  Result Value Ref Range   WBC 4.3 3.8 - 10.8 Thousand/uL   RBC 4.84 3.80 - 5.10 Million/uL   Hemoglobin 13.9 11.7 - 15.5 g/dL   HCT 98.1 19.1 - 47.8 %   MCV 88.0 80.0 - 100.0 fL   MCH 28.7 27.0 - 33.0 pg   MCHC 32.6 32.0 - 36.0 g/dL    Comment: For adults, a slight decrease in the calculated MCHC value (in the range of 30 to 32 g/dL) is most likely not clinically significant; however, it should be interpreted with  caution in correlation with other red cell parameters and the patient's clinical condition.    RDW 13.0 11.0 - 15.0 %   Platelets 325 140 - 400 Thousand/uL   MPV 10.9 7.5 - 12.5 fL   Neutro Abs 1,157 (L) 1,500 - 7,800 cells/uL   Absolute Lymphocytes 2,722 850 - 3,900 cells/uL   Absolute Monocytes 310 200 - 950 cells/uL   Eosinophils Absolute 90 15 - 500 cells/uL   Basophils Absolute 22 0 - 200 cells/uL   Neutrophils Relative % 26.9 %   Total Lymphocyte 63.3 %   Monocytes Relative 7.2 %   Eosinophils Relative 2.1 %   Basophils Relative 0.5 %  Comprehensive metabolic panel with GFR     Status: Abnormal   Collection Time: 12/03/23  8:40 AM  Result Value Ref Range   Glucose, Bld 94 65 - 99 mg/dL    Comment: .            Fasting reference interval .    BUN 12 7 - 25 mg/dL   Creat 2.95 6.21 - 3.08 mg/dL   eGFR 96 > OR = 60 MV/HQI/6.96E9   BUN/Creatinine Ratio SEE NOTE: 6 - 22 (calc)    Comment:    Not Reported: BUN and Creatinine are within    reference range. .    Sodium 140 135 - 146 mmol/L   Potassium 4.1 3.5 - 5.3 mmol/L   Chloride 104 98 - 110 mmol/L   CO2 27 20 - 32 mmol/L   Calcium  10.0 8.6 - 10.4 mg/dL   Total Protein 7.5 6.1 - 8.1 g/dL   Albumin 4.7 3.6 -  5.1 g/dL   Globulin 2.8 1.9 - 3.7 g/dL (calc)   AG Ratio 1.7 1.0 - 2.5 (calc)   Total Bilirubin 0.4 0.2 - 1.2 mg/dL   Alkaline phosphatase (APISO) 53 37 - 153 U/L   AST 24 10 - 35 U/L   ALT 30 (H) 6 - 29 U/L  Hemoglobin A1c     Status: Abnormal   Collection Time: 12/03/23  8:40 AM  Result Value Ref Range   Hgb A1c MFr Bld 6.2 (H) <5.7 %    Comment: For someone without known diabetes, a hemoglobin  A1c value between 5.7% and 6.4% is consistent with prediabetes and should be confirmed with a  follow-up test. . For someone with known diabetes, a value <7% indicates that their diabetes is well controlled. A1c targets should be individualized based on duration of diabetes, age, comorbid conditions, and  other considerations. . This assay result is consistent with an increased risk of diabetes. . Currently, no consensus exists regarding use of hemoglobin A1c for diagnosis of diabetes for children. .    Mean Plasma Glucose 131 mg/dL   eAG (mmol/L) 7.3 mmol/L    Diabetic Foot Exam:     PHQ2/9:    12/29/2023    7:57 AM 12/03/2023    7:50 AM 08/31/2023    8:12 AM 04/08/2023    8:03 AM 12/22/2022    7:49 AM  Depression screen PHQ 2/9  Decreased Interest 0 0 0 0 0  Down, Depressed, Hopeless 0 0 0 0 0  PHQ - 2 Score 0 0 0 0 0  Altered sleeping 0 0 0 0 0  Tired, decreased energy 0 0 0 0 0  Change in appetite 0 0 0 0 0  Feeling bad or failure about yourself  0 0 0 0 0  Trouble concentrating 0 0 0 0 0  Moving slowly or fidgety/restless 0 0 0 0 0  Suicidal thoughts 0 0 0 0 0  PHQ-9 Score 0 0 0 0 0  Difficult doing work/chores Not difficult at all Not difficult at all Not difficult at all      phq 9 is negative  Fall Risk:    12/29/2023    7:50 AM 08/31/2023    8:08 AM 04/08/2023    8:03 AM 12/22/2022    7:49 AM 12/01/2022    7:38 AM  Fall Risk   Falls in the past year? 0 0 0 1 1  Number falls in past yr: 0 0 0 0 0  Injury with Fall? 0 0 0 0 0  Risk for fall due to : No Fall Risks No Fall Risks No Fall Risks No Fall Risks No Fall Risks  Follow up Falls prevention discussed;Education provided;Falls evaluation completed Falls prevention discussed;Education provided;Falls evaluation completed Falls prevention discussed Falls prevention discussed Falls prevention discussed     Assessment & Plan Type 2 diabetes mellitus with hyperlipidemia Diabetes well-controlled with A1c at 6.2%. LDL at 98 mg/dL, above target <16 mg/dL. Triglycerides at 210 mg/dL. HDL at 46 mg/dL. Mounjaro  and rosuvastatin  used for management. - Increase Mounjaro  dose from 7.5 mg to 10 mg. - Continue rosuvastatin  at current dose. - Encourage dietary modifications and weight loss. - Schedule follow-up in 3.5  months.  Obesity/Morbid  Weight decreased from 255 lbs to 253.7 lbs. Managed with Mounjaro  and lifestyle changes. Gastric sleeve surgery in 2018. - Continue Mounjaro  with increased dose. - Encourage dietary changes and physical activity.  Hypertension Blood pressure controlled at 124/76 mmHg  with lisinopril . - Continue lisinopril  at current dose. - Provide 48-month supply of lisinopril .  Obstructive sleep apnea Managed with CPAP therapy. Improved alertness and compliance. Considering nasal mask for leakage issues. - Continue CPAP therapy. - Try nasal mask for leakage.  Partial thyroidectomy TSH stable post-thyroidectomy. No thyroid  dysfunction symptoms. - Monitor TSH at next visit.  General Health Maintenance Up to date with screenings. Bone density, mammogram, and CT coronary scan scheduled. Colonoscopy and eye exam pending. - Ensure completion of scheduled screenings. - Follow up on colonoscopy and eye exam scheduling.

## 2023-12-31 ENCOUNTER — Encounter: Payer: Self-pay | Admitting: Family Medicine

## 2023-12-31 ENCOUNTER — Telehealth (HOSPITAL_COMMUNITY): Payer: Self-pay | Admitting: *Deleted

## 2023-12-31 NOTE — Telephone Encounter (Signed)
 Attempted to call patient regarding upcoming cardiac CT appointment. Left message on voicemail with name and callback number  Larey Brick RN Navigator Cardiac Imaging [facility] [phone] Office [phone] Cell

## 2024-01-03 ENCOUNTER — Ambulatory Visit: Payer: Self-pay | Admitting: Family Medicine

## 2024-01-03 ENCOUNTER — Ambulatory Visit
Admission: RE | Admit: 2024-01-03 | Discharge: 2024-01-03 | Disposition: A | Discharge status: Home / Self Care | Payer: Self-pay | Source: Ambulatory Visit | Attending: Family Medicine | Admitting: Family Medicine

## 2024-01-03 DIAGNOSIS — E1169 Type 2 diabetes mellitus with other specified complication: Secondary | ICD-10-CM | POA: Insufficient documentation

## 2024-01-03 DIAGNOSIS — Z8249 Family history of ischemic heart disease and other diseases of the circulatory system: Secondary | ICD-10-CM

## 2024-01-03 DIAGNOSIS — I152 Hypertension secondary to endocrine disorders: Secondary | ICD-10-CM | POA: Insufficient documentation

## 2024-01-03 DIAGNOSIS — E1159 Type 2 diabetes mellitus with other circulatory complications: Secondary | ICD-10-CM | POA: Insufficient documentation

## 2024-01-03 DIAGNOSIS — E669 Obesity, unspecified: Secondary | ICD-10-CM | POA: Insufficient documentation

## 2024-01-03 DIAGNOSIS — E119 Type 2 diabetes mellitus without complications: Secondary | ICD-10-CM

## 2024-01-27 ENCOUNTER — Ambulatory Visit
Admission: RE | Admit: 2024-01-27 | Discharge: 2024-01-27 | Disposition: A | Discharge status: Home / Self Care | Source: Ambulatory Visit | Attending: Family Medicine | Admitting: Family Medicine

## 2024-01-27 DIAGNOSIS — Z780 Asymptomatic menopausal state: Secondary | ICD-10-CM | POA: Diagnosis not present

## 2024-01-27 DIAGNOSIS — Z1231 Encounter for screening mammogram for malignant neoplasm of breast: Secondary | ICD-10-CM | POA: Diagnosis not present

## 2024-01-27 DIAGNOSIS — Z0000 Encounter for general adult medical examination without abnormal findings: Secondary | ICD-10-CM | POA: Diagnosis not present

## 2024-01-27 DIAGNOSIS — Z13820 Encounter for screening for osteoporosis: Secondary | ICD-10-CM | POA: Diagnosis not present

## 2024-01-27 DIAGNOSIS — E2839 Other primary ovarian failure: Secondary | ICD-10-CM | POA: Diagnosis not present

## 2024-03-19 ENCOUNTER — Other Ambulatory Visit: Payer: Self-pay | Admitting: Family Medicine

## 2024-03-19 DIAGNOSIS — E1169 Type 2 diabetes mellitus with other specified complication: Secondary | ICD-10-CM

## 2024-03-25 ENCOUNTER — Other Ambulatory Visit: Payer: Self-pay | Admitting: Family Medicine

## 2024-03-25 ENCOUNTER — Other Ambulatory Visit: Payer: Self-pay | Admitting: Medical Genetics

## 2024-03-25 DIAGNOSIS — I152 Hypertension secondary to endocrine disorders: Secondary | ICD-10-CM

## 2024-03-25 DIAGNOSIS — E1169 Type 2 diabetes mellitus with other specified complication: Secondary | ICD-10-CM

## 2024-03-28 ENCOUNTER — Ambulatory Visit (INDEPENDENT_AMBULATORY_CARE_PROVIDER_SITE_OTHER): Payer: BC Managed Care – PPO | Admitting: Dermatology

## 2024-03-28 ENCOUNTER — Encounter: Payer: Self-pay | Admitting: Dermatology

## 2024-03-28 DIAGNOSIS — L219 Seborrheic dermatitis, unspecified: Secondary | ICD-10-CM

## 2024-03-28 DIAGNOSIS — Z7189 Other specified counseling: Secondary | ICD-10-CM | POA: Diagnosis not present

## 2024-03-28 DIAGNOSIS — Z79899 Other long term (current) drug therapy: Secondary | ICD-10-CM | POA: Diagnosis not present

## 2024-03-28 DIAGNOSIS — L918 Other hypertrophic disorders of the skin: Secondary | ICD-10-CM

## 2024-03-28 DIAGNOSIS — L210 Seborrhea capitis: Secondary | ICD-10-CM

## 2024-03-28 MED ORDER — KETOCONAZOLE 2 % EX SHAM: MEDICATED_SHAMPOO | CUTANEOUS | 11 refills | Status: AC

## 2024-03-28 MED ORDER — CLOBETASOL PROPIONATE 0.05 % EX SOLN: CUTANEOUS | 5 refills | Status: AC

## 2024-03-28 MED ORDER — FLUOCINOLONE ACETONIDE SCALP 0.01 % EX OIL: TOPICAL_OIL | CUTANEOUS | 5 refills | Status: AC

## 2024-03-28 NOTE — Progress Notes (Signed)
 New Patient Visit   Subjective  Debra Contreras  is a 58 y.o. female who presents for the following: scalp issues. Hx of seborrheic dermatitis, causing hair loss. States her scalp and ears had areas of weeping fluid.  PCP has prescribed Ketoconazole  shampoo, Selenium  sulfide shampoo, Clobetasol  solution in the past. Has tried apple cider vinegar rinse and H&S shampoo.  Has started drinking Kefir and scalp issue has improved.  Has seen Dr. Jackquline in the past for skin tag removal. Here to reestablish care.  Referred by Dr. Aleen 04/2023.    The following portions of the chart were reviewed this encounter and updated as appropriate: medications, allergies, medical history  Review of Systems:  No other skin or systemic complaints except as noted in HPI or Assessment and Plan.  Objective  Well appearing patient in no apparent distress; mood and affect are within normal limits.  A focused examination was performed of the following areas: Scalp, ears, face  Relevant exam findings are noted in the Assessment and Plan.    Assessment & Plan   SEBORRHEIC DERMATITIS Exam: mild greasy scaling at scalp, no obvious alopecia, hair weaves in today.  Chronic and persistent condition with duration or expected duration over one year. Condition is improving with treatment but not currently at goal.    Seborrheic Dermatitis is a chronic persistent rash characterized by pinkness and scaling most commonly of the mid face but also can occur on the scalp (dandruff), ears; mid chest, mid back and groin.  It tends to be exacerbated by stress and cooler weather.  People who have neurologic disease may experience new onset or exacerbation of existing seborrheic dermatitis.  The condition is not curable but treatable and can be controlled.  Treatment Plan:  Continue Ketoconazole  2% shampoo 1-2 times per week lather on scalp, leave on 8-10 minutes, rinse out. Can use own conditioner following ketoconazole   shampoo. Patient will call when she needs refills.   Start Fluocinolone  scalp oil. Apply to scalp/cover with shower cap the night before you plan to shampoo the next day. Also may apply once or twice a day as needed to scalp/ears.   Continue Clobetasol  solution Apply once or twice daily to affected areas on scalp as needed for itchy inflames areas on scalp  Topical steroids (such as triamcinolone , fluocinolone , fluocinonide, mometasone, clobetasol , halobetasol, betamethasone, hydrocortisone ) can cause thinning and lightening of the skin if they are used for too long in the same area. Your physician has selected the right strength medicine for your problem and area affected on the body. Please use your medication only as directed by your physician to prevent side effects.    Acrochordons (Skin Tags) - Fleshy, skin-colored pedunculated papules neck, abdomen - Benign appearing.  - Observe. - If desired, they can be removed with an in office procedure that is not covered by insurance. - Please call the clinic if you notice any new or changing lesions.   SEBORRHEIC KERATOSIS - Stuck-on, waxy, tan-brown papules face, neck - Benign-appearing - Discussed benign etiology and prognosis. - Observe - Call for any changes  SEBORRHEA CAPITIS IN ADULT   Related Medications Selenium  Sulfide 2.3 % SHAM Apply 1 each topically 2 (two) times a week. SEBORRHEIC DERMATITIS    Return if symptoms worsen or fail to improve.  I, Jill Parcell, CMA, am acting as scribe for Rexene Jackquline, MD.   Documentation: I have reviewed the above documentation for accuracy and completeness, and I agree with the above.  Rexene Jackquline,  MD

## 2024-03-28 NOTE — Patient Instructions (Addendum)
 Continue Ketoconazole  2% shampoo 1-2 times per week lather on scalp, leave on 8-10 minutes, rinse out. Can use own shampoo and conditioner following ketoconazole .   Start Fluocinolone  scalp oil. Apply to scalp the night before you plan to shampoo the next day. Apply once or twice a day as needed to ears.   Continue Clobetasol  solution Apply once or twice daily to affected areas on scalp as needed for worse seb derm flares    Seborrheic Dermatitis is a chronic persistent rash characterized by pinkness and scaling most commonly of the mid face but also can occur on the scalp (dandruff), ears; mid chest, mid back and groin.  It tends to be exacerbated by stress and cooler weather.  People who have neurologic disease may experience new onset or exacerbation of existing seborrheic dermatitis.  The condition is not curable but treatable and can be controlled.  Topical steroids (such as triamcinolone , fluocinolone , fluocinonide, mometasone, clobetasol , halobetasol, betamethasone, hydrocortisone ) can cause thinning and lightening of the skin if they are used for too long in the same area. Your physician has selected the right strength medicine for your problem and area affected on the body. Please use your medication only as directed by your physician to prevent side effects.   Due to recent changes in healthcare laws, you may see results of your pathology and/or laboratory studies on MyChart before the doctors have had a chance to review them. We understand that in some cases there may be results that are confusing or concerning to you. Please understand that not all results are received at the same time and often the doctors may need to interpret multiple results in order to provide you with the best plan of care or course of treatment. Therefore, we ask that you please give us  2 business days to thoroughly review all your results before contacting the office for clarification. Should we see a critical lab  result, you will be contacted sooner.   If You Need Anything After Your Visit  If you have any questions or concerns for your doctor, please call our main line at [phone] and press option 4 to reach your doctor's medical assistant. If no one answers, please leave a voicemail as directed and we will return your call as soon as possible. Messages left after 4 pm will be answered the following business day.   You may also send us  a message via MyChart. We typically respond to MyChart messages within 1-2 business days.  For prescription refills, please ask your pharmacy to contact our office. Our fax number is [phone].  If you have an urgent issue when the clinic is closed that cannot wait until the next business day, you can page your doctor at the number below.    Please note that while we do our best to be available for urgent issues outside of office hours, we are not available 24/7.   If you have an urgent issue and are unable to reach us , you may choose to seek medical care at your doctor's office, retail clinic, urgent care center, or emergency room.  If you have a medical emergency, please immediately call 911 or go to the emergency department.  Pager Numbers  - Dr. Hester: [phone]  - Dr. Jackquline: [phone]  - Dr. Claudene: [phone]   - Dr. Raymund: [phone]  In the event of inclement weather, please call our main line at [phone] for an update on the status of any delays or closures.  Dermatology Medication Tips:  Please keep the boxes that topical medications come in in order to help keep track of the instructions about where and how to use these. Pharmacies typically print the medication instructions only on the boxes and not directly on the medication tubes.   If your medication is too expensive, please contact our office at [phone] option 4 or send us  a message through MyChart.   We are unable to tell what your co-pay for medications will be  in advance as this is different depending on your insurance coverage. However, we may be able to find a substitute medication at lower cost or fill out paperwork to get insurance to cover a needed medication.   If a prior authorization is required to get your medication covered by your insurance company, please allow us  1-2 business days to complete this process.  Drug prices often vary depending on where the prescription is filled and some pharmacies may offer cheaper prices.  The website www.goodrx.com contains coupons for medications through different pharmacies. The prices here do not account for what the cost may be with help from insurance (it may be cheaper with your insurance), but the website can give you the price if you did not use any insurance.  - You can print the associated coupon and take it with your prescription to the pharmacy.  - You may also stop by our office during regular business hours and pick up a GoodRx coupon card.  - If you need your prescription sent electronically to a different pharmacy, notify our office through [facility] or by phone at [phone] option 4.     Si Usted Necesita Algo Despus de Su Visita  Tambin puede enviarnos un mensaje a travs de Clinical cytogeneticist. Por lo general respondemos a los mensajes de MyChart en el transcurso de 1 a 2 das hbiles.  Para renovar recetas, por favor pida a su farmacia que se ponga en contacto con nuestra oficina. Randi lakes de fax es [place] [phone].  Si tiene un asunto urgente cuando la clnica est cerrada y que no puede esperar hasta el siguiente da hbil, puede llamar/localizar a su doctor(a) al nmero que aparece a continuacin.   Por favor, tenga en cuenta que aunque hacemos todo lo posible para estar disponibles para asuntos urgentes fuera del horario de [place], no estamos disponibles las 24 horas del da, los 7 [address]  [address] de la [place].   Si tiene un problema urgente y no puede comunicarse con nosotros,  puede optar por buscar atencin mdica  en el consultorio de su doctor(a), en una clnica privada, en un centro de atencin urgente o en una sala de emergencias.  Si tiene Engineer, drilling, por favor llame inmediatamente al 911 o vaya a la sala de emergencias.  Nmeros de bper  - Dr. Hester: [phone]  - Dra. Jackquline: [phone]  - Dr. Claudene: [phone]  - Dra. Kitts: [phone]  En caso de inclemencias del [place], por favor llame a nuestra lnea principal al [phone] para una actualizacin sobre el estado de cualquier retraso o cierre.  Consejos para la medicacin en dermatologa: Por favor, guarde las cajas en las que vienen los medicamentos de uso tpico para ayudarle a seguir las instrucciones sobre dnde y cmo usarlos. Las farmacias generalmente imprimen las instrucciones del medicamento slo en las cajas y no directamente en los tubos del [place].   Si su medicamento es muy caro, por favor, pngase en contacto con landry rieger llamando al [phone] y presione la opcin 4 o  envenos un mensaje a travs de MyChart.   No podemos decirle cul ser su copago por los medicamentos por adelantado ya que esto es diferente dependiendo de la cobertura de su seguro. Sin embargo, es posible que podamos encontrar un medicamento sustituto a Audiological scientist un formulario para que el seguro cubra el medicamento que se considera necesario.   Si se requiere una autorizacin previa para que su compaa de seguros malta su medicamento, por favor permtanos de 1 a 2 das hbiles para completar este proceso.  Los precios de los medicamentos varan con frecuencia dependiendo del Environmental consultant de dnde se surte la receta y alguna farmacias pueden ofrecer precios ms baratos.  El sitio web www.goodrx.com tiene cupones para medicamentos de Health and safety inspector. Los precios aqu no tienen en cuenta lo que podra costar con la ayuda del seguro (puede ser ms barato con su seguro),  pero el sitio web puede darle el precio si no utiliz Tourist information centre manager.  - Puede imprimir el cupn correspondiente y llevarlo con su receta a la farmacia.  - Tambin puede pasar por nuestra oficina durante el horario de atencin regular y Education officer, museum una tarjeta de cupones de GoodRx.  - Si necesita que su receta se enve electrnicamente a una farmacia diferente, informe a nuestra oficina a travs de MyChart de [place] o por telfono llamando al [phone] y presione la opcin 4.

## 2024-03-31 ENCOUNTER — Encounter
Admission: RE | Disposition: A | Discharge status: Home / Self Care | Payer: Self-pay | Source: Home / Self Care | Attending: Gastroenterology

## 2024-03-31 ENCOUNTER — Ambulatory Visit: Admitting: Anesthesiology

## 2024-03-31 ENCOUNTER — Ambulatory Visit
Admission: RE | Admit: 2024-03-31 | Discharge: 2024-03-31 | Disposition: A | Discharge status: Home / Self Care | Attending: Gastroenterology | Admitting: Gastroenterology

## 2024-03-31 ENCOUNTER — Encounter: Payer: Self-pay | Admitting: Gastroenterology

## 2024-03-31 DIAGNOSIS — D123 Benign neoplasm of transverse colon: Secondary | ICD-10-CM | POA: Insufficient documentation

## 2024-03-31 DIAGNOSIS — D122 Benign neoplasm of ascending colon: Secondary | ICD-10-CM | POA: Diagnosis not present

## 2024-03-31 DIAGNOSIS — R197 Diarrhea, unspecified: Secondary | ICD-10-CM | POA: Diagnosis not present

## 2024-03-31 DIAGNOSIS — I10 Essential (primary) hypertension: Secondary | ICD-10-CM | POA: Diagnosis not present

## 2024-03-31 DIAGNOSIS — D120 Benign neoplasm of cecum: Secondary | ICD-10-CM | POA: Insufficient documentation

## 2024-03-31 DIAGNOSIS — K640 First degree hemorrhoids: Secondary | ICD-10-CM | POA: Diagnosis not present

## 2024-03-31 DIAGNOSIS — Z860101 Personal history of adenomatous and serrated colon polyps: Secondary | ICD-10-CM | POA: Diagnosis not present

## 2024-03-31 DIAGNOSIS — E119 Type 2 diabetes mellitus without complications: Secondary | ICD-10-CM | POA: Diagnosis not present

## 2024-03-31 DIAGNOSIS — E785 Hyperlipidemia, unspecified: Secondary | ICD-10-CM | POA: Insufficient documentation

## 2024-03-31 DIAGNOSIS — Z9884 Bariatric surgery status: Secondary | ICD-10-CM | POA: Diagnosis not present

## 2024-03-31 DIAGNOSIS — Z6841 Body Mass Index (BMI) 40.0 and over, adult: Secondary | ICD-10-CM | POA: Insufficient documentation

## 2024-03-31 DIAGNOSIS — K219 Gastro-esophageal reflux disease without esophagitis: Secondary | ICD-10-CM | POA: Diagnosis not present

## 2024-03-31 DIAGNOSIS — G4730 Sleep apnea, unspecified: Secondary | ICD-10-CM | POA: Insufficient documentation

## 2024-03-31 DIAGNOSIS — K639 Disease of intestine, unspecified: Secondary | ICD-10-CM | POA: Diagnosis not present

## 2024-03-31 DIAGNOSIS — K5730 Diverticulosis of large intestine without perforation or abscess without bleeding: Secondary | ICD-10-CM | POA: Insufficient documentation

## 2024-03-31 DIAGNOSIS — Z7984 Long term (current) use of oral hypoglycemic drugs: Secondary | ICD-10-CM | POA: Insufficient documentation

## 2024-03-31 DIAGNOSIS — Z09 Encounter for follow-up examination after completed treatment for conditions other than malignant neoplasm: Secondary | ICD-10-CM | POA: Diagnosis not present

## 2024-03-31 DIAGNOSIS — K635 Polyp of colon: Secondary | ICD-10-CM | POA: Diagnosis not present

## 2024-03-31 DIAGNOSIS — Z1211 Encounter for screening for malignant neoplasm of colon: Secondary | ICD-10-CM | POA: Diagnosis not present

## 2024-03-31 DIAGNOSIS — K649 Unspecified hemorrhoids: Secondary | ICD-10-CM | POA: Diagnosis not present

## 2024-03-31 HISTORY — PX: COLONOSCOPY: SHX5424

## 2024-03-31 HISTORY — PX: POLYPECTOMY: SHX149

## 2024-03-31 SURGERY — COLONOSCOPY
Anesthesia: General

## 2024-03-31 MED ORDER — SODIUM CHLORIDE 0.9 % IV SOLN
INTRAVENOUS | Status: DC
  Administered 2024-03-31: 20 mL/h via INTRAVENOUS

## 2024-03-31 MED ORDER — PROPOFOL 500 MG/50ML IV EMUL
INTRAVENOUS | Status: DC | PRN
  Administered 2024-03-31: 140 ug/kg/min via INTRAVENOUS

## 2024-03-31 MED ORDER — PROPOFOL 10 MG/ML IV BOLUS
INTRAVENOUS | Status: DC | PRN
  Administered 2024-03-31: 70 mg via INTRAVENOUS

## 2024-03-31 NOTE — Transfer of Care (Signed)
 Immediate Anesthesia Transfer of Care Note  Patient: Debra Contreras   Procedure(s) Performed: COLONOSCOPY POLYPECTOMY, INTESTINE  Patient Location: PACU  Anesthesia Type:General  Level of Consciousness: awake, alert , and oriented  Airway & Oxygen Therapy: Patient Spontanous Breathing  Post-op Assessment: Report given to RN and Post -op Vital signs reviewed and stable  Post vital signs: Reviewed and stable  Last Vitals:  Vitals Value Taken Time  BP 97/60 03/31/24 09:15  Temp    Pulse 82 03/31/24 09:17  Resp 20 03/31/24 09:17  SpO2 98 % 03/31/24 09:17  Vitals shown include unfiled device data.  Last Pain:  Vitals:   03/31/24 0915  TempSrc:   PainSc: 0-No pain         Complications: No notable events documented.

## 2024-03-31 NOTE — Anesthesia Postprocedure Evaluation (Signed)
 Anesthesia Post Note  Patient: Debra Contreras   Procedure(s) Performed: COLONOSCOPY POLYPECTOMY, INTESTINE  Patient location during evaluation: PACU Anesthesia Type: General Level of consciousness: awake and alert Pain management: satisfactory to patient Vital Signs Assessment: post-procedure vital signs reviewed and stable Respiratory status: spontaneous breathing Cardiovascular status: stable Anesthetic complications: no   No notable events documented.   Last Vitals:  Vitals:   03/31/24 0915 03/31/24 0925  BP: 97/60 121/79  Pulse: 82 83  Resp: 19 20  Temp:    SpO2: 97% 99%    Last Pain:  Vitals:   03/31/24 0925  TempSrc:   PainSc: 0-No pain                 VAN STAVEREN,Hamish Banks

## 2024-03-31 NOTE — Anesthesia Preprocedure Evaluation (Signed)
 Anesthesia Evaluation  Patient identified by MRN, date of birth, ID band Patient awake    Reviewed: Allergy & Precautions, NPO status , Patient's Chart, lab work & pertinent test results  Airway Mallampati: III  TM Distance: <3 FB Neck ROM: Full    Dental  (+) Teeth Intact   Pulmonary neg pulmonary ROS, sleep apnea and Continuous Positive Airway Pressure Ventilation    Pulmonary exam normal breath sounds clear to auscultation       Cardiovascular Exercise Tolerance: Good hypertension, Pt. on medications negative cardio ROS Normal cardiovascular exam Rhythm:Regular Rate:Normal     Neuro/Psych    Depression    negative neurological ROS  negative psych ROS   GI/Hepatic negative GI ROS, Neg liver ROS, hiatal hernia,GERD  Medicated,,  Endo/Other  negative endocrine ROSdiabetes, Well Controlled, Type 2, Oral Hypoglycemic Agents  Class 4 obesity  Renal/GU negative Renal ROS  negative genitourinary   Musculoskeletal   Abdominal  (+) + obese  Peds negative pediatric ROS (+)  Hematology negative hematology ROS (+)   Anesthesia Other Findings Past Medical History: 03/09/2015: Acanthosis nigricans 03/09/2015: Allergic rhinitis No date: Allergy 10/142016: Breast lump in female No date: Calculus of kidney No date: GERD (gastroesophageal reflux disease) 03/09/2015: Hiatal hernia 03/09/2015: History of anemia No date: History of kidney stones No date: History of PCOS No date: History of thyroid  nodule No date: Hyperlipidemia No date: Hypertension No date: Kidney stones No date: Microalbuminuria     Comment:  DM 05/29/2014: Microalbuminuria 06/04/2015: Morbid obesity (HCC) No date: Pre-diabetes No date: Sleep apnea No date: Snoring No date: Uterus, adenomyosis No date: Vitamin D  deficiency 03/09/2015: Vitamin D  deficiency  Past Surgical History: 03/2012: ABDOMINAL HYSTERECTOMY 12/2005: BREAST BIOPSY; Right      Comment:  Normal-Cyst 12/2005: BREAST BIOPSY; Right No date: BREAST REDUCTION SURGERY 1990: BREAST SURGERY; Bilateral     Comment:  Reduction 1990: BREAST SURGERY; Bilateral 05/07/2017: COLONOSCOPY WITH PROPOFOL ; N/A     Comment:  Procedure: COLONOSCOPY WITH PROPOFOL ;  Surgeon: Therisa Bi, MD;  [facility] ENDOSCOPY;  Service:               Gastroenterology;  Laterality: N/A; 10/14/2020: COLONOSCOPY WITH PROPOFOL ; N/A     Comment:  Procedure: COLONOSCOPY WITH PROPOFOL ;  Surgeon:               Maryruth Ole DASEN, MD;  Location: [facility] ENDOSCOPY;                Service: Endoscopy;  Laterality: N/A; 05/24/2023: COLONOSCOPY WITH PROPOFOL ; N/A     Comment:  Procedure: COLONOSCOPY WITH PROPOFOL ;  Surgeon:               Maryruth Ole DASEN, MD;  Location: [facility] ENDOSCOPY;                Service: Endoscopy;  Laterality: N/A;  DM 05/24/2023: HEMOSTASIS CONTROL     Comment:  Procedure: HEMOSTASIS CONTROL;  Surgeon: Maryruth Ole DASEN, MD;  Location: [facility] ENDOSCOPY;  Service:               Endoscopy;; 03/2011: OOPHORECTOMY; Left 05/24/2023: POLYPECTOMY     Comment:  Procedure: POLYPECTOMY;  Surgeon: Maryruth Ole DASEN,               MD;  Location: [facility] ENDOSCOPY;  Service: Endoscopy;; No date:  REDUCTION MAMMAPLASTY; Bilateral No date: REDUCTION MAMMAPLASTY; Bilateral 09/2016: SLEEVE GASTROPLASTY     Comment:  [place] 10/08/2015: thyroidectomy; Left  BMI    Body Mass Index: 45.57 kg/m      Reproductive/Obstetrics negative OB ROS                              Anesthesia Physical Anesthesia Plan  ASA: 3  Anesthesia Plan: General   Post-op Pain Management:    Induction: Intravenous  PONV Risk Score and Plan: Propofol  infusion and TIVA  Airway Management Planned: Natural Airway and Nasal Cannula  Additional Equipment:   Intra-op Plan:   Post-operative Plan:   Informed Consent: I have reviewed the patients History and  Physical, chart, labs and discussed the procedure including the risks, benefits and alternatives for the proposed anesthesia with the patient or authorized representative who has indicated his/her understanding and acceptance.     Dental Advisory Given  Plan Discussed with: CRNA  Anesthesia Plan Comments:         Anesthesia Quick Evaluation

## 2024-03-31 NOTE — Op Note (Signed)
 [facility] Gastroenterology Patient Name: Debra Contreras  Procedure Date: 03/31/2024 8:39 AM MRN: [id number] Account #: [id number] Date of Birth: [birth year] Admit Type: Outpatient Age: 58 Room: [facility] ENDO ROOM 3 Gender: Female Note Status: Finalized Instrument Name: Colon Scope [id number] Procedure:             Colonoscopy Indications:           Surveillance: Personal history of piecemeal removal of                         adenoma on last colonoscopy (less than 1 year ago) Providers:             Ole Schick MD, MD Referring MD:          Dorette FALCON. Sowles, MD (Referring MD) Medicines:             Monitored Anesthesia Care Complications:         No immediate complications. Estimated blood loss:                         Minimal. Procedure:             Pre-Anesthesia Assessment:                        - Prior to the procedure, a History and Physical was                         performed, and patient medications and allergies were                         reviewed. The patient is competent. The risks and                         benefits of the procedure and the sedation options and                         risks were discussed with the patient. All questions                         were answered and informed consent was obtained.                         Patient identification and proposed procedure were                         verified by the physician, the nurse, the                         anesthesiologist, the anesthetist and the technician                         in the endoscopy suite. Mental Status Examination:                         alert and oriented. Airway Examination: normal                         oropharyngeal airway and neck mobility. Respiratory  Examination: clear to auscultation. CV Examination:                         normal. Prophylactic Antibiotics: The patient does not                         require prophylactic  antibiotics. Prior                         Anticoagulants: The patient has taken no anticoagulant                         or antiplatelet agents. ASA Grade Assessment: III - A                         patient with severe systemic disease. After reviewing                         the risks and benefits, the patient was deemed in                         satisfactory condition to undergo the procedure. The                         anesthesia plan was to use monitored anesthesia care                         (MAC). Immediately prior to administration of                         medications, the patient was re-assessed for adequacy                         to receive sedatives. The heart rate, respiratory                         rate, oxygen saturations, blood pressure, adequacy of                         pulmonary ventilation, and response to care were                         monitored throughout the procedure. The physical                         status of the patient was re-assessed after the                         procedure.                        After obtaining informed consent, the colonoscope was                         passed under direct vision. Throughout the procedure,                         the patient's blood pressure, pulse, and oxygen  saturations were monitored continuously. The                         Colonoscope was introduced through the anus and                         advanced to the the cecum, identified by appendiceal                         orifice and ileocecal valve. The colonoscopy was                         performed without difficulty. The patient tolerated                         the procedure well. The quality of the bowel                         preparation was good. The ileocecal valve, appendiceal                         orifice, and rectum were photographed. Findings:      The perianal and digital rectal examinations were normal.      A 2  mm polyp was found in the ileocecal valve. The polyp was sessile.       This is likely residual polyp from previous polypectomy. The polyp was       removed with a cold snare. Resection and retrieval were complete.       Estimated blood loss was minimal.      A 3 mm polyp was found in the ascending colon. The polyp was sessile.       The polyp was removed with a cold snare. Resection and retrieval were       complete. Estimated blood loss was minimal.      A post polypectomy scar was found in the ascending colon. The scar was       unremarkable in appearance. Biopsies were taken with a cold forceps for       histology. Estimated blood loss was minimal.      A 5 mm polyp was found in the transverse colon. The polyp was sessile.       The polyp was removed with a cold snare. Resection and retrieval were       complete. Estimated blood loss was minimal.      Multiple large-mouthed and small-mouthed diverticula were found in the       sigmoid colon, descending colon and transverse colon.      Internal hemorrhoids were found during retroflexion. The hemorrhoids       were Grade I (internal hemorrhoids that do not prolapse).      The exam was otherwise without abnormality on direct and retroflexion       views. Impression:            - One 2 mm polyp at the ileocecal valve, removed with                         a cold snare. Resected and retrieved.                        - One 3  mm polyp in the ascending colon, removed with                         a cold snare. Resected and retrieved.                        - Post-polypectomy scar in the ascending colon.                         Biopsied.                        - One 5 mm polyp in the transverse colon, removed with                         a cold snare. Resected and retrieved.                        - Diverticulosis in the sigmoid colon, in the                         descending colon and in the transverse colon.                        - Internal  hemorrhoids.                        - The examination was otherwise normal on direct and                         retroflexion views. Recommendation:        - Discharge patient to home.                        - Resume previous diet.                        - Continue present medications.                        - Await pathology results.                        - Repeat colonoscopy for surveillance based on                         pathology results.                        - Return to referring physician as previously                         scheduled. Procedure Code(s):     --- Professional ---                        [phone], Colonoscopy, flexible; with removal of                         tumor(s), polyp(s), or other lesion(s) by snare  technique                        [id number], 59, Colonoscopy, flexible; with biopsy, single                         or multiple Diagnosis Code(s):     --- Professional ---                        D12.0, Benign neoplasm of cecum                        D12.2, Benign neoplasm of ascending colon                        D12.3, Benign neoplasm of transverse colon (hepatic                         flexure or splenic flexure)                        Z98.890, Other specified postprocedural states                        K64.0, First degree hemorrhoids                        Z09, Encounter for follow-up examination after                         completed treatment for conditions other than                         malignant neoplasm                        Z86.010, Personal history of colonic polyps                        K57.30, Diverticulosis of large intestine without                         perforation or abscess without bleeding CPT copyright 2022 American Medical Association. All rights reserved. The codes documented in this report are preliminary and upon coder review may  be revised to meet current compliance requirements. Ole Schick MD,  MD 03/31/2024 9:21:18 AM Number of Addenda: 0 Note Initiated On: 03/31/2024 8:39 AM Scope Withdrawal Time: 0 hours 16 minutes 57 seconds  Total Procedure Duration: 0 hours 25 minutes 40 seconds  Estimated Blood Loss:  Estimated blood loss was minimal.      [facility]

## 2024-03-31 NOTE — H&P (Signed)
 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 Last colonoscopy in 2024.  No blood thinners. History of hysterectomy and sleeve gastrectomy. No family history of colon cancer.     Current Facility-Administered Medications:    0.9 %  sodium chloride  infusion, , Intravenous, Continuous, Andreanna Mikolajczak, Debra ONEIDA, MD, Last Rate: 20 mL/hr at 03/31/24 0835, Continued from Pre-op at 03/31/24 0835  Medications Prior to Admission  Medication Sig Dispense Refill Last Dose/Taking   Biotin 5000 MCG TABS Take 5,000 mg by mouth 3 (three) times a week.   03/30/2024   Cholecalciferol (VITAMIN D ) 2000 units CAPS Take 1 capsule (2,000 Units total) by mouth daily. 30 capsule  03/30/2024   clobetasol  (TEMOVATE ) 0.05 % external solution Apply once or twice daily to affected areas on scalp as needed for worse seb derm flares 50 mL 5 03/30/2024   DULoxetine  (CYMBALTA ) 60 MG capsule Take 1 capsule (60 mg total) by mouth daily. 90 capsule 1 03/30/2024   ELDERBERRY PO Take by mouth.   03/30/2024   Flaxseed, Linseed, (FLAXSEED OIL PO) Take by mouth.   03/30/2024   Fluocinolone  Acetonide Scalp 0.01 % OIL Apply once or twice daily to scalp and ears as needed for seborrheic dermatitis flares 120 mL 5 03/30/2024   fluticasone  (FLONASE ) 50 MCG/ACT nasal spray Place 2 sprays into both nostrils daily. 16 g 6 03/30/2024   hydrocortisone  cream 1 % Apply 1 application topically as needed for itching.   03/30/2024   ketoconazole  (NIZORAL ) 2 % shampoo 1-2 times per week lather on scalp, leave on 8-10 minutes, rinse out. 120 mL 11 03/30/2024   lisinopril  (ZESTRIL ) 10 MG tablet Take 1 tablet (10 mg total) by mouth daily. 90 tablet 1 03/30/2024   loratadine  (CLARITIN) 10 MG tablet Take by mouth.   03/30/2024   Multiple Vitamin (MULTIVITAMIN) tablet Take 1 tablet by mouth daily.   03/30/2024   rosuvastatin  (CRESTOR ) 10 MG tablet Take 1 tablet (10 mg total) by mouth daily. 90 tablet 1 03/30/2024   Selenium  Sulfide 2.3 % SHAM Apply 1 each topically 2 (two) times a week. 180 mL 2 03/30/2024   Thiamine  HCl (VITAMIN B-1 PO) Take 500 mg by mouth daily.   03/30/2024   tirzepatide  (MOUNJARO ) 10 MG/0.5ML Pen Inject 10 mg into the skin once a week. 6 mL 0 Past Month   Turmeric 500 MG CAPS Take by mouth.   03/30/2024   vitamin B-12 (CYANOCOBALAMIN ) 1000 MCG tablet Take 1,000 mcg by mouth 2 (two) times a week.   03/30/2024   vitamin C (ASCORBIC ACID) 500 MG tablet Take 500 mg by mouth daily.   03/30/2024     Allergies  Allergen Reactions   Zinc     Nausea, vomiting and diarrhea    Latex Rash     Past Medical History:  Diagnosis Date   Acanthosis nigricans 03/09/2015   Allergic rhinitis 03/09/2015   Allergy    Breast lump in female 10/142016   Calculus of kidney    GERD (gastroesophageal reflux disease)    Hiatal hernia 03/09/2015   History of anemia 03/09/2015   History of kidney stones    History of PCOS    History of thyroid  nodule  Hyperlipidemia    Hypertension    Kidney stones    Microalbuminuria    DM   Microalbuminuria 05/29/2014   Morbid obesity (HCC) 06/04/2015   Pre-diabetes    Sleep apnea    Snoring    Uterus, adenomyosis    Vitamin D  deficiency    Vitamin D  deficiency 03/09/2015    Review of systems:  Otherwise negative.    Physical Exam  Gen: Alert, oriented. Appears stated age.  HEENT: PERRLA. Lungs: No respiratory distress CV: RRR Abd: soft, benign, no masses Ext: No edema    Planned procedures: Proceed with colonoscopy. The patient understands the nature of the planned procedure, indications, risks, alternatives and potential complications including but not limited to bleeding, infection, perforation, damage to  internal organs and possible oversedation/side effects from anesthesia. The patient agrees and gives consent to proceed.  Please refer to procedure notes for findings, recommendations and patient disposition/instructions.     Debra ONEIDA Schick, MD [facility]

## 2024-03-31 NOTE — Interval H&P Note (Signed)
 History and Physical Interval Note:  03/31/2024 8:39 AM  Debra Contreras   has presented today for surgery, with the diagnosis of PH TA Polyps.  The various methods of treatment have been discussed with the patient and family. After consideration of risks, benefits and other options for treatment, the patient has consented to  Procedure(s) with comments: COLONOSCOPY (N/A) - Mounjaro  as a surgical intervention.  The patient's history has been reviewed, patient examined, no change in status, stable for surgery.  I have reviewed the patient's chart and labs.  Questions were answered to the patient's satisfaction.     Ole ONEIDA Schick  Ok to proceed with colonoscopy

## 2024-04-04 ENCOUNTER — Encounter: Payer: Self-pay | Admitting: Family Medicine

## 2024-04-04 ENCOUNTER — Ambulatory Visit (INDEPENDENT_AMBULATORY_CARE_PROVIDER_SITE_OTHER): Admitting: Family Medicine

## 2024-04-04 VITALS — BP 118/82 | HR 91 | Temp 98.4°F | Resp 16 | Ht 61.0 in | Wt 240.6 lb

## 2024-04-04 DIAGNOSIS — E890 Postprocedural hypothyroidism: Secondary | ICD-10-CM | POA: Diagnosis not present

## 2024-04-04 DIAGNOSIS — E538 Deficiency of other specified B group vitamins: Secondary | ICD-10-CM | POA: Diagnosis not present

## 2024-04-04 DIAGNOSIS — K219 Gastro-esophageal reflux disease without esophagitis: Secondary | ICD-10-CM

## 2024-04-04 DIAGNOSIS — Z1159 Encounter for screening for other viral diseases: Secondary | ICD-10-CM

## 2024-04-04 DIAGNOSIS — E669 Obesity, unspecified: Secondary | ICD-10-CM

## 2024-04-04 DIAGNOSIS — E1169 Type 2 diabetes mellitus with other specified complication: Secondary | ICD-10-CM

## 2024-04-04 DIAGNOSIS — Z23 Encounter for immunization: Secondary | ICD-10-CM

## 2024-04-04 DIAGNOSIS — G4733 Obstructive sleep apnea (adult) (pediatric): Secondary | ICD-10-CM | POA: Diagnosis not present

## 2024-04-04 DIAGNOSIS — K648 Other hemorrhoids: Secondary | ICD-10-CM

## 2024-04-04 DIAGNOSIS — E785 Hyperlipidemia, unspecified: Secondary | ICD-10-CM | POA: Diagnosis not present

## 2024-04-04 DIAGNOSIS — I152 Hypertension secondary to endocrine disorders: Secondary | ICD-10-CM

## 2024-04-04 DIAGNOSIS — E519 Thiamine deficiency, unspecified: Secondary | ICD-10-CM | POA: Insufficient documentation

## 2024-04-04 DIAGNOSIS — Z9884 Bariatric surgery status: Secondary | ICD-10-CM | POA: Insufficient documentation

## 2024-04-04 DIAGNOSIS — E1159 Type 2 diabetes mellitus with other circulatory complications: Secondary | ICD-10-CM

## 2024-04-04 DIAGNOSIS — L821 Other seborrheic keratosis: Secondary | ICD-10-CM

## 2024-04-04 LAB — POCT GLYCOSYLATED HEMOGLOBIN (HGB A1C): Hemoglobin A1C: 5.6 % (ref 4.0–5.6)

## 2024-04-04 LAB — SURGICAL PATHOLOGY

## 2024-04-04 MED ORDER — TIRZEPATIDE 10 MG/0.5ML ~~LOC~~ SOAJ: 10.0000 mg | SUBCUTANEOUS | 0 refills | Status: DC

## 2024-04-04 NOTE — Telephone Encounter (Signed)
Printed for PCP

## 2024-04-04 NOTE — Progress Notes (Signed)
 Name: Debra Contreras    MRN: [id number]    DOB: 1965-09-25   Date:04/04/2024       Progress Note  Subjective  Chief Complaint  Chief Complaint  Patient presents with   Medical Management of Chronic Issues   Discussed the use of AI scribe software for clinical note transcription with the patient, who gave verbal consent to proceed.  History of Present Illness Debra Contreras  is a 58 year old female with diabetes, hypertension, and obesity who presents for a follow-up visit.  She consistently uses her CPAP machine every night, except for one night due to a colonoscopy, and experiences increased energy levels with its use, likening it to the effect of a 'cup of coffee'.  She recently underwent a colonoscopy on March 31, 2024, where three polyps were found and removed, and a biopsy was performed on scar tissue. She is awaiting pathology results. She has a history of frequent polyp formation and has undergone multiple colonoscopies. She also has diverticulosis, which her doctor told her appeared larger on recent colonoscopy, and internal hemorrhoids. No blood in stools and she reports a good appetite.  She has seborrheic dermatitis, primarily affecting her scalp and face. She uses ketoconazole  shampoo 2% once or twice a week, fluocinolone  scalp oil at night before shampooing, and clobetasol  solution as needed. She sees a dermatologist, Dr. Jackquline, who provided these treatments.  She has a history of diabetes associated with hypertension, obesity, and dyslipidemia. Her A1c has improved significantly, now at 5.6%. She attributes her weight loss and improved diabetes control to dietary changes, including increased vegetable intake and reduced junk food consumption. She is currently on Mounjaro  10 mg for DM and obesity , lisinopril  for HTN , and rosuvastatin  for dyslipidemia 10 mg. She reports a weight loss from 261 lbs in 2023 to 240.6 lbs currently.  She has a history of bariatric surgery in  2018, with an initial weight of 270 lbs, reducing to 189 lbs post-surgery, and then increasing to 261 lbs by 2023. She is now losing weight again with the help of Mounjaro .  She reports a recent phlebitis incident following a blood donation attempt, resulting in a small knot on her arm, which is gradually resolving.  She is participating in a genetic research study, [employer], which involves DNA mapping for genetic conditions. She has authorized the results to be shared with her healthcare provider.  She takes vitamin D  daily and B12 and B1 weekly due to previous deficiencies post-bariatric surgery. She is due for blood work to check her thyroid  function since she had partial thyroidectomy years ago.      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   History of partial thyroidectomy 10/05/2015   Morbid obesity (HCC) 06/04/2015   Breast lump in female 05/17/2015   Acanthosis nigricans 03/09/2015   Allergic rhinitis, seasonal 03/09/2015   Controlled type 2 diabetes mellitus with microalbuminuria (HCC) 03/09/2015   Dyslipidemia 03/09/2015   Gastro-esophageal reflux disease without esophagitis 03/09/2015   Hiatal hernia 03/09/2015   History of anemia 03/09/2015   H/O: hysterectomy 03/09/2015   Calculus of kidney 03/09/2015   Benign hypertension 03/09/2015   Vitamin D  deficiency 03/09/2015   OSA (obstructive sleep apnea) 09/12/2014   Microalbuminuria 05/29/2014    Past Surgical History:  Procedure Laterality Date  ABDOMINAL HYSTERECTOMY  03/2012   BREAST BIOPSY Right 12/2005   Normal-Cyst   BREAST BIOPSY Right 12/2005   BREAST REDUCTION SURGERY     BREAST SURGERY Bilateral 1990   Reduction   BREAST SURGERY Bilateral 1990   COLONOSCOPY N/A 03/31/2024   Procedure:  COLONOSCOPY;  Surgeon: Maryruth Ole DASEN, MD;  Location: [facility] ENDOSCOPY;  Service: Endoscopy;  Laterality: N/A;  Mounjaro    COLONOSCOPY WITH PROPOFOL  N/A 05/07/2017   Procedure: COLONOSCOPY WITH PROPOFOL ;  Surgeon: Therisa Bi, MD;  Location: [facility] ENDOSCOPY;  Service: Gastroenterology;  Laterality: N/A;   COLONOSCOPY WITH PROPOFOL  N/A 10/14/2020   Procedure: COLONOSCOPY WITH PROPOFOL ;  Surgeon: Maryruth Ole DASEN, MD;  Location: [facility] ENDOSCOPY;  Service: Endoscopy;  Laterality: N/A;   COLONOSCOPY WITH PROPOFOL  N/A 05/24/2023   Procedure: COLONOSCOPY WITH PROPOFOL ;  Surgeon: Maryruth Ole DASEN, MD;  Location: [facility] ENDOSCOPY;  Service: Endoscopy;  Laterality: N/A;  DM   HEMOSTASIS CONTROL  05/24/2023   Procedure: HEMOSTASIS CONTROL;  Surgeon: Maryruth Ole DASEN, MD;  Location: [facility] ENDOSCOPY;  Service: Endoscopy;;   OOPHORECTOMY Left 03/2011   POLYPECTOMY  05/24/2023   Procedure: POLYPECTOMY;  Surgeon: Maryruth Ole DASEN, MD;  Location: [facility] ENDOSCOPY;  Service: Endoscopy;;   POLYPECTOMY  03/31/2024   Procedure: POLYPECTOMY, INTESTINE;  Surgeon: Maryruth Ole DASEN, MD;  Location: [facility] ENDOSCOPY;  Service: Endoscopy;;   REDUCTION MAMMAPLASTY Bilateral    REDUCTION MAMMAPLASTY Bilateral    SLEEVE GASTROPLASTY  09/2016   [place]   thyroidectomy Left 10/08/2015    Family History  Problem Relation Age of Onset   Cancer Mother 23       died complications of liver cancer   Hypertension Mother    Cirrhosis Mother    COPD Mother        Heavy Smoker   Hepatitis C Mother    Alcohol abuse Father    Heart disease Father    Heart attack Father    Diabetes Brother    Obesity Brother    Lung cancer Maternal Grandmother        Snuff   Diabetes Maternal Grandmother    Alcoholism Maternal Grandmother    Alcoholism Maternal Grandfather    Diabetes Maternal Grandfather    Prostate cancer Paternal Grandfather    Cancer Maternal Uncle        colon cancer   Cancer Cousin 9       colon cancer    Breast cancer Cousin     Social History   Tobacco Use   Smoking status: Never   Smokeless tobacco: Never  Substance Use Topics   Alcohol use: Yes    Alcohol/week: 0.0 standard drinks of alcohol    Comment: wine-social drinker     Current Outpatient Medications:    Biotin 5000 MCG TABS, Take 5,000 mg by mouth 3 (three) times a week., Disp: , Rfl:    Cholecalciferol (VITAMIN D ) 2000 units CAPS, Take 1 capsule (2,000 Units total) by mouth daily., Disp: 30 capsule, Rfl:    clobetasol  (TEMOVATE ) 0.05 % external solution, Apply once or twice daily to affected areas on scalp as needed for worse seb derm flares, Disp: 50 mL, Rfl: 5   DULoxetine  (CYMBALTA ) 60 MG capsule, Take 1 capsule (60 mg total) by mouth daily., Disp: 90 capsule, Rfl: 1   ELDERBERRY PO, Take by mouth., Disp: , Rfl:    Flaxseed, Linseed, (FLAXSEED OIL PO), Take by mouth., Disp: , Rfl:    Fluocinolone  Acetonide Scalp 0.01 % OIL, Apply once or  twice daily to scalp and ears as needed for seborrheic dermatitis flares, Disp: 120 mL, Rfl: 5   fluticasone  (FLONASE ) 50 MCG/ACT nasal spray, Place 2 sprays into both nostrils daily., Disp: 16 g, Rfl: 6   hydrocortisone  cream 1 %, Apply 1 application topically as needed for itching., Disp: , Rfl:    ketoconazole  (NIZORAL ) 2 % shampoo, 1-2 times per week lather on scalp, leave on 8-10 minutes, rinse out., Disp: 120 mL, Rfl: 11   lisinopril  (ZESTRIL ) 10 MG tablet, Take 1 tablet (10 mg total) by mouth daily., Disp: 90 tablet, Rfl: 1   loratadine (CLARITIN) 10 MG tablet, Take by mouth., Disp: , Rfl:    Multiple Vitamin (MULTIVITAMIN) tablet, Take 1 tablet by mouth daily., Disp: , Rfl:    rosuvastatin  (CRESTOR ) 10 MG tablet, Take 1 tablet (10 mg total) by mouth daily., Disp: 90 tablet, Rfl: 1   Selenium  Sulfide 2.3 % SHAM, Apply 1 each topically 2 (two) times a week., Disp: 180 mL, Rfl: 2   Thiamine  HCl (VITAMIN B-1 PO), Take 500 mg by mouth daily., Disp: , Rfl:    tirzepatide  (MOUNJARO ) 10  MG/0.5ML Pen, Inject 10 mg into the skin once a week., Disp: 6 mL, Rfl: 0   Turmeric 500 MG CAPS, Take by mouth., Disp: , Rfl:    vitamin B-12 (CYANOCOBALAMIN ) 1000 MCG tablet, Take 1,000 mcg by mouth 2 (two) times a week., Disp: , Rfl:    vitamin C (ASCORBIC ACID) 500 MG tablet, Take 500 mg by mouth daily., Disp: , Rfl:   Allergies  Allergen Reactions   Zinc     Nausea, vomiting and diarrhea    Latex Rash    I personally reviewed active problem list, medication list, allergies, family history with the patient/caregiver today.   ROS  Ten systems reviewed and is negative except as mentioned in HPI    Objective Physical Exam MEASUREMENTS: Weight- 240.6. CONSTITUTIONAL: Patient appears well-developed and well-nourished. No distress. HEENT: Head atraumatic, normocephalic, neck supple and normal. CARDIOVASCULAR: Normal rate, regular rhythm and normal heart sounds. No murmur heard. No BLE edema. PULMONARY: Effort normal and breath sounds normal. No respiratory distress. ABDOMINAL: There is no tenderness or distention. MUSCULOSKELETAL: Normal gait. Without gross motor or sensory deficit. PSYCHIATRIC: Patient has a normal mood and affect. Behavior is normal. Judgment and thought content normal.  Vitals:   04/04/24 0814  BP: 118/82  Pulse: 91  Resp: 16  Temp: 98.4 F (36.9 C)  TempSrc: Oral  SpO2: 98%  Weight: 240 lb 9.6 oz (109.1 kg)  Height: 5' 1 (1.549 m)    Body mass index is 45.46 kg/m.  Recent Results (from the past 2160 hours)  POCT glycosylated hemoglobin (Hb A1C)     Status: None   Collection Time: 04/04/24  8:19 AM  Result Value Ref Range   Hemoglobin A1C 5.6 4.0 - 5.6 %   HbA1c POC (<> result, manual entry)     HbA1c, POC (prediabetic range)     HbA1c, POC (controlled diabetic range)        PHQ2/9:    04/04/2024    8:06 AM 12/29/2023    7:57 AM 12/03/2023    7:50 AM 08/31/2023    8:12 AM 04/08/2023    8:03 AM  Depression screen PHQ 2/9  Decreased Interest  0 0 0 0 0  Down, Depressed, Hopeless 0 0 0 0 0  PHQ - 2 Score 0 0 0 0 0  Altered sleeping 0 0 0 0  0  Tired, decreased energy 0 0 0 0 0  Change in appetite 0 0 0 0 0  Feeling bad or failure about yourself  0 0 0 0 0  Trouble concentrating 0 0 0 0 0  Moving slowly or fidgety/restless 0 0 0 0 0  Suicidal thoughts 0 0 0 0 0  PHQ-9 Score 0 0 0 0 0  Difficult doing work/chores Not difficult at all Not difficult at all Not difficult at all Not difficult at all     phq 9 is negative  Fall Risk:    04/04/2024    8:06 AM 12/29/2023    7:50 AM 08/31/2023    8:08 AM 04/08/2023    8:03 AM 12/22/2022    7:49 AM  Fall Risk   Falls in the past year? 0 0 0 0 1  Number falls in past yr: 0 0 0 0 0  Injury with Fall? 0 0 0 0 0  Risk for fall due to : No Fall Risks No Fall Risks No Fall Risks No Fall Risks No Fall Risks  Follow up Falls evaluation completed Falls prevention discussed;Education provided;Falls evaluation completed Falls prevention discussed;Education provided;Falls evaluation completed Falls prevention discussed Falls prevention discussed      Assessment & Plan Obstructive sleep apnea Good compliance with CPAP therapy, reports increased energy. - Continue CPAP therapy as prescribed.  Type 2 diabetes mellitus with hypertension, morbid obesity, and dyslipidemia Diabetes well-controlled with A1c of 5.6. Weight reduced to 240.6 lbs. Blood pressure controlled with lisinopril . Dyslipidemia managed with rosuvastatin , LDL at 98. - Continue Mounjaro  10 mg. - Continue lisinopril  for hypertension. - Continue rosuvastatin  10 mg for dyslipidemia. - Encourage continued dietary changes and weight loss.  Status post bariatric surgery with history of vitamin B12, B1, and D deficiency Post-bariatric surgery with vitamin deficiencies, currently managed with supplements. Previous labs normal. - Continue vitamin D  daily. - Continue vitamin B12 and B1 weekly. - Monitor vitamin levels at next  visit.  Postprocedural hypothyroidism History of thyroidectomy, TSH not recently checked. - Order TSH test today.  Diverticulosis of sigmoid colon and recurrent colonic polyps Recent colonoscopy showed larger diverticula and three polyps. Awaiting pathology results. - Maintain high-fiber diet. - Await pathology results from recent colonoscopy.  Internal hemorrhoids Internal hemorrhoids noted during colonoscopy, advised bright red blood could be from hemorrhoids. - Monitor for symptoms of bleeding.  Seborrheic keratosis  Chronic seborrheic dermatitis, mild on face, worse on scalp. Using ketoconazole  shampoo, fluocinolone  scalp oil, and clobetasol  solution. - Continue ketoconazole  shampoo 1-2 times a week. - Use fluocinolone  scalp oil at night before shampooing. - Continue clobetasol  solution as needed.  Skin tags Multiple skin tags, some previously removed by dermatologist. - Follow up with dermatologist in November for skin tag removal.  Acanthosis nigricans Pigmentation on neck associated with insulin resistance, may improve with diabetes control and weight loss. - Continue diabetes management and weight loss efforts.  Resolving phlebitis Phlebitis after blood draw, small knot resolving.

## 2024-04-05 ENCOUNTER — Ambulatory Visit: Payer: Self-pay | Admitting: Family Medicine

## 2024-04-05 LAB — TSH: TSH: 1.96 m[IU]/L (ref 0.40–4.50)

## 2024-04-05 LAB — HEPATITIS B SURFACE ANTIBODY,QUALITATIVE: Hep B S Ab: NONREACTIVE

## 2024-04-06 ENCOUNTER — Other Ambulatory Visit
Admission: RE | Admit: 2024-04-06 | Discharge: 2024-04-06 | Disposition: A | Discharge status: Home / Self Care | Payer: Self-pay | Source: Ambulatory Visit | Attending: Medical Genetics | Admitting: Medical Genetics

## 2024-04-16 LAB — GENECONNECT MOLECULAR SCREEN: Genetic Analysis Overall Interpretation: NEGATIVE

## 2024-05-24 ENCOUNTER — Other Ambulatory Visit: Payer: Self-pay | Admitting: Family Medicine

## 2024-05-24 DIAGNOSIS — F341 Dysthymic disorder: Secondary | ICD-10-CM

## 2024-06-18 ENCOUNTER — Other Ambulatory Visit: Payer: Self-pay | Admitting: Family Medicine

## 2024-06-18 DIAGNOSIS — E1169 Type 2 diabetes mellitus with other specified complication: Secondary | ICD-10-CM

## 2024-06-18 DIAGNOSIS — E119 Type 2 diabetes mellitus without complications: Secondary | ICD-10-CM

## 2024-06-19 ENCOUNTER — Other Ambulatory Visit: Payer: Self-pay | Admitting: Family Medicine

## 2024-06-19 DIAGNOSIS — E1169 Type 2 diabetes mellitus with other specified complication: Secondary | ICD-10-CM

## 2024-06-20 ENCOUNTER — Ambulatory Visit (INDEPENDENT_AMBULATORY_CARE_PROVIDER_SITE_OTHER): Admitting: Dermatology

## 2024-06-20 DIAGNOSIS — L918 Other hypertrophic disorders of the skin: Secondary | ICD-10-CM

## 2024-06-20 NOTE — Telephone Encounter (Signed)
 Requested medications are due for refill today.  yes  Requested medications are on the active medications list.  yes  Last refill. 04/04/2024 6mL 0 rf  Future visit scheduled.   yes  Notes to clinic.  Medication not assigned to a protocol. Please review for refill.    Requested Prescriptions  Pending Prescriptions Disp Refills   MOUNJARO  10 MG/0.5ML Pen [Pharmacy Med Name: Mounjaro  10 MG/0.5ML Subcutaneous Solution Auto-injector] 12 mL 0    Sig: INJECT ONE SYRINGEFUL (10MG ) INTO THE SKIN ONCE WEEKLY     Off-Protocol Failed - 06/20/2024  2:50 PM      Failed - Medication not assigned to a protocol, review manually.      Passed - Valid encounter within last 12 months    Recent Outpatient Visits           2 months ago Dyslipidemia associated with type 2 diabetes mellitus [facility])   [place] [facility] Glenard Mire, MD   5 months ago Obesity, diabetes, and hypertension syndrome [facility])   [place] [facility] Sowles, Krichna, MD   6 months ago Well adult exam   [facility] Sowles, Krichna, MD       Future Appointments             Today Jackquline Sawyer, MD [facility]   In 2 weeks Sowles, Krichna, MD [facility], [place]   In 5 months Sowles, Krichna, MD [facility], [place]

## 2024-06-20 NOTE — Telephone Encounter (Signed)
 Requested Prescriptions  Pending Prescriptions Disp Refills   rosuvastatin  (CRESTOR ) 10 MG tablet [Pharmacy Med Name: Rosuvastatin  Calcium  10 MG Oral Tablet] 90 tablet 1    Sig: Take 1 tablet by mouth once daily     Cardiovascular:  Antilipid - Statins 2 Failed - 06/20/2024  5:07 PM      Failed - Lipid Panel in normal range within the last 12 months    Cholesterol, Total  Date Value Ref Range Status  06/04/2015 160 100 - 199 mg/dL Final   Cholesterol  Date Value Ref Range Status  12/03/2023 177 <200 mg/dL Final   LDL Cholesterol (Calc)  Date Value Ref Range Status  12/03/2023 98 mg/dL (calc) Final    Comment:    Reference range: <100 . Desirable range <100 mg/dL for primary prevention;   <70 mg/dL for patients with CHD or diabetic patients  with > or = 2 CHD risk factors. SABRA LDL-C is now calculated using the Martin-Hopkins  calculation, which is a validated novel method providing  better accuracy than the Friedewald equation in the  estimation of LDL-C.  Gladis APPLETHWAITE et al. SANDREA. 7986;689(80): 2061-2068  (http://education.[employer].com/faq/FAQ164)    HDL  Date Value Ref Range Status  12/03/2023 46 (L) > OR = 50 mg/dL Final  88/98/7983 40 >60 mg/dL Final    Comment:    According to ATP-III Guidelines, HDL-C >59 mg/dL is considered a negative risk factor for CHD.    Triglycerides  Date Value Ref Range Status  12/03/2023 210 (H) <150 mg/dL Final    Comment:    . If a non-fasting specimen was collected, consider repeat triglyceride testing on a fasting specimen if clinically indicated.  Veatrice et al. J. of Clin. Lipidol. 2015;9:129-169. SABRA          Passed - Cr in normal range and within 360 days    Creat  Date Value Ref Range Status  12/03/2023 0.73 0.50 - 1.03 mg/dL Final   Creatinine, Urine  Date Value Ref Range Status  12/03/2023 246 20 - 275 mg/dL Final         Passed - Patient is not pregnant      Passed - Valid encounter within last 12 months     Recent Outpatient Visits           2 months ago Dyslipidemia associated with type 2 diabetes mellitus [facility])   [place] [facility] Sowles, Krichna, MD   5 months ago Obesity, diabetes, and hypertension syndrome [facility])   [place] [facility] Sowles, Krichna, MD   6 months ago Well adult exam   [facility] [place] Mire, MD       Future Appointments             In 2 weeks Sowles, Krichna, MD [facility], [place]   In 5 months Sowles, Krichna, MD [facility], [place]

## 2024-06-20 NOTE — Patient Instructions (Signed)

## 2024-06-20 NOTE — Progress Notes (Signed)
   Follow-Up Visit   Subjective  Debra Contreras  is a 58 y.o. female who presents for the following: skin tags around the neck, and inframammary areas, pt would like removed today.   The following portions of the chart were reviewed this encounter and updated as appropriate: medications, allergies, medical history  Review of Systems:  No other skin or systemic complaints except as noted in HPI or Assessment and Plan.  Objective  Well appearing patient in no apparent distress; mood and affect are within normal limits.   A focused examination was performed of the following areas: the face, neck, and inframammary area   Relevant exam findings are noted in the Assessment and Plan.  abdomen x 5, L axilla x 2, neck x 4 (11) Fleshy, skin-colored pedunculated papules.    Assessment & Plan  SKIN TAG (11) abdomen x 5, L axilla x 2, neck x 4 (11) Procedure: Skin tag removal Informed consent:  Discussed risks (permanent scarring, infection, pain, bleeding, bruising, redness, and recurrence of the lesion) and benefits of the procedure, as well as the alternatives.  She is aware that skin tags are benign lesions, and their removal is often not considered medically necessary.  Informed consent was obtained.  Pt chooses to pay cosmetic fee for skin tag removal- $115 up to 15 removed. Anesthesia: lidocaine /epinephrine  The area was prepared and draped in a standard fashion. Snip removal was performed.   Antibiotic ointment and a sterile dressing were applied.   The patient tolerated procedure well. The patient was instructed on post-op care.   Number of lesions removed:  11   Return if symptoms worsen or fail to improve.  LILLETTE Rosina Mayans, CMA, am acting as scribe for Rexene Rattler, MD .   Documentation: I have reviewed the above documentation for accuracy and completeness, and I agree with the above.  Rexene Rattler, MD

## 2024-07-05 ENCOUNTER — Ambulatory Visit (INDEPENDENT_AMBULATORY_CARE_PROVIDER_SITE_OTHER): Admitting: Family Medicine

## 2024-07-05 ENCOUNTER — Encounter: Payer: Self-pay | Admitting: Family Medicine

## 2024-07-05 VITALS — BP 128/82 | HR 78 | Resp 16 | Ht 61.0 in | Wt 238.5 lb

## 2024-07-05 DIAGNOSIS — E785 Hyperlipidemia, unspecified: Secondary | ICD-10-CM | POA: Diagnosis not present

## 2024-07-05 DIAGNOSIS — E519 Thiamine deficiency, unspecified: Secondary | ICD-10-CM

## 2024-07-05 DIAGNOSIS — E538 Deficiency of other specified B group vitamins: Secondary | ICD-10-CM

## 2024-07-05 DIAGNOSIS — F341 Dysthymic disorder: Secondary | ICD-10-CM | POA: Diagnosis not present

## 2024-07-05 DIAGNOSIS — E119 Type 2 diabetes mellitus without complications: Secondary | ICD-10-CM

## 2024-07-05 DIAGNOSIS — Z7985 Long-term (current) use of injectable non-insulin antidiabetic drugs: Secondary | ICD-10-CM

## 2024-07-05 DIAGNOSIS — E559 Vitamin D deficiency, unspecified: Secondary | ICD-10-CM

## 2024-07-05 DIAGNOSIS — I10 Essential (primary) hypertension: Secondary | ICD-10-CM | POA: Diagnosis not present

## 2024-07-05 DIAGNOSIS — Z9884 Bariatric surgery status: Secondary | ICD-10-CM | POA: Diagnosis not present

## 2024-07-05 DIAGNOSIS — G4733 Obstructive sleep apnea (adult) (pediatric): Secondary | ICD-10-CM | POA: Diagnosis not present

## 2024-07-05 DIAGNOSIS — E1169 Type 2 diabetes mellitus with other specified complication: Secondary | ICD-10-CM

## 2024-07-05 LAB — POCT GLYCOSYLATED HEMOGLOBIN (HGB A1C): Hemoglobin A1C: 5.8 % — AB (ref 4.0–5.6)

## 2024-07-05 MED ORDER — LISINOPRIL 10 MG PO TABS: 10.0000 mg | ORAL_TABLET | Freq: Every day | ORAL | 1 refills | Status: AC

## 2024-07-05 MED ORDER — DULOXETINE HCL 60 MG PO CPEP: 60.0000 mg | ORAL_CAPSULE | Freq: Every day | ORAL | 0 refills | Status: AC

## 2024-07-05 MED ORDER — TIRZEPATIDE 10 MG/0.5ML ~~LOC~~ SOAJ: 10.0000 mg | SUBCUTANEOUS | 3 refills | Status: AC

## 2024-07-05 NOTE — Progress Notes (Signed)
 Name: Debra Contreras    MRN: [id number]    DOB: 1965-12-21   Date:07/05/2024       Progress Note  Subjective  Chief Complaint  Chief Complaint  Patient presents with   Medical Management of Chronic Issues   Discussed the use of AI scribe software for clinical note transcription with the patient, who gave verbal consent to proceed.  History of Present Illness Debra Contreras  is a 58 year old female who presents for a three-month follow-up.  She has been feeling well overall and has been losing weight with the use of Mounjaro , a GLP-1 agonist. However, she ran out of the prescription two weeks ago due to unavailability at the pharmacy. While on the medication, she noticed a change in appetite. She has been cooking more at home and purchasing healthier snacks, but her BMI remains over 45.  She denies experiencing polyphagia, polydipsia, or polyuria. She is currently taking atorvastatin and rosuvastatin  10 mg for dyslipidemia, which she is tolerating well, and her last LDL levels had improved. She also takes lisinopril  10 mg for hypertension and denies any side effects such as chest pain, palpitations, or dizziness.  She has a history of bariatric surgery and continues to take multivitamins and supplements, including vitamin D , to address B12 and B1 deficiencies. She is up to date with her eye exams.   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   History of partial thyroidectomy 10/05/2015   Morbid obesity (HCC) 06/04/2015   Breast lump in female 05/17/2015    Acanthosis nigricans 03/09/2015   Allergic rhinitis, seasonal 03/09/2015   Controlled type 2 diabetes mellitus with microalbuminuria (HCC) 03/09/2015   Dyslipidemia 03/09/2015   Gastro-esophageal reflux disease without esophagitis 03/09/2015   Hiatal hernia 03/09/2015   History of anemia 03/09/2015   H/O: hysterectomy 03/09/2015   Calculus of kidney 03/09/2015   Benign hypertension 03/09/2015   Vitamin D  deficiency 03/09/2015   OSA (obstructive sleep apnea) 09/12/2014   Microalbuminuria 05/29/2014    Past Surgical History:  Procedure Laterality Date   ABDOMINAL HYSTERECTOMY  03/2012   BREAST BIOPSY Right 12/2005   Normal-Cyst   BREAST BIOPSY Right 12/2005   BREAST REDUCTION SURGERY     BREAST SURGERY Bilateral 1990   Reduction   BREAST SURGERY Bilateral 1990   COLONOSCOPY N/A 03/31/2024   Procedure: COLONOSCOPY;  Surgeon: Maryruth Ole DASEN, MD;  Location: [facility] ENDOSCOPY;  Service: Endoscopy;  Laterality: N/A;  Mounjaro    COLONOSCOPY WITH PROPOFOL  N/A 05/07/2017   Procedure: COLONOSCOPY WITH PROPOFOL ;  Surgeon: Therisa Bi, MD;  Location: [facility] ENDOSCOPY;  Service: Gastroenterology;  Laterality: N/A;   COLONOSCOPY WITH PROPOFOL  N/A 10/14/2020   Procedure: COLONOSCOPY WITH PROPOFOL ;  Surgeon: Maryruth Ole DASEN, MD;  Location: [facility] ENDOSCOPY;  Service: Endoscopy;  Laterality: N/A;   COLONOSCOPY WITH PROPOFOL  N/A 05/24/2023   Procedure: COLONOSCOPY WITH PROPOFOL ;  Surgeon: Maryruth Ole DASEN, MD;  Location: [facility] ENDOSCOPY;  Service: Endoscopy;  Laterality: N/A;  DM   HEMOSTASIS CONTROL  05/24/2023   Procedure: HEMOSTASIS CONTROL;  Surgeon: Maryruth Ole DASEN, MD;  Location: [facility] ENDOSCOPY;  Service: Endoscopy;;  OOPHORECTOMY Left 03/2011   POLYPECTOMY  05/24/2023   Procedure: POLYPECTOMY;  Surgeon: Maryruth Ole DASEN, MD;  Location: [facility] ENDOSCOPY;  Service: Endoscopy;;   POLYPECTOMY  03/31/2024   Procedure: POLYPECTOMY, INTESTINE;  Surgeon: Maryruth Ole DASEN, MD;  Location:  [facility] ENDOSCOPY;  Service: Endoscopy;;   REDUCTION MAMMAPLASTY Bilateral    REDUCTION MAMMAPLASTY Bilateral    SLEEVE GASTROPLASTY  09/2016   [place]   thyroidectomy Left 10/08/2015    Family History  Problem Relation Age of Onset   Cancer Mother 42       died complications of liver cancer   Hypertension Mother    Cirrhosis Mother    COPD Mother        Heavy Smoker   Hepatitis C Mother    Alcohol abuse Father    Heart disease Father    Heart attack Father    Diabetes Brother    Obesity Brother    Lung cancer Maternal Grandmother        Snuff   Diabetes Maternal Grandmother    Alcoholism Maternal Grandmother    Alcoholism Maternal Grandfather    Diabetes Maternal Grandfather    Prostate cancer Paternal Grandfather    Cancer Maternal Uncle        colon cancer   Cancer Cousin 98       colon cancer   Breast cancer Cousin     Social History   Tobacco Use   Smoking status: Never   Smokeless tobacco: Never  Substance Use Topics   Alcohol use: Yes    Alcohol/week: 0.0 standard drinks of alcohol    Comment: wine-social drinker     Current Outpatient Medications:    Biotin 5000 MCG TABS, Take 5,000 mg by mouth 3 (three) times a week., Disp: , Rfl:    Cholecalciferol (VITAMIN D ) 2000 units CAPS, Take 1 capsule (2,000 Units total) by mouth daily., Disp: 30 capsule, Rfl:    clobetasol  (TEMOVATE ) 0.05 % external solution, Apply once or twice daily to affected areas on scalp as needed for worse seb derm flares, Disp: 50 mL, Rfl: 5   ELDERBERRY PO, Take by mouth., Disp: , Rfl:    Flaxseed, Linseed, (FLAXSEED OIL PO), Take by mouth., Disp: , Rfl:    Fluocinolone  Acetonide Scalp 0.01 % OIL, Apply once or twice daily to scalp and ears as needed for seborrheic dermatitis flares, Disp: 120 mL, Rfl: 5   fluticasone  (FLONASE ) 50 MCG/ACT nasal spray, Place 2 sprays into both nostrils daily., Disp: 16 g, Rfl: 6   hydrocortisone  cream 1 %, Apply 1 application topically as needed for  itching., Disp: , Rfl:    ketoconazole  (NIZORAL ) 2 % shampoo, 1-2 times per week lather on scalp, leave on 8-10 minutes, rinse out., Disp: 120 mL, Rfl: 11   loratadine (CLARITIN) 10 MG tablet, Take by mouth., Disp: , Rfl:    Multiple Vitamin (MULTIVITAMIN) tablet, Take 1 tablet by mouth daily., Disp: , Rfl:    rosuvastatin  (CRESTOR ) 10 MG tablet, Take 1 tablet by mouth once daily, Disp: 90 tablet, Rfl: 1   Selenium  Sulfide 2.3 % SHAM, Apply 1 each topically 2 (two) times a week., Disp: 180 mL, Rfl: 2   Thiamine  HCl (VITAMIN B-1 PO), Take 500 mg by mouth daily., Disp: , Rfl:    Turmeric 500 MG CAPS, Take by mouth., Disp: , Rfl:    vitamin B-12 (CYANOCOBALAMIN ) 1000 MCG tablet, Take 1,000 mcg by mouth 2 (two) times a week., Disp: , Rfl:  vitamin C (ASCORBIC ACID) 500 MG tablet, Take 500 mg by mouth daily., Disp: , Rfl:    DULoxetine  (CYMBALTA ) 60 MG capsule, Take 1 capsule (60 mg total) by mouth daily., Disp: 90 capsule, Rfl: 0   lisinopril  (ZESTRIL ) 10 MG tablet, Take 1 tablet (10 mg total) by mouth daily., Disp: 90 tablet, Rfl: 1   tirzepatide  (MOUNJARO ) 10 MG/0.5ML Pen, Inject 10 mg into the skin once a week., Disp: 2 mL, Rfl: 3  Allergies  Allergen Reactions   Zinc     Nausea, vomiting and diarrhea    Latex Rash    I personally reviewed active problem list, medication list, allergies, family history with the patient/caregiver today.   ROS  Ten systems reviewed and is negative except as mentioned in HPI    Objective Physical Exam CONSTITUTIONAL: Patient appears well-developed and well-nourished.  No distress. HEENT: Head atraumatic, normocephalic, neck supple. CARDIOVASCULAR: Normal rate, regular rhythm and normal heart sounds.  No murmur heard. No BLE edema. PULMONARY: Effort normal and breath sounds normal. No respiratory distress. ABDOMINAL: There is no tenderness or distention. MUSCULOSKELETAL: Normal gait. Without gross motor or sensory deficit. PSYCHIATRIC: Patient has a  normal mood and affect. behavior is normal. Judgment and thought content normal.  Vitals:   07/05/24 0824  BP: 128/82  Pulse: 78  Resp: 16  SpO2: 98%  Weight: 238 lb 8 oz (108.2 kg)  Height: 5' 1 (1.549 m)    Body mass index is 45.06 kg/m.  Recent Results (from the past 2160 hours)  POCT glycosylated hemoglobin (Hb A1C)     Status: Abnormal   Collection Time: 07/05/24  8:28 AM  Result Value Ref Range   Hemoglobin A1C 5.8 (A) 4.0 - 5.6 %   HbA1c POC (<> result, manual entry)     HbA1c, POC (prediabetic range)     HbA1c, POC (controlled diabetic range)      Diabetic Foot Exam:     PHQ2/9:    07/05/2024    8:16 AM 04/04/2024    8:06 AM 12/29/2023    7:57 AM 12/03/2023    7:50 AM 08/31/2023    8:12 AM  Depression screen PHQ 2/9  Decreased Interest 0 0 0 0 0  Down, Depressed, Hopeless 0 0 0 0 0  PHQ - 2 Score 0 0 0 0 0  Altered sleeping 0 0 0 0 0  Tired, decreased energy 0 0 0 0 0  Change in appetite 0 0 0 0 0  Feeling bad or failure about yourself  0 0 0 0 0  Trouble concentrating 0 0 0 0 0  Moving slowly or fidgety/restless 0 0 0 0 0  Suicidal thoughts 0 0 0 0 0  PHQ-9 Score 0 0  0  0  0   Difficult doing work/chores Not difficult at all Not difficult at all Not difficult at all Not difficult at all Not difficult at all     Data saved with a previous flowsheet row definition    phq 9 is negative  Fall Risk:    07/05/2024    8:16 AM 04/04/2024    8:06 AM 12/29/2023    7:50 AM 08/31/2023    8:08 AM 04/08/2023    8:03 AM  Fall Risk   Falls in the past year? 0 0 0 0 0  Number falls in past yr: 0 0 0 0 0  Injury with Fall? 0 0  0  0  0   Risk for fall  due to : No Fall Risks No Fall Risks No Fall Risks No Fall Risks No Fall Risks  Follow up Falls evaluation completed Falls evaluation completed Falls prevention discussed;Education provided;Falls evaluation completed Falls prevention discussed;Education provided;Falls evaluation completed Falls prevention discussed      Data saved with a previous flowsheet row definition    Assessment & Plan Morbid obesity BMI >45. Active weight loss efforts with lifestyle changes and GLP-1 agonist therapy. Reports weight loss and appetite changes with Mounjaro , but prescription lapsed due to pharmacy issues. - Continue GLP-1 agonist therapy. - Encouraged continued lifestyle modifications for weight loss.  Type 2 diabetes mellitus , obesity, HTN syndrome No symptoms of polyphagia, polydipsia, or polyuria. Weight loss and lifestyle changes may benefit diabetes management.  Obstructive sleep apnea Very compliant with CPAP and feels like she has more energy since started therapy   Bariatric surgery status Post-bariatric surgery. Continues multivitamins and supplements. - Continue multivitamins and supplements as indicated.  Vitamin B12 deficiency Continues vitamin B12 supplementation. - Continue vitamin B12 supplementation.  Thiamine  deficiency Continues vitamin B1 supplementation. - Continue vitamin B1 supplementation.  Vitamin D  deficiency Continues vitamin D  supplementation. - Continue vitamin D  supplementation.  Dyslipidemia associated with type 2 diabetes  Managed with rosuvastatin  10 mg. Improved LDL levels. Tolerating medication well. - Continue rosuvastatin  10 mg.  Hypertension Managed with lisinopril  10 mg. No side effects reported. - Continue lisinopril  10 mg.

## 2024-11-07 ENCOUNTER — Ambulatory Visit: Admitting: Family Medicine

## 2024-12-05 ENCOUNTER — Encounter: Admitting: Family Medicine
